# Patient Record
Sex: Male | Born: 1935
Health system: Southern US, Community
[De-identification: ages and names within clinical notes are randomized; demographics above are authoritative.]

## PROBLEM LIST (undated history)

## (undated) DIAGNOSIS — N400 Enlarged prostate without lower urinary tract symptoms: Secondary | ICD-10-CM

## (undated) DIAGNOSIS — E785 Hyperlipidemia, unspecified: Secondary | ICD-10-CM

## (undated) DIAGNOSIS — C801 Malignant (primary) neoplasm, unspecified: Secondary | ICD-10-CM

## (undated) DIAGNOSIS — J189 Pneumonia, unspecified organism: Secondary | ICD-10-CM

## (undated) DIAGNOSIS — T7840XA Allergy, unspecified, initial encounter: Secondary | ICD-10-CM

## (undated) DIAGNOSIS — I10 Essential (primary) hypertension: Secondary | ICD-10-CM

## (undated) DIAGNOSIS — M1990 Unspecified osteoarthritis, unspecified site: Secondary | ICD-10-CM

## (undated) HISTORY — DX: Benign prostatic hyperplasia without lower urinary tract symptoms: N40.0

## (undated) HISTORY — DX: Hyperlipidemia, unspecified: E78.5

## (undated) HISTORY — DX: Allergy, unspecified, initial encounter: T78.40XA

## (undated) HISTORY — PX: TONSILLECTOMY AND ADENOIDECTOMY: SUR1326

## (undated) HISTORY — PX: VENTRAL HERNIA REPAIR: SHX424

## (undated) HISTORY — DX: Essential (primary) hypertension: I10

## (undated) HISTORY — PX: PROSTATE BIOPSY: SHX241

## (undated) HISTORY — PX: COLONOSCOPY: SHX174

## (undated) HISTORY — PX: CATARACT EXTRACTION W/ INTRAOCULAR LENS IMPLANT: SHX1309

---

## 1996-11-14 ENCOUNTER — Encounter (INDEPENDENT_AMBULATORY_CARE_PROVIDER_SITE_OTHER): Payer: Self-pay | Admitting: Internal Medicine

## 1996-11-14 LAB — CONVERTED CEMR LAB: PSA: 0.5 ng/mL

## 1998-10-15 ENCOUNTER — Encounter (INDEPENDENT_AMBULATORY_CARE_PROVIDER_SITE_OTHER): Payer: Self-pay | Admitting: Internal Medicine

## 2000-07-16 ENCOUNTER — Encounter (INDEPENDENT_AMBULATORY_CARE_PROVIDER_SITE_OTHER): Payer: Self-pay | Admitting: Internal Medicine

## 2000-07-16 LAB — CONVERTED CEMR LAB: Hgb A1c MFr Bld: 8 %

## 2001-04-16 ENCOUNTER — Encounter (INDEPENDENT_AMBULATORY_CARE_PROVIDER_SITE_OTHER): Payer: Self-pay | Admitting: Internal Medicine

## 2001-04-16 LAB — CONVERTED CEMR LAB: Hgb A1c MFr Bld: 6.5 %

## 2002-02-13 ENCOUNTER — Encounter (INDEPENDENT_AMBULATORY_CARE_PROVIDER_SITE_OTHER): Payer: Self-pay | Admitting: Internal Medicine

## 2002-02-13 LAB — CONVERTED CEMR LAB: Hgb A1c MFr Bld: 6.3 %

## 2002-06-16 ENCOUNTER — Encounter (INDEPENDENT_AMBULATORY_CARE_PROVIDER_SITE_OTHER): Payer: Self-pay | Admitting: Internal Medicine

## 2002-06-16 LAB — CONVERTED CEMR LAB: Hgb A1c MFr Bld: 6.3 %

## 2002-12-15 ENCOUNTER — Encounter (INDEPENDENT_AMBULATORY_CARE_PROVIDER_SITE_OTHER): Payer: Self-pay | Admitting: Internal Medicine

## 2003-05-17 ENCOUNTER — Encounter (INDEPENDENT_AMBULATORY_CARE_PROVIDER_SITE_OTHER): Payer: Self-pay | Admitting: Internal Medicine

## 2003-08-17 ENCOUNTER — Encounter (INDEPENDENT_AMBULATORY_CARE_PROVIDER_SITE_OTHER): Payer: Self-pay | Admitting: Internal Medicine

## 2003-09-17 ENCOUNTER — Encounter (INDEPENDENT_AMBULATORY_CARE_PROVIDER_SITE_OTHER): Payer: Self-pay | Admitting: Internal Medicine

## 2003-09-17 LAB — CONVERTED CEMR LAB: Microalbumin U total vol: 2.1 mg/L

## 2003-12-15 ENCOUNTER — Encounter (INDEPENDENT_AMBULATORY_CARE_PROVIDER_SITE_OTHER): Payer: Self-pay | Admitting: Internal Medicine

## 2004-06-16 ENCOUNTER — Encounter (INDEPENDENT_AMBULATORY_CARE_PROVIDER_SITE_OTHER): Payer: Self-pay | Admitting: Internal Medicine

## 2004-06-16 LAB — CONVERTED CEMR LAB: Hgb A1c MFr Bld: 6.5 %

## 2004-06-19 ENCOUNTER — Ambulatory Visit: Payer: Self-pay | Admitting: Internal Medicine

## 2004-07-27 ENCOUNTER — Ambulatory Visit: Payer: Self-pay | Admitting: Family Medicine

## 2004-10-14 ENCOUNTER — Encounter (INDEPENDENT_AMBULATORY_CARE_PROVIDER_SITE_OTHER): Payer: Self-pay | Admitting: Internal Medicine

## 2004-10-27 ENCOUNTER — Ambulatory Visit: Payer: Self-pay | Admitting: Family Medicine

## 2004-12-02 ENCOUNTER — Ambulatory Visit: Payer: Self-pay | Admitting: Family Medicine

## 2005-03-03 ENCOUNTER — Ambulatory Visit: Payer: Self-pay | Admitting: Family Medicine

## 2005-04-05 ENCOUNTER — Ambulatory Visit: Payer: Self-pay | Admitting: Family Medicine

## 2005-05-25 ENCOUNTER — Ambulatory Visit: Payer: Self-pay | Admitting: Family Medicine

## 2005-06-17 ENCOUNTER — Ambulatory Visit: Payer: Self-pay

## 2005-07-19 ENCOUNTER — Ambulatory Visit: Payer: Self-pay | Admitting: Family Medicine

## 2005-09-16 ENCOUNTER — Encounter (INDEPENDENT_AMBULATORY_CARE_PROVIDER_SITE_OTHER): Payer: Self-pay | Admitting: Internal Medicine

## 2005-09-16 LAB — CONVERTED CEMR LAB: PSA: 0.42 ng/mL

## 2005-09-21 ENCOUNTER — Ambulatory Visit: Payer: Self-pay | Admitting: Family Medicine

## 2005-10-14 ENCOUNTER — Encounter (INDEPENDENT_AMBULATORY_CARE_PROVIDER_SITE_OTHER): Payer: Self-pay | Admitting: Internal Medicine

## 2005-10-14 LAB — CONVERTED CEMR LAB: Hgb A1c MFr Bld: 6.5 %

## 2005-10-21 ENCOUNTER — Ambulatory Visit: Payer: Self-pay | Admitting: Family Medicine

## 2006-01-17 ENCOUNTER — Ambulatory Visit: Payer: Self-pay | Admitting: Family Medicine

## 2006-02-23 ENCOUNTER — Ambulatory Visit: Payer: Self-pay | Admitting: Family Medicine

## 2006-06-16 ENCOUNTER — Encounter (INDEPENDENT_AMBULATORY_CARE_PROVIDER_SITE_OTHER): Payer: Self-pay | Admitting: Internal Medicine

## 2006-06-16 LAB — CONVERTED CEMR LAB: Microalbumin U total vol: 11.3 mg/L

## 2006-07-19 ENCOUNTER — Ambulatory Visit: Payer: Self-pay | Admitting: Internal Medicine

## 2006-10-20 ENCOUNTER — Encounter (INDEPENDENT_AMBULATORY_CARE_PROVIDER_SITE_OTHER): Payer: Self-pay | Admitting: Internal Medicine

## 2006-10-20 DIAGNOSIS — E1149 Type 2 diabetes mellitus with other diabetic neurological complication: Secondary | ICD-10-CM | POA: Insufficient documentation

## 2006-10-20 DIAGNOSIS — E1169 Type 2 diabetes mellitus with other specified complication: Secondary | ICD-10-CM | POA: Insufficient documentation

## 2006-10-20 DIAGNOSIS — I10 Essential (primary) hypertension: Secondary | ICD-10-CM | POA: Insufficient documentation

## 2006-10-20 DIAGNOSIS — E785 Hyperlipidemia, unspecified: Secondary | ICD-10-CM

## 2006-11-17 ENCOUNTER — Ambulatory Visit: Payer: Self-pay | Admitting: Family Medicine

## 2007-01-19 ENCOUNTER — Ambulatory Visit: Payer: Self-pay | Admitting: Family Medicine

## 2007-01-20 LAB — CONVERTED CEMR LAB
ALT: 18 units/L (ref 0–40)
AST: 29 units/L (ref 0–37)
BUN: 19 mg/dL (ref 6–23)
CO2: 26 meq/L (ref 19–32)
Calcium: 9.3 mg/dL (ref 8.4–10.5)
Chloride: 105 meq/L (ref 96–112)
Creatinine, Ser: 1.3 mg/dL (ref 0.4–1.5)
Hgb A1c MFr Bld: 7 % — ABNORMAL HIGH (ref 4.6–6.0)
PSA: 0.36 ng/mL (ref 0.10–4.00)
Potassium: 3.9 meq/L (ref 3.5–5.1)
TSH: 0.51 microintl units/mL (ref 0.35–5.50)
Triglycerides: 71 mg/dL (ref 0–149)
VLDL: 14 mg/dL (ref 0–40)

## 2007-07-26 ENCOUNTER — Ambulatory Visit: Payer: Self-pay | Admitting: Family Medicine

## 2007-07-26 DIAGNOSIS — K5900 Constipation, unspecified: Secondary | ICD-10-CM | POA: Insufficient documentation

## 2007-07-26 DIAGNOSIS — K5909 Other constipation: Secondary | ICD-10-CM | POA: Insufficient documentation

## 2007-07-27 LAB — CONVERTED CEMR LAB
ALT: 20 units/L (ref 0–53)
AST: 24 units/L (ref 0–37)
BUN: 16 mg/dL (ref 6–23)
CO2: 28 meq/L (ref 19–32)
Calcium: 9.5 mg/dL (ref 8.4–10.5)
Chloride: 105 meq/L (ref 96–112)
Cholesterol: 160 mg/dL (ref 0–200)
Creatinine, Ser: 1.3 mg/dL (ref 0.4–1.5)
GFR calc Af Amer: 70 mL/min
Glucose, Bld: 148 mg/dL — ABNORMAL HIGH (ref 70–99)

## 2007-08-08 ENCOUNTER — Ambulatory Visit: Payer: Self-pay | Admitting: Family Medicine

## 2007-08-08 DIAGNOSIS — H6120 Impacted cerumen, unspecified ear: Secondary | ICD-10-CM | POA: Insufficient documentation

## 2007-08-21 ENCOUNTER — Ambulatory Visit: Payer: Self-pay | Admitting: Gastroenterology

## 2007-08-31 ENCOUNTER — Encounter (INDEPENDENT_AMBULATORY_CARE_PROVIDER_SITE_OTHER): Payer: Self-pay | Admitting: Internal Medicine

## 2007-08-31 ENCOUNTER — Encounter: Payer: Self-pay | Admitting: Gastroenterology

## 2007-08-31 ENCOUNTER — Ambulatory Visit: Payer: Self-pay | Admitting: Gastroenterology

## 2007-09-04 DIAGNOSIS — D126 Benign neoplasm of colon, unspecified: Secondary | ICD-10-CM | POA: Insufficient documentation

## 2007-10-31 ENCOUNTER — Ambulatory Visit: Payer: Self-pay | Admitting: Family Medicine

## 2007-11-02 LAB — CONVERTED CEMR LAB
Creatinine,U: 104 mg/dL
Hgb A1c MFr Bld: 6.6 % — ABNORMAL HIGH (ref 4.6–6.0)
Microalb Creat Ratio: 24 mg/g (ref 0.0–30.0)

## 2007-11-10 ENCOUNTER — Ambulatory Visit: Payer: Self-pay | Admitting: Family Medicine

## 2008-04-30 ENCOUNTER — Ambulatory Visit: Payer: Self-pay | Admitting: Family Medicine

## 2008-04-30 DIAGNOSIS — J309 Allergic rhinitis, unspecified: Secondary | ICD-10-CM | POA: Insufficient documentation

## 2008-05-01 LAB — CONVERTED CEMR LAB
ALT: 23 units/L (ref 0–53)
AST: 23 units/L (ref 0–37)
CO2: 29 meq/L (ref 19–32)
Calcium: 9.4 mg/dL (ref 8.4–10.5)
GFR calc Af Amer: 85 mL/min
Glucose, Bld: 138 mg/dL — ABNORMAL HIGH (ref 70–99)
HDL: 47.9 mg/dL (ref >39.0)
Hgb A1c MFr Bld: 6.9 % — ABNORMAL HIGH (ref 4.6–6.0)
Potassium: 4.2 meq/L (ref 3.5–5.1)
Sodium: 139 meq/L (ref 135–145)
Total CHOL/HDL Ratio: 2.5

## 2008-06-13 ENCOUNTER — Ambulatory Visit: Payer: Self-pay | Admitting: Family Medicine

## 2008-10-30 ENCOUNTER — Ambulatory Visit: Payer: Self-pay | Admitting: Family Medicine

## 2008-11-01 ENCOUNTER — Encounter (INDEPENDENT_AMBULATORY_CARE_PROVIDER_SITE_OTHER): Payer: Self-pay | Admitting: Internal Medicine

## 2008-11-01 ENCOUNTER — Telehealth (INDEPENDENT_AMBULATORY_CARE_PROVIDER_SITE_OTHER): Payer: Self-pay | Admitting: Internal Medicine

## 2008-11-05 ENCOUNTER — Ambulatory Visit: Payer: Self-pay | Admitting: Family Medicine

## 2008-11-06 LAB — CONVERTED CEMR LAB
AST: 23 units/L (ref 0–37)
LDL Cholesterol: 85 mg/dL (ref 0–99)
Potassium: 4.4 meq/L (ref 3.5–5.1)
Sodium: 139 meq/L (ref 135–145)
Total CHOL/HDL Ratio: 3

## 2008-11-28 ENCOUNTER — Telehealth (INDEPENDENT_AMBULATORY_CARE_PROVIDER_SITE_OTHER): Payer: Self-pay | Admitting: Internal Medicine

## 2009-03-03 ENCOUNTER — Ambulatory Visit: Payer: Self-pay | Admitting: Family Medicine

## 2009-03-07 ENCOUNTER — Ambulatory Visit: Payer: Self-pay | Admitting: Family Medicine

## 2009-07-02 ENCOUNTER — Telehealth (INDEPENDENT_AMBULATORY_CARE_PROVIDER_SITE_OTHER): Payer: Self-pay | Admitting: Internal Medicine

## 2009-07-09 ENCOUNTER — Encounter (INDEPENDENT_AMBULATORY_CARE_PROVIDER_SITE_OTHER): Payer: Self-pay | Admitting: *Deleted

## 2009-07-22 ENCOUNTER — Ambulatory Visit: Payer: Self-pay | Admitting: Family Medicine

## 2009-07-23 LAB — CONVERTED CEMR LAB
ALT: 22 units/L (ref 0–53)
HDL: 58.4 mg/dL (ref >39.00)
LDL Cholesterol: 80 mg/dL (ref 0–99)
Total CHOL/HDL Ratio: 3
VLDL: 15.8 mg/dL (ref 0.0–40.0)

## 2009-07-25 ENCOUNTER — Ambulatory Visit: Payer: Self-pay | Admitting: Family Medicine

## 2009-07-25 ENCOUNTER — Telehealth: Payer: Self-pay | Admitting: Internal Medicine

## 2010-01-22 ENCOUNTER — Ambulatory Visit: Payer: Self-pay | Admitting: Internal Medicine

## 2010-01-23 LAB — CONVERTED CEMR LAB
AST: 4 units/L (ref 0–37)
BUN: 16 mg/dL (ref 6–23)
Bilirubin Urine: NEGATIVE
Creatinine, Ser: 1.1 mg/dL (ref 0.4–1.5)
GFR calc non Af Amer: 70.2 mL/min (ref >60)
Glucose, Bld: 128 mg/dL — ABNORMAL HIGH (ref 70–99)
Hemoglobin, Urine: NEGATIVE
LDL Cholesterol: 92 mg/dL (ref 0–99)
Microalb, Ur: 1.4 mg/dL (ref 0.0–1.9)
PSA: 48.54 ng/mL — ABNORMAL HIGH (ref 0.10–4.00)
Total CHOL/HDL Ratio: 3
Total Protein, Urine: NEGATIVE mg/dL
Urine Glucose: NEGATIVE mg/dL
pH: 5 (ref 5.0–8.0)

## 2010-01-30 ENCOUNTER — Ambulatory Visit: Payer: Self-pay | Admitting: Internal Medicine

## 2010-01-30 DIAGNOSIS — N400 Enlarged prostate without lower urinary tract symptoms: Secondary | ICD-10-CM | POA: Insufficient documentation

## 2010-02-17 ENCOUNTER — Encounter: Payer: Self-pay | Admitting: Internal Medicine

## 2010-03-12 ENCOUNTER — Ambulatory Visit: Payer: Self-pay | Admitting: Internal Medicine

## 2010-03-16 LAB — CONVERTED CEMR LAB
ALT: 18 units/L (ref 0–53)
Albumin: 4.2 g/dL (ref 3.5–5.2)
Bilirubin, Direct: 0.1 mg/dL (ref 0.0–0.3)
Cholesterol: 178 mg/dL (ref 0–200)
HDL: 54.9 mg/dL (ref >39.00)
LDL Cholesterol: 94 mg/dL (ref 0–99)
Total Protein: 6.5 g/dL (ref 6.0–8.3)
VLDL: 28.8 mg/dL (ref 0.0–40.0)

## 2010-04-13 ENCOUNTER — Encounter: Payer: Self-pay | Admitting: Internal Medicine

## 2010-07-21 ENCOUNTER — Encounter: Payer: Self-pay | Admitting: Internal Medicine

## 2010-07-24 ENCOUNTER — Ambulatory Visit: Payer: Self-pay | Admitting: Internal Medicine

## 2010-07-24 LAB — HM DIABETES FOOT EXAM

## 2010-07-25 LAB — CONVERTED CEMR LAB
Albumin: 4.2 g/dL (ref 3.5–5.2)
Basophils Relative: 0.6 % (ref 0.0–3.0)
Chloride: 101 meq/L (ref 96–112)
Eosinophils Relative: 1.8 % (ref 0.0–5.0)
GFR calc non Af Amer: 70.11 mL/min (ref >60.00)
Hemoglobin: 14.1 g/dL (ref 13.0–17.0)
Lymphocytes Relative: 41.5 % (ref 12.0–46.0)
Monocytes Relative: 10.1 % (ref 3.0–12.0)
Neutro Abs: 2.8 10*3/uL (ref 1.4–7.7)
Phosphorus: 2.8 mg/dL (ref 2.3–4.6)
Potassium: 4.1 meq/L (ref 3.5–5.1)
RBC: 4.31 M/uL (ref 4.22–5.81)
WBC: 6 10*3/uL (ref 4.5–10.5)

## 2010-07-29 ENCOUNTER — Encounter: Payer: Self-pay | Admitting: Internal Medicine

## 2010-07-29 LAB — HM DIABETES EYE EXAM

## 2010-08-11 ENCOUNTER — Ambulatory Visit
Admission: RE | Admit: 2010-08-11 | Discharge: 2010-08-11 | Payer: Self-pay | Source: Home / Self Care | Attending: Internal Medicine | Admitting: Internal Medicine

## 2010-09-16 NOTE — Assessment & Plan Note (Signed)
Summary: 6 MONTH FOLLOW UP/[facility]   Vital Signs:  Patient profile:   77 year old Sanford Weight:      185 pounds BMI:     28.65 Temp:     97.9 degrees F oral Pulse rate:   76 / minute Pulse rhythm:   regular BP sitting:   150 / 70  (left arm) Cuff size:   large  Vitals Entered By: Mervin Hack CMA Duncan Dull) (July 24, 2010 11:42 AM) CC: 6 month follow-up   History of Present Illness: Had prostate biopsy which was negative PSA now down will see him again in 6 months  Recently in [place] with son and his family forgot last visit after that  DOesn't check his sugars No apparent hypoglycemic reactions No apparent hypoglycemic reactions  No chest pain  No SOB No edema  still on singulair this time of year had balance problems without it had vertigo  Allergies: No Known Drug Allergies  Past History:  Past medical, surgical, family and social histories (including risk factors) reviewed for relevance to current acute and chronic problems.  Past Medical History: Reviewed history from 01/30/2010 and no changes required. Diabetes mellitus, type II: 07/2000 Hyperlipidemia Hypertension:  02/1991 Benign prostatic hypertrophy Allergic rhinitis  Past Surgical History: ventral hernia, periumbilical Tonsillectomy:  1950's adnoidectomy: 1950's colonoscopy--1/09--polyp 8/11 Prostate biopsy negative-- Dr Alexia Freestone  Family History: Reviewed history from 01/30/2010 and no changes required. Father died with       MI, DM  Mother died of   MI Siblings: all brothers  had MIs               1 brother  with DM  Social History: Reviewed history from 01/30/2010 and no changes required. 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 No health care POA---son or daughter would accept resuscitation--but no prolonged artificial ventilation would not  want feeding tube if cognitively unaware  Review of Systems       weight is up 8#---was trying to beef up in case he had cancer No nocturia Stays active--yard,etc  Physical Exam  General:  alert and normal appearance.   Neck:  supple, no masses, no thyromegaly, and no cervical lymphadenopathy.   Lungs:  normal respiratory effort, no intercostal retractions, no accessory muscle use, and normal breath sounds.   Heart:  normal rate, regular rhythm, no murmur, and no gallop.   Pulses:  1+ in feet Extremities:  no edema Psych:  normally interactive, good eye contact, not anxious appearing, and not depressed appearing.    Diabetes Management Exam:    Foot Exam (with socks and/or shoes not present):       Sensory-Pinprick/Light touch:          Left medial foot (L-4): normal          Left dorsal foot (L-5): normal          Left lateral foot (S-1): normal          Right medial foot (L-4): normal          Right dorsal foot (L-5): normal          Right lateral foot (S-1): normal       Inspection:          Left foot: normal          Right foot: normal       Nails:  Left foot: normal          Right foot: normal   Impression & Recommendations:  Problem # 1:  DIABETES MELLITUS, TYPE II (ICD-250.00) Assessment Unchanged  seems to be doing fine will recheck levels  His updated medication list for this problem includes:    Lotrel 10-20 Mg Caps (Amlodipine besy-benazepril hcl) .Marland Kitchen... Take 1 capsule by mouth once a day    Metformin Hcl 500 Mg Tabs (Metformin hcl) .Marland Kitchen... 1 at breakfast  by mouth for diabetes    Aspirin 81 Mg Tbec (Aspirin) .Marland Kitchen... 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 Take 1 capsule by mouth once a day  BP today: 150/70 Prior BP: 138/60 (01/30/2010)  Labs Reviewed: K+: 4.6 (01/22/2010) Creat: : 1.1 (01/22/2010)   Chol: 178 (03/12/2010)   HDL: 54.90 (03/12/2010)   LDL: 94 (03/12/2010)   TG: 144.0 (03/12/2010)  Orders: TLB-Renal Function Panel (80069-RENAL) TLB-CBC Platelet - w/Differential (85025-CBCD) Venipuncture (40981)  Problem # 3:  HYPERLIPIDEMIA (ICD-272.4) Assessment: Unchanged doing fine on pravastatin  His updated medication list for this problem includes:    Pravastatin Sodium John Mg Tabs (Pravastatin sodium) .Marland Kitchen... 1 tab daily for high cholesterol    Slo-niacin 500 Mg Cr-tabs (Niacin) .Marland Kitchen... 1 daily by mouth  Labs Reviewed: SGOT: 20 (03/12/2010)   SGPT: 18 (03/12/2010)   HDL:54.90 (03/12/2010), 62.10 (01/22/2010)  LDL:94 (03/12/2010), 92 (01/22/2010)  Chol:178 (03/12/2010), 165 (01/22/2010)  Trig:144.0 (03/12/2010), 55.0 (01/22/2010)  Problem # 4:  BENIGN PROSTATIC HYPERTROPHY (ICD-600.00) Assessment: Improved biopsy negative voiding fine  Complete Medication List: 1)  Pravastatin Sodium John Mg Tabs (Pravastatin sodium) .Marland Kitchen.. 1 tab daily for high cholesterol 2)  Lotrel 10-20 Mg Caps (Amlodipine besy-benazepril hcl) .... Take 1 capsule by mouth once a day 3)  Metformin Hcl 500 Mg Tabs (Metformin hcl) .Marland Kitchen.. 1 at breakfast  by mouth for diabetes 4)  Singulair 10 Mg Tabs (Montelukast sodium) .Marland Kitchen.. 1 once daily for congestion by mouth 5)  Senna S 8.6-50 Mg Tabs (Sennosides-docusate sodium) .... As needed 6)  Slo-niacin 500 Mg Cr-tabs (Niacin) .Marland Kitchen.. 1 daily by mouth 7)  Aspirin 81 Mg Tbec (Aspirin) .... Take 1 tablet by mouth once a day by mouth  Other Orders: Flu Vaccine 51yrs + MEDICARE PATIENTS (X9147) Administration Flu vaccine - MCR (W2956) Pneumococcal Vaccine  (21308) Admin 1st Vaccine (65784)  Patient Instructions: 1)  Please schedule a follow-up appointment in 6 months for Medicare Wellness visit   Orders Added: 1)  Est. Patient Level IV [69629] 2)  TLB-Renal Function Panel [80069-RENAL] 3)  TLB-CBC Platelet - w/Differential [85025-CBCD] 4)  Venipuncture [36415] 5)  TLB-A1C / Hgb A1C (Glycohemoglobin) [83036-A1C] 6)  Flu Vaccine 54yrs + MEDICARE PATIENTS [Q2039] 7)  Administration Flu vaccine - MCR [G0008] 8)  Pneumococcal Vaccine [90732] 9)  Admin 1st Vaccine [52841]   Immunizations Administered:  Pneumonia Vaccine:    Vaccine Type: Pneumovax (Medicare)    Site: right deltoid    Mfr: Merck    Dose: 0.5 ml    Route: IM    Given by: Mervin Hack CMA (AAMA)  Exp. Date: 12/11/2011    Lot #: [id number]    VIS given: 07/21/09 version given July 24, 2010.   Immunizations Administered:  Pneumonia Vaccine:    Vaccine Type: Pneumovax (Medicare)    Site: right deltoid    Mfr: Merck    Dose: 0.5 ml    Route: IM    Given by: Mervin Hack CMA (AAMA)    Exp. Date: 12/11/2011    Lot #: [id number]    VIS given: 07/21/09 version given July 24, 2010.  Current Allergies (reviewed today): No known allergies Flu Vaccine Consent Questions     Do you have a history of severe allergic reactions to this vaccine? no    Any prior history of allergic reactions to egg and/or gelatin? no    Do you have a sensitivity to the preservative Thimersol? no    Do you have a past history of Guillan-Barre Syndrome? no    Do you currently have an acute febrile illness? no    Have you ever had a severe reaction to latex? no    Vaccine information given and explained to patient? yes    Are you currently pregnant? no    Lot Number:[id number]   Exp Date:02/13/2011   Site Given  Left Deltoid IM      .[email or web address]

## 2010-09-16 NOTE — Consult Note (Signed)
Summary: [facility]  [facility]   Imported By: Lanelle Bal 02/25/2010 12:45:35  _____________________________________________________________________  External Attachment:    Type:   Image     Comment:   External Document  Appended Document: [facility] planning prostate biopsy

## 2010-09-16 NOTE — Assessment & Plan Note (Signed)
Summary: F/U AFTER LABS / LFW   Vital Signs:  Patient profile:   77 year old male Weight:      177 pounds Temp:     98.5 degrees F oral Pulse rate:   76 / minute Pulse rhythm:   regular BP sitting:   138 / 60  (left arm) Cuff size:   large  Vitals Entered By: Mervin Hack CMA Duncan Dull) (January 30, 2010 8:55 AM) CC: 6 month follow-up   History of Present Illness: Feels okay in general  Had referred for urology eval due to rapid rise in PSA does have some urinary symptoms---has increased freq if he is standing (better if sitting) Only nocturia x 1 no dysuria  doesn't check sugars no hypoglycemic spells on metformin for  ~6 months Plans to go for eye exam  No chest paiin no SOB no edema No change in exercise tolerance----active with tillers, cuts wood  Does get cramps but no prolonged myalgias   Preventive Screening-Counseling & Management  Alcohol-Tobacco     Smoking Status: quit  Allergies: No Known Drug Allergies  Past History:  Past medical, surgical, family and social histories (including risk factors) reviewed for relevance to current acute and chronic problems.  Past Medical History: Diabetes mellitus, type II: 07/2000 Hyperlipidemia Hypertension:  02/1991 Benign prostatic hypertrophy Allergic rhinitis  Past Surgical History: Reviewed history from 09/08/2007 and no changes required. 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 No health care POA---son or daughter would accept resuscitation--but no prolonged artificial ventilation would not want feeding  tube if cognitively unaware  Review of Systems       Eats voraciously weight down 3-4# sleeps well  Physical Exam  General:  alert and normal appearance.   Neck:  supple, no masses, no thyromegaly, no carotid bruits, and no cervical lymphadenopathy.   Lungs:  normal respiratory effort and normal breath sounds.   Heart:  normal rate, regular rhythm, no murmur, and no gallop.   Pulses:  1+ in feet Extremities:  no edema Skin:  no suspicious lesions and no ulcerations.   Psych:  normally interactive, good eye contact, not anxious appearing, and not depressed appearing.    Diabetes Management Exam:    Foot Exam (with socks and/or shoes not present):       Sensory-Pinprick/Light touch:          Left medial foot (L-4): normal          Left dorsal foot (L-5): normal          Left lateral foot (S-1): normal          Right medial foot (L-4): normal          Right dorsal foot (L-5): normal          Right lateral foot (S-1): normal       Inspection:          Left foot: abnormal             Comments: early bunion slight callous  Right foot: abnormal             Comments: early  bunion healing blister betweein 1st 2 toes       Nails:          Left foot: thickened          Right foot: thickened   Impression & Recommendations:  Problem # 1:  DIABETES MELLITUS, TYPE II (ICD-250.00) Assessment Unchanged excellant control no changes needed  His updated medication list for this problem includes:    Lotrel 10-20 Mg Caps (Amlodipine besy-benazepril hcl) .Marland Kitchen... Take 1 capsule by mouth once a day    Metformin Hcl 500 Mg Tabs (Metformin hcl) .Marland Kitchen... 1 at breakfast  by mouth for diabetes    Aspirin 81 Mg Tbec (Aspirin) .Marland Kitchen... Take 1 tablet by mouth once a day by mouth  Labs Reviewed: Creat: 1.1 (01/22/2010)   Microalbumin: 11.3 (06/16/2006) Reviewed HgBA1c results: 6.7 (01/22/2010)  6.9 (07/22/2009)  Problem # 2:  HYPERTENSION (ICD-401.9) Assessment: Unchanged good control no  changes needed  His updated medication list for this problem includes:    Lotrel 10-20 Mg Caps (Amlodipine besy-benazepril hcl) .Marland Kitchen... Take 1 capsule by mouth once a day  BP today: 138/60 Prior BP: 130/68 (07/25/2009)  Labs Reviewed: K+: 4.6 (01/22/2010) Creat: : 1.1 (01/22/2010)   Chol: 165 (01/22/2010)   HDL: 62.10 (01/22/2010)   LDL: 92 (01/22/2010)   TG: 55.0 (01/22/2010)  Problem # 3:  PSA, INCREASED (ICD-790.93) Assessment: Comment Only has urology appt 7/5 discussed that watchful waiting may be appropriate at his age  Problem # 4:  HYPERLIPIDEMIA (ICD-272.4) Assessment: Unchanged no myalgia but on amlodipine will change to pravastatin  The following medications were removed from the medication list:    Simvastatin 80 Mg Tabs (Simvastatin) .Marland Kitchen... Take 1 tablet by mouth at bedtime His updated medication list for this problem includes:    Slo-niacin 500 Mg Cr-tabs (Niacin) .Marland Kitchen... 1 daily by mouth    Pravastatin Sodium 80 Mg Tabs (Pravastatin sodium) .Marland Kitchen... 1 tab daily for high cholesterol  Labs Reviewed: SGOT: 4 (01/22/2010)   SGPT: 4 (01/22/2010)   HDL:62.10 (01/22/2010), 58.40 (07/22/2009)  LDL:92 (01/22/2010), 80 (07/22/2009)  Chol:165 (01/22/2010), 154 (07/22/2009)  Trig:55.0 (01/22/2010), 79.0 (07/22/2009)  Complete Medication List: 1)  Lotrel 10-20 Mg Caps (Amlodipine besy-benazepril hcl) .... Take 1 capsule by mouth once a day 2)  Metformin Hcl 500 Mg Tabs (Metformin hcl) .Marland Kitchen.. 1 at breakfast  by mouth for diabetes 3)  Senna S 8.6-50 Mg Tabs (Sennosides-docusate sodium) .... As needed 4)  Slo-niacin 500 Mg Cr-tabs (Niacin) .Marland Kitchen.. 1 daily by mouth 5)  Aspirin 81 Mg Tbec (Aspirin) .... Take 1 tablet by mouth once a day by mouth 6)  Singulair 10 Mg Tabs (Montelukast sodium) .Marland Kitchen.. 1 once daily for congestion by mouth 7)  Pravastatin Sodium 80 Mg Tabs (Pravastatin sodium) .Marland Kitchen.. 1 tab daily for high cholesterol  Patient Instructions: 1)  Please finish the simvastatin and stop.  Then start the pravastatin daily 2)  Return in 1 month for repeat blood work (lipid, hepatic-272.4) 3)  Please schedule a follow-up appointment in 6 months .  Prescriptions: METFORMIN HCL 500 MG TABS (METFORMIN HCL) 1 at breakfast  by mouth for diabetes  #90 x 3   Entered and Authorized by:   Cindee Salt MD   Signed by:   Cindee Salt MD on 01/30/2010   Method used:   Electronically to        [facility]* (retail)  [address]       [place], [place]  [address]       Ph: [phone]       Fax: [phone]   RxID:   [id number] PRAVASTATIN SODIUM 80 MG TABS (PRAVASTATIN SODIUM) 1 tab daily for high cholesterol  #90 x 3   Entered and Authorized by:   Cindee Salt MD   Signed by:   Cindee Salt MD on 01/30/2010   Method used:   Electronically to        [facility]* (retail)       [address]       [place], [place]  [address]       Ph: [phone]       Fax: [phone]   RxID:   [id number]   Current Allergies (reviewed today): No known allergies

## 2010-09-16 NOTE — Letter (Signed)
Summary: [facility]  [facility]   Imported By: Lanelle Bal 04/22/2010 14:20:00  _____________________________________________________________________  External Attachment:    Type:   Image     Comment:   External Document  Appended Document: [facility] BIopsy fortunately shows no cancer May consider antibiotics for possible infection

## 2010-09-17 NOTE — Letter (Signed)
Summary: [facility]  [facility]   Imported By: Lanelle Bal 07/30/2010 12:54:01  _____________________________________________________________________  External Attachment:    Type:   Image     Comment:   External Document  Appended Document: [facility] PSA has remained down checking again but no further eval planned

## 2010-09-17 NOTE — Assessment & Plan Note (Signed)
Summary: CUT ON HAND- WALK IN / lb   Vital Signs:  Patient profile:   77 year old male Height:      67.5 inches Weight:      186 pounds BMI:     28.81 Temp:     98.4 degrees F oral Pulse rate:   88 / minute Pulse rhythm:   regular BP sitting:   120 / 60  (left arm) Cuff size:   large  Vitals Entered By: Delilah Shan CMA Duncan Dull) (August 11, 2010 1:13 PM) CC: Cut on hand.  dT booster 02/13/2006   History of Present Illness: Cut left hand yesterday trying to open plastic part of Cool Whip container tried some bandaids but came apart Occurred 5PM yesterday No pain  No sig bleeding--no problems with hemostasis  Allergies: No Known Drug Allergies  Past History:  Past medical, surgical, family and social histories (including risk factors) reviewed for relevance to current acute and chronic problems.  Past Medical History: Reviewed history from 01/30/2010 and no changes required. Diabetes mellitus, type II: 07/2000 Hyperlipidemia Hypertension:  02/1991 Benign prostatic hypertrophy Allergic rhinitis  Past Surgical History: Reviewed history from 07/24/2010 and no changes required. ventral hernia, periumbilical Tonsillectomy:  1950's adnoidectomy: 1950's colonoscopy--1/09--polyp 8/11 Prostate biopsy negative-- Dr Alexia Freestone  Family History: Reviewed history from 01/30/2010 and no changes required. Father died with       MI, DM  Mother died of   MI Siblings: all brothers  had MIs               1 brother  with DM  Social History: Reviewed history from 01/30/2010 and no changes required. 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 No health care POA---son or daughter would accept resuscitation--but no prolonged artificial ventilation would not want feeding tube if cognitively unaware  Review of Systems       No trouble with ROM  or use of left hand  Physical Exam  General:  alert.  NAD Skin:  transverse laceration across left palm from thumb to mid palm Angled but not through all of dermis No redness or warmth   Impression & Recommendations:  Problem # 1:  OTH&UNS SUP INJURY HND NO FINGR ALONE W/O INF (ICD-914.8) Assessment New non infected almost 18 hours old so too late for primary repair  wound cleaned then dressed with steristrips (after benzoin) good wound apposition with some openings left discussed home care  Complete Medication List: 1)  Pravastatin Sodium 80 Mg Tabs (Pravastatin sodium) .Marland Kitchen.. 1 tab daily for high cholesterol 2)  Lotrel 10-20 Mg Caps (Amlodipine besy-benazepril hcl) .... Take 1 capsule by mouth once a day 3)  Metformin Hcl 500 Mg Tabs (Metformin hcl) .Marland Kitchen.. 1 at breakfast  by mouth for diabetes 4)  Singulair 10 Mg Tabs (Montelukast sodium) .Marland Kitchen.. 1 once daily for congestion by mouth 5)  Senna S 8.6-50 Mg Tabs (Sennosides-docusate sodium) .... As needed 6)  Slo-niacin 500 Mg Cr-tabs (Niacin) .Marland Kitchen.. 1 daily by mouth 7)  Aspirin 81 Mg Tbec (Aspirin) .... Take 1 tablet by mouth once a day by mouth  Patient Instructions: 1)  Please schedule a follow-up appointment as needed or for next regular appt   Orders Added: 1)  Est. Patient Level III [[address]]    Current Allergies (reviewed today): No known allergies

## 2010-09-17 NOTE — Letter (Signed)
Summary: [facility]  [facility]   Imported By: Lanelle Bal 08/07/2010 11:55:50  _____________________________________________________________________  External Attachment:    Type:   Image     Comment:   External Document  Appended Document: [facility]     Clinical Lists Changes  Observations: Added new observation of DIAB EYE EX: No diabetic retinopathy.    (07/29/2010 13:44)       Diabetic Eye Exam  Procedure date:  07/29/2010  Findings:      No diabetic retinopathy.

## 2011-01-23 ENCOUNTER — Encounter: Payer: Self-pay | Admitting: Internal Medicine

## 2011-01-25 ENCOUNTER — Encounter: Payer: Self-pay | Admitting: Internal Medicine

## 2011-01-25 ENCOUNTER — Other Ambulatory Visit: Payer: Self-pay | Admitting: *Deleted

## 2011-01-25 ENCOUNTER — Ambulatory Visit (INDEPENDENT_AMBULATORY_CARE_PROVIDER_SITE_OTHER): Payer: Medicare Other | Admitting: Internal Medicine

## 2011-01-25 DIAGNOSIS — I10 Essential (primary) hypertension: Secondary | ICD-10-CM

## 2011-01-25 DIAGNOSIS — E785 Hyperlipidemia, unspecified: Secondary | ICD-10-CM

## 2011-01-25 DIAGNOSIS — J309 Allergic rhinitis, unspecified: Secondary | ICD-10-CM

## 2011-01-25 DIAGNOSIS — E119 Type 2 diabetes mellitus without complications: Secondary | ICD-10-CM

## 2011-01-25 DIAGNOSIS — N400 Enlarged prostate without lower urinary tract symptoms: Secondary | ICD-10-CM

## 2011-01-25 LAB — BASIC METABOLIC PANEL
BUN: 19 mg/dL (ref 6–23)
CO2: 27 mEq/L (ref 19–32)
Chloride: 105 mEq/L (ref 96–112)
Creatinine, Ser: 1.3 mg/dL (ref 0.4–1.5)
Glucose, Bld: 140 mg/dL — ABNORMAL HIGH (ref 70–99)
Potassium: 4.5 mEq/L (ref 3.5–5.1)

## 2011-01-25 LAB — CBC WITH DIFFERENTIAL/PLATELET
Basophils Relative: 0.4 % (ref 0.0–3.0)
Eosinophils Relative: 1.2 % (ref 0.0–5.0)
Lymphocytes Relative: 26.7 % (ref 12.0–46.0)
Monocytes Absolute: 0.6 10*3/uL (ref 0.1–1.0)
Neutrophils Relative %: 63 % (ref 43.0–77.0)
Platelets: 223 10*3/uL (ref 150.0–400.0)
RBC: 3.89 Mil/uL — ABNORMAL LOW (ref 4.22–5.81)
WBC: 6.5 10*3/uL (ref 4.5–10.5)

## 2011-01-25 LAB — HEPATIC FUNCTION PANEL
ALT: 22 U/L (ref 0–53)
AST: 25 U/L (ref 0–37)
Albumin: 4.5 g/dL (ref 3.5–5.2)
Total Protein: 7.2 g/dL (ref 6.0–8.3)

## 2011-01-25 LAB — LIPID PANEL
Cholesterol: 190 mg/dL (ref 0–200)
LDL Cholesterol: 113 mg/dL — ABNORMAL HIGH (ref 0–99)
Triglycerides: 74 mg/dL (ref 0.0–149.0)

## 2011-01-25 LAB — HEMOGLOBIN A1C: Hgb A1c MFr Bld: 6.6 % — ABNORMAL HIGH (ref 4.6–6.5)

## 2011-01-25 MED ORDER — METFORMIN HCL 500 MG PO TABS: 500.0000 mg | ORAL_TABLET | Freq: Every day | ORAL | Status: DC

## 2011-01-25 NOTE — Assessment & Plan Note (Signed)
No problems with the med Lab Results  Component Value Date   LDLCALC 94 03/12/2010   Will recheck  LFTs again esp since taking some terbenafine

## 2011-01-25 NOTE — Assessment & Plan Note (Signed)
Voiding well No changes needed

## 2011-01-25 NOTE — Assessment & Plan Note (Signed)
Has done very well on the singulair Seems to prevent respiratory problems and cough

## 2011-01-25 NOTE — Assessment & Plan Note (Signed)
Seems to be doing well Doesn't check himself Lab Results  Component Value Date   HGBA1C 6.7* 07/24/2010   Will recheck

## 2011-01-25 NOTE — Progress Notes (Signed)
Subjective:    Patient ID: John Sanford, male    DOB: 06/25/1933, 77 y.o.   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 amLODipine-benazepril (LOTREL) 10-20 MG per capsule Take 1 capsule by mouth daily.        Marland Kitchen aspirin 81 MG tablet Take 81 mg by mouth daily.        . metFORMIN (GLUCOPHAGE) 500 MG tablet Take 500 mg by mouth daily with breakfast.        . montelukast (SINGULAIR) 10 MG tablet Take 10 mg by mouth at bedtime.        . pravastatin (PRAVACHOL) 80 MG tablet Take 80 mg by mouth daily.        Marland Kitchen DISCONTD: niacin (SLO-NIACIN) 500 MG tablet Take 500 mg by mouth at bedtime.        Marland Kitchen DISCONTD: sennosides-docusate sodium (SENOKOT-S) 8.6-50 MG tablet Take 1 tablet by mouth daily.         Past Medical History  Diagnosis Date  . Diabetes mellitus   . Hyperlipidemia   . Hypertension   . Allergy   . BPH (benign prostatic hypertrophy)     Past Surgical History  Procedure Date  . Ventral hernia repair     periumbilical  . Tonsillectomy and adenoidectomy   . Prostate biopsy     negative    Family History  Problem Relation Age of Onset  . Heart disease Mother   . Diabetes Father   . Heart disease Father   . Diabetes Brother   . Heart disease Brother     History   Social History  . Marital Status: Widowed    Spouse Name: N/A    Number of Children: 3  . Years of Education: N/A   Occupational History  . retired Naval architect    Social History Main Topics  . Smoking status: Former Smoker    Quit date: 08/16/1978  . Smokeless tobacco: Not on file  . Alcohol Use: Yes     rare  . Drug Use: Not on file  . Sexually  Active: Not on file   Other Topics Concern  . Not on file   Social History Narrative   Has living will. No health care POA---son or daughterwould accept resuscitation--but no prolonged artificial ventilationwould not want feeding tube if cognitively unaware   Review of Systems recnet biopsy on face by derm---precancerous. 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 No distress.  Neck: Normal range of motion. Neck supple. No thyromegaly present.  Cardiovascular: Normal rate, regular rhythm, normal heart sounds and intact distal pulses.  Exam reveals no gallop.   No murmur heard. Pulmonary/Chest: Effort normal and breath sounds normal. No respiratory distress. He has no wheezes. He has no rales.  Musculoskeletal: Normal range of motion. He exhibits no edema  and no tenderness.  Lymphadenopathy:    He has no cervical adenopathy.  Skin:       Mild fungal toenail left great toe Mild inflammation under staples in right knee--no discharge or tenderness  Psychiatric: He has a normal mood and affect. His behavior is normal. Judgment and thought content normal.          Assessment & Plan:

## 2011-01-25 NOTE — Assessment & Plan Note (Signed)
BP Readings from Last 3 Encounters:  01/25/11 150/70  08/11/10 120/60  07/24/10 150/70   Reasonable control No changes

## 2011-03-11 ENCOUNTER — Other Ambulatory Visit: Payer: Self-pay | Admitting: *Deleted

## 2011-03-11 MED ORDER — MONTELUKAST SODIUM 10 MG PO TABS: 10.0000 mg | ORAL_TABLET | Freq: Every day | ORAL | Status: DC

## 2011-03-11 NOTE — Telephone Encounter (Signed)
rx sent to pharmacy by e-script  

## 2011-05-03 ENCOUNTER — Other Ambulatory Visit: Payer: Self-pay | Admitting: *Deleted

## 2011-05-03 MED ORDER — AMLODIPINE BESY-BENAZEPRIL HCL 10-20 MG PO CAPS: 1.0000 | ORAL_CAPSULE | Freq: Every day | ORAL | Status: DC

## 2011-05-03 NOTE — Telephone Encounter (Signed)
rx sent to pharmacy by e-script  

## 2011-07-27 ENCOUNTER — Encounter: Payer: Self-pay | Admitting: *Deleted

## 2011-07-27 ENCOUNTER — Ambulatory Visit (INDEPENDENT_AMBULATORY_CARE_PROVIDER_SITE_OTHER): Payer: Medicare Other | Admitting: Internal Medicine

## 2011-07-27 VITALS — BP 139/64 | HR 72 | Temp 98.4°F | Ht 67.0 in | Wt 185.0 lb

## 2011-07-27 DIAGNOSIS — I10 Essential (primary) hypertension: Secondary | ICD-10-CM

## 2011-07-27 DIAGNOSIS — E119 Type 2 diabetes mellitus without complications: Secondary | ICD-10-CM

## 2011-07-27 DIAGNOSIS — Z23 Encounter for immunization: Secondary | ICD-10-CM

## 2011-07-27 DIAGNOSIS — N400 Enlarged prostate without lower urinary tract symptoms: Secondary | ICD-10-CM

## 2011-07-27 DIAGNOSIS — E785 Hyperlipidemia, unspecified: Secondary | ICD-10-CM

## 2011-07-27 MED ORDER — PRAVASTATIN SODIUM 80 MG PO TABS: 80.0000 mg | ORAL_TABLET | Freq: Every day | ORAL | Status: DC

## 2011-07-27 NOTE — Assessment & Plan Note (Signed)
Lab Results  Component Value Date   LDLCALC 113* 01/25/2011   Was under LDL last time Will recheck at next visit

## 2011-07-27 NOTE — Assessment & Plan Note (Signed)
BP Readings from Last 3 Encounters:  07/27/11 139/64  01/25/11 150/70  08/11/10 120/60   Good control Discussed trying to change ACEI due to throat tickle He wants to continue current Rx Lab Results  Component Value Date   CREATININE 1.3 01/25/2011

## 2011-07-27 NOTE — Assessment & Plan Note (Signed)
Lab Results  Component Value Date   HGBA1C 6.6* 01/25/2011   hoepfully still good control Will recheck

## 2011-07-27 NOTE — Assessment & Plan Note (Signed)
No problem lately

## 2011-07-27 NOTE — Progress Notes (Signed)
Subjective:    Patient ID: John Sanford, male    DOB: 09/05/1932, 77 y.o.   MRN: [id number]  HPI Doing well No new concerns Still on pravastatin No muscle aches or stomach trouble Discussed that LDL was over 100---under the time before  No set exercise Does keep up wood stove--lots of work  No chest pain No SOB No change in work tolerance Still works driving a Radiation protection practitioner sugars Tries to watch eating---no sugar and watches carbohydrates No hypoglycemic reactions Due for eye exam this month  Voids okay occ nocturia Done with urologist  Allergies and cough are controlled with the singulair Relates the cough to tickle in throat from BP med  No falls  Current Outpatient Prescriptions on File Prior to Visit  Medication Sig Dispense Refill  . amLODipine-benazepril (LOTREL) 10-20 MG per capsule Take 1 capsule by mouth daily.  30 capsule  11  . aspirin 81 MG tablet Take 81 mg by mouth daily.        . metFORMIN (GLUCOPHAGE) 500 MG tablet Take 1 tablet (500 mg total) by mouth daily with breakfast.  90 tablet  3  . montelukast (SINGULAIR) 10 MG tablet Take 1 tablet (10 mg total) by mouth at bedtime.  30 tablet  11  . pravastatin (PRAVACHOL) 80 MG tablet Take 80 mg by mouth daily.          No Known Allergies  Past Medical History  Diagnosis Date  . Diabetes mellitus   . Hyperlipidemia   . Hypertension   . Allergy   . BPH (benign prostatic hypertrophy)     Past Surgical History  Procedure Date  . Ventral hernia repair     periumbilical  . Tonsillectomy and adenoidectomy   . Prostate biopsy     negative    Family History  Problem Relation Age of Onset  . Heart disease Mother   . Diabetes Father   . Heart disease Father   . Diabetes Brother   . Heart disease Brother     History   Social History  . Marital Status: Widowed    Spouse Name: N/A    Number of Children: 3  . Years of Education: N/A   Occupational History  . retired Ecologist    Social History Main Topics  . Smoking status: Former Smoker    Quit date: 08/16/1978  . Smokeless tobacco: Never Used  . Alcohol Use: Yes     rare  . Drug Use: Not on file  . Sexually Active: Not on file   Other Topics Concern  . Not on file   Social History Narrative   Has living will. No health care POA---son or daughterwould accept resuscitation--but no prolonged artificial ventilationwould not want feeding tube if cognitively unaware   Review of Systems Sleeping better now Appetite up--weight is up some. He will be more careful     Objective:   Physical Exam  Constitutional: He appears well-developed and well-nourished. No distress.  Neck: Normal range of motion. Neck supple. No thyromegaly present.       no carotid bruits  Cardiovascular: Normal rate, regular rhythm and normal heart sounds.  Exam reveals no gallop.   No murmur heard.      Normal pulse on right Faint on left foot  Pulmonary/Chest: Effort normal and breath sounds normal. No respiratory distress. He has no wheezes. He has no rales.  Musculoskeletal: Normal range of motion. He exhibits no edema and no tenderness.  Lymphadenopathy:    He has no cervical adenopathy.  Psychiatric: He has a normal mood and affect. His behavior is normal. Judgment and thought content normal.          Assessment & Plan:

## 2011-11-17 ENCOUNTER — Other Ambulatory Visit: Payer: Self-pay | Admitting: *Deleted

## 2011-11-17 MED ORDER — METFORMIN HCL 500 MG PO TABS: 500.0000 mg | ORAL_TABLET | Freq: Every day | ORAL | Status: DC

## 2011-12-01 ENCOUNTER — Encounter: Payer: Self-pay | Admitting: Internal Medicine

## 2011-12-01 ENCOUNTER — Ambulatory Visit (INDEPENDENT_AMBULATORY_CARE_PROVIDER_SITE_OTHER): Payer: Medicare Other | Admitting: Internal Medicine

## 2011-12-01 VITALS — BP 128/60 | HR 78 | Temp 97.7°F | Wt 188.0 lb

## 2011-12-01 DIAGNOSIS — J209 Acute bronchitis, unspecified: Secondary | ICD-10-CM

## 2011-12-01 MED ORDER — AZITHROMYCIN 250 MG PO TABS: ORAL_TABLET | ORAL | Status: AC

## 2011-12-01 MED ORDER — HYDROCODONE-HOMATROPINE 5-1.5 MG/5ML PO SYRP: 5.0000 mL | ORAL_SOLUTION | Freq: Every evening | ORAL | Status: AC | PRN

## 2011-12-01 NOTE — Progress Notes (Signed)
  Subjective:    Patient ID: KINCAID TIGER, male    DOB: 1933/01/09, 78 y.o.   MRN: [id number]  HPI Micah Flesher to [place] recently---was cold. 3 weeks ago Started about 3 weeks ago Has had terrible cough in chest  Lots of phlegm --mostly swallows OTC meds not helping so he is not sleeping  Head is clear Does have some "normal" drainage No sore throat other than from cough No ear pain  No fever Feels weak and tired No SOB  Has taken some cold meds--no help  Current Outpatient Prescriptions on File Prior to Visit  Medication Sig Dispense Refill  . amLODipine-benazepril (LOTREL) 10-20 MG per capsule Take 1 capsule by mouth daily.  30 capsule  11  . aspirin 81 MG tablet Take 81 mg by mouth daily.        Marland Kitchen docusate sodium (COLACE) 100 MG capsule Take 100 mg by mouth 2 (two) times daily.        . metFORMIN (GLUCOPHAGE) 500 MG tablet Take 1 tablet (500 mg total) by mouth daily with breakfast.  90 tablet  3  . montelukast (SINGULAIR) 10 MG tablet Take 1 tablet (10 mg total) by mouth at bedtime.  30 tablet  11  . pravastatin (PRAVACHOL) 80 MG tablet Take 1 tablet (80 mg total) by mouth daily.  90 tablet  3    No Known Allergies  Past Medical History  Diagnosis Date  . Diabetes mellitus   . Hyperlipidemia   . Hypertension   . Allergy   . BPH (benign prostatic hypertrophy)     Past Surgical History  Procedure Date  . Ventral hernia repair     periumbilical  . Tonsillectomy and adenoidectomy   . Prostate biopsy     negative    Family History  Problem Relation Age of Onset  . Heart disease Mother   . Diabetes Father   . Heart disease Father   . Diabetes Brother   . Heart disease Brother     History   Social History  . Marital Status: Widowed    Spouse Name: N/A    Number of Children: 3  . Years of Education: N/A   Occupational History  . retired Naval architect    Social History Main Topics  . Smoking status: Former Smoker    Quit date: 08/16/1978  . Smokeless  tobacco: Never Used  . Alcohol Use: Yes     rare  . Drug Use: Not on file  . Sexually Active: Not on file   Other Topics Concern  . Not on file   Social History Narrative   Has living will. No health care POA---son or daughterwould accept resuscitation--but no prolonged artificial ventilationwould not want feeding tube if cognitively unaware   Review of Systems No rash No vomiting or diarrhea     Objective:   Physical Exam  Constitutional: He appears well-developed and well-nourished. No distress.  HENT:  Mouth/Throat: Oropharynx is clear and moist. No oropharyngeal exudate.       No sinus tenderness Mild nasal swelling TMs normal  Neck: Normal range of motion. Neck supple.  Pulmonary/Chest: Effort normal and breath sounds normal. No respiratory distress. He has no wheezes. He has no rales.       No dullness  Lymphadenopathy:    He has no cervical adenopathy.          Assessment & Plan:

## 2011-12-01 NOTE — Assessment & Plan Note (Signed)
Seems to have secondary bacterial infection at this point Will give z-pak----has done well with this in past Will change next week if not better Hydrocodone cough syrup

## 2011-12-15 ENCOUNTER — Other Ambulatory Visit: Payer: Self-pay | Admitting: Internal Medicine

## 2012-01-25 ENCOUNTER — Ambulatory Visit: Payer: Medicare Other | Admitting: Internal Medicine

## 2012-02-01 ENCOUNTER — Ambulatory Visit (INDEPENDENT_AMBULATORY_CARE_PROVIDER_SITE_OTHER): Payer: Medicare Other | Admitting: Internal Medicine

## 2012-02-01 ENCOUNTER — Encounter: Payer: Self-pay | Admitting: Internal Medicine

## 2012-02-01 VITALS — BP 138/70 | HR 71 | Temp 98.0°F | Ht 67.0 in | Wt 181.0 lb

## 2012-02-01 DIAGNOSIS — E785 Hyperlipidemia, unspecified: Secondary | ICD-10-CM

## 2012-02-01 DIAGNOSIS — I10 Essential (primary) hypertension: Secondary | ICD-10-CM

## 2012-02-01 DIAGNOSIS — N400 Enlarged prostate without lower urinary tract symptoms: Secondary | ICD-10-CM

## 2012-02-01 DIAGNOSIS — E119 Type 2 diabetes mellitus without complications: Secondary | ICD-10-CM

## 2012-02-01 LAB — CBC WITH DIFFERENTIAL/PLATELET
Basophils Absolute: 0 10*3/uL (ref 0.0–0.1)
HCT: 38.7 % — ABNORMAL LOW (ref 39.0–52.0)
Lymphocytes Relative: 34.9 % (ref 12.0–46.0)
Lymphs Abs: 2.4 10*3/uL (ref 0.7–4.0)
Monocytes Relative: 10 % (ref 3.0–12.0)
Neutrophils Relative %: 53.1 % (ref 43.0–77.0)
Platelets: 179 10*3/uL (ref 150.0–400.0)
RDW: 13.7 % (ref 11.5–14.6)
WBC: 6.8 10*3/uL (ref 4.5–10.5)

## 2012-02-01 LAB — MICROALBUMIN / CREATININE URINE RATIO
Creatinine,U: 106 mg/dL
Microalb, Ur: 1.2 mg/dL (ref 0.0–1.9)

## 2012-02-01 LAB — LIPID PANEL
HDL: 58 mg/dL (ref >39.00)
LDL Cholesterol: 83 mg/dL (ref 0–99)
Total CHOL/HDL Ratio: 3
VLDL: 18 mg/dL (ref 0.0–40.0)

## 2012-02-01 LAB — BASIC METABOLIC PANEL
BUN: 16 mg/dL (ref 6–23)
Calcium: 8.9 mg/dL (ref 8.4–10.5)
Creatinine, Ser: 1.2 mg/dL (ref 0.4–1.5)
GFR: 64.98 mL/min (ref >60.00)
Glucose, Bld: 131 mg/dL — ABNORMAL HIGH (ref 70–99)

## 2012-02-01 LAB — HEPATIC FUNCTION PANEL
Albumin: 4 g/dL (ref 3.5–5.2)
Alkaline Phosphatase: 51 U/L (ref 39–117)
Total Bilirubin: 0.6 mg/dL (ref 0.3–1.2)

## 2012-02-01 LAB — TSH: TSH: 0.49 u[IU]/mL (ref 0.35–5.50)

## 2012-02-01 NOTE — Progress Notes (Signed)
Subjective:    Patient ID: John Sanford, male    DOB: [birth year], 78 y.o.   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 No other myalgias  Has noticed this in past 2-3 years  Doesn't check sugars Eats fairly healthy with occ splurge (occ cookie) No Rx still  Very slight edema No headaches  Current Outpatient Prescriptions on File Prior to Visit  Medication Sig Dispense Refill  . amLODipine-benazepril (LOTREL) 10-20 MG per capsule Take 1 capsule by mouth daily.  30 capsule  11  . aspirin 81 MG tablet Take 81 mg by mouth daily.        . metFORMIN (GLUCOPHAGE) 500 MG tablet TAKE 1 TABLET (500 MG TOTAL) BY MOUTH DAILY WITH BREAKFAST.  30 tablet  2  . montelukast (SINGULAIR) 10 MG tablet Take 1 tablet (10 mg total) by mouth at bedtime.  30 tablet  11  . pravastatin (PRAVACHOL) 80 MG tablet Take 1 tablet (80 mg total) by mouth daily.  90 tablet  3  . DISCONTD: metFORMIN (GLUCOPHAGE) 500 MG tablet Take 1 tablet (500 mg total) by mouth daily with breakfast.  90 tablet  3    No Known Allergies  Past Medical History  Diagnosis Date  . Diabetes mellitus   . Hyperlipidemia   . Hypertension   . Allergy   . BPH (benign prostatic hypertrophy)     Past Surgical History  Procedure Date  . Ventral hernia repair     periumbilical  . Tonsillectomy and adenoidectomy   . Prostate biopsy     negative    Family History  Problem Relation Age of Onset  . Heart disease Mother   . Diabetes Father   . Heart disease Father   . Diabetes Brother   . Heart disease Brother     History   Social History  . Marital Status: Widowed    Spouse Name: N/A    Number of Children: 3  . Years  of Education: N/A   Occupational History  . retired Naval architect    Social History Main Topics  . Smoking status: Former Smoker    Quit date: 08/16/1978  . Smokeless tobacco: Never Used  . Alcohol Use: Yes     rare  . Drug Use: Not on file  . Sexually Active: Not on file   Other Topics Concern  . Not on file   Social History Narrative   Has living will. No health care POA---son or daughterwould accept resuscitation--but no prolonged artificial ventilationwould not want feeding tube if cognitively unaware   Review of Systems Sleeping better now that he is working outside more Weight is down 7# since last time Voids okay. Nocturia is not nightly     Objective:   Physical Exam  Constitutional: He appears well-developed and well-nourished. No distress.  Neck: Normal range of motion. Neck supple. No thyromegaly present.  Cardiovascular: Normal rate, regular rhythm, normal heart sounds and intact distal pulses.  Exam reveals no gallop.   No murmur heard. Pulmonary/Chest: Effort normal and breath sounds normal. No respiratory distress. He has no wheezes. He has no rales.  Abdominal: Soft. There  is no tenderness.  Musculoskeletal: He exhibits no edema and no tenderness.  Lymphadenopathy:    He has no cervical adenopathy.  Skin:       No foot lesions  Psychiatric: He has a normal mood and affect. His behavior is normal.          Assessment & Plan:

## 2012-02-01 NOTE — Assessment & Plan Note (Signed)
Voids okay without meds 

## 2012-02-01 NOTE — Assessment & Plan Note (Signed)
Fairly good Goal is less than 100 but not sure changing is a good idea anyway Will recheck today

## 2012-02-01 NOTE — Assessment & Plan Note (Signed)
BP Readings from Last 3 Encounters:  02/01/12 138/70  12/01/11 128/60  07/27/11 139/64   Good control No changes needed

## 2012-02-01 NOTE — Assessment & Plan Note (Signed)
Lab Results  Component Value Date   HGBA1C 7.0* 07/27/2011   Good control without meds Will check again

## 2012-02-02 ENCOUNTER — Encounter: Payer: Self-pay | Admitting: *Deleted

## 2012-02-21 ENCOUNTER — Other Ambulatory Visit: Payer: Self-pay | Admitting: Internal Medicine

## 2012-03-02 ENCOUNTER — Other Ambulatory Visit: Payer: Self-pay | Admitting: *Deleted

## 2012-03-02 MED ORDER — MONTELUKAST SODIUM 10 MG PO TABS: 10.0000 mg | ORAL_TABLET | Freq: Every day | ORAL | Status: DC

## 2012-04-28 ENCOUNTER — Other Ambulatory Visit: Payer: Self-pay | Admitting: *Deleted

## 2012-04-28 MED ORDER — AMLODIPINE BESY-BENAZEPRIL HCL 10-20 MG PO CAPS: 1.0000 | ORAL_CAPSULE | Freq: Every day | ORAL | Status: DC

## 2012-07-12 ENCOUNTER — Other Ambulatory Visit: Payer: Self-pay | Admitting: *Deleted

## 2012-07-12 MED ORDER — PRAVASTATIN SODIUM 80 MG PO TABS: 80.0000 mg | ORAL_TABLET | Freq: Every day | ORAL | Status: DC

## 2012-07-18 ENCOUNTER — Encounter: Payer: Self-pay | Admitting: Gastroenterology

## 2012-07-25 LAB — HM DIABETES EYE EXAM

## 2012-08-30 ENCOUNTER — Encounter: Payer: Self-pay | Admitting: Internal Medicine

## 2012-08-30 ENCOUNTER — Ambulatory Visit (INDEPENDENT_AMBULATORY_CARE_PROVIDER_SITE_OTHER): Payer: Medicare Other | Admitting: Internal Medicine

## 2012-08-30 VITALS — BP 150/70 | HR 78 | Temp 98.3°F | Ht 68.5 in | Wt 180.0 lb

## 2012-08-30 DIAGNOSIS — E119 Type 2 diabetes mellitus without complications: Secondary | ICD-10-CM

## 2012-08-30 DIAGNOSIS — I10 Essential (primary) hypertension: Secondary | ICD-10-CM

## 2012-08-30 DIAGNOSIS — E785 Hyperlipidemia, unspecified: Secondary | ICD-10-CM

## 2012-08-30 DIAGNOSIS — Z0000 Encounter for general adult medical examination without abnormal findings: Secondary | ICD-10-CM

## 2012-08-30 DIAGNOSIS — Z0001 Encounter for general adult medical examination with abnormal findings: Secondary | ICD-10-CM | POA: Insufficient documentation

## 2012-08-30 DIAGNOSIS — N400 Enlarged prostate without lower urinary tract symptoms: Secondary | ICD-10-CM

## 2012-08-30 DIAGNOSIS — Z23 Encounter for immunization: Secondary | ICD-10-CM

## 2012-08-30 LAB — HEMOGLOBIN A1C: Hgb A1c MFr Bld: 7.5 % — ABNORMAL HIGH (ref 4.6–6.5)

## 2012-08-30 LAB — HM DIABETES FOOT EXAM

## 2012-08-30 MED ORDER — HYDROCODONE-HOMATROPINE 5-1.5 MG/5ML PO SYRP: 5.0000 mL | ORAL_SOLUTION | Freq: Every evening | ORAL | Status: DC | PRN

## 2012-08-30 NOTE — Assessment & Plan Note (Signed)
I have personally reviewed the Medicare Annual Wellness questionnaire and have noted 1. The patient's medical and social history 2. Their use of alcohol, tobacco or illicit drugs 3. Their current medications and supplements 4. The patient's functional ability including ADL's, fall risks, home safety risks and hearing or visual             impairment. 5. Diet and physical activities 6. Evidence for depression or mood disorders  The patients weight, height, BMI and visual acuity have been recorded in the chart I have made referrals, counseling and provided education to the patient based review of the above and I have provided the pt with a written personalized care plan for preventive services.  I have provided you with a copy of your personalized plan for preventive services. Please take the time to review along with your updated medication list.  Doing well Declines the zostavax Flu vaccine today No PSA due to age and past negative biopsy after elevated PSA

## 2012-08-30 NOTE — Assessment & Plan Note (Signed)
LDL 83 No changes needed

## 2012-08-30 NOTE — Addendum Note (Signed)
Addended by: Sueanne Margarita on: 08/30/2012 10:36 AM   Modules accepted: Orders

## 2012-08-30 NOTE — Assessment & Plan Note (Signed)
BP Readings from Last 3 Encounters:  08/30/12 150/70  02/01/12 138/70  12/01/11 128/60   Up some today with the cold No changes

## 2012-08-30 NOTE — Assessment & Plan Note (Signed)
Probably good control still Will check labs

## 2012-08-30 NOTE — Progress Notes (Signed)
Subjective:    Patient ID: John Sanford, male    DOB: 14-Sep-1932, 79 y.o.   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 Will get flu shot today  Has had cold for about 2 weeks Improving now Using some OTC meds  Doesn't check sugars No apparent low sugars No neuropathy symptoms Urine microal negative last visit  No chest pain No SOB Has had cough when supine with the cold---improving now  Voids okay Done with urologist Nocturia x 1 which is stable No daytime urgency  No myalgia or GI problems on his statin  Current Outpatient Prescriptions on File Prior to Visit  Medication Sig Dispense Refill  . amLODipine-benazepril (LOTREL) 10-20 MG per capsule Take 1 capsule by mouth daily.  30 capsule  11  . aspirin 81 MG tablet Take 81 mg by mouth daily.        . metFORMIN (GLUCOPHAGE) 500 MG tablet TAKE 1 TABLET (500 MG TOTAL) BY MOUTH DAILY WITH BREAKFAST.  30 tablet  11  . montelukast (SINGULAIR) 10 MG tablet Take 1 tablet (10 mg total) by mouth at bedtime.  30 tablet  11  . pravastatin (PRAVACHOL) 80 MG tablet Take 1 tablet (80 mg total) by mouth daily.  90 tablet  1    No Known Allergies  Past Medical History  Diagnosis Date  . Diabetes mellitus   . Hyperlipidemia   . Hypertension   . Allergy   . BPH (benign prostatic hypertrophy)     Past Surgical History  Procedure Date  . Ventral hernia repair     periumbilical  . Tonsillectomy and adenoidectomy   . Prostate biopsy     negative    Family History  Problem Relation Age of Onset  . Heart disease Mother   . Diabetes Father   . Heart disease Father   . Diabetes Brother   . Heart disease Brother      History   Social History  . Marital Status: Widowed    Spouse Name: N/A    Number of Children: 3  . Years of Education: N/A   Occupational History  . retired Naval architect    Social History Main Topics  . Smoking status: Former Smoker    Quit date: 08/16/1978  . Smokeless tobacco: Never Used  . Alcohol Use: No     Comment: rare  . Drug Use: Not on file  . Sexually Active: Not on file   Other Topics Concern  . Not on file   Social History Narrative   Has living will. No health care POA---son or daughterWould accept resuscitation--but no prolonged artificial ventilationWould not want feeding tube if cognitively unaware   Review of Systems Sleeps fairly well--only occ bad nights Weight is stable Bowels have been okay      Objective:   Physical Exam  Constitutional: He is oriented to person, place, and time. He appears well-developed and well-nourished. No distress.  Neck: Normal range of motion. Neck supple. No thyromegaly present.  Cardiovascular: Normal rate, regular rhythm, normal heart sounds and intact distal pulses.  Exam reveals no gallop.   No murmur heard. Pulmonary/Chest: Effort normal and breath sounds normal. No respiratory distress. He has no wheezes.  He has no rales.  Abdominal: Soft. There is no tenderness.  Musculoskeletal: He exhibits no edema and no tenderness.  Lymphadenopathy:    He has no cervical adenopathy.  Neurological: He is alert and oriented to person, place, and time.       President -- "Obama, Bush, ?" [phone] (some difficulty) [id number] Recall 2/3  Skin: No rash noted. No erythema.       Slight callous on plantar great toes but no ulcers  Psychiatric: He has a normal mood and affect. His behavior is normal.          Assessment & Plan:

## 2012-08-30 NOTE — Assessment & Plan Note (Signed)
Voiding okay 

## 2012-08-31 ENCOUNTER — Encounter: Payer: Self-pay | Admitting: *Deleted

## 2013-01-16 ENCOUNTER — Other Ambulatory Visit: Payer: Self-pay | Admitting: *Deleted

## 2013-01-16 MED ORDER — PRAVASTATIN SODIUM 80 MG PO TABS: 80.0000 mg | ORAL_TABLET | Freq: Every day | ORAL | Status: DC

## 2013-02-22 ENCOUNTER — Encounter: Payer: Self-pay | Admitting: Internal Medicine

## 2013-02-22 ENCOUNTER — Ambulatory Visit (INDEPENDENT_AMBULATORY_CARE_PROVIDER_SITE_OTHER): Payer: Medicare Other | Admitting: Internal Medicine

## 2013-02-22 ENCOUNTER — Ambulatory Visit (INDEPENDENT_AMBULATORY_CARE_PROVIDER_SITE_OTHER)
Admission: RE | Admit: 2013-02-22 | Discharge: 2013-02-22 | Disposition: A | Discharge status: Home / Self Care | Payer: Medicare Other | Source: Ambulatory Visit | Attending: Internal Medicine | Admitting: Internal Medicine

## 2013-02-22 VITALS — BP 150/70 | HR 100 | Temp 101.4°F | Resp 30 | Wt 181.0 lb

## 2013-02-22 DIAGNOSIS — R509 Fever, unspecified: Secondary | ICD-10-CM

## 2013-02-22 DIAGNOSIS — J189 Pneumonia, unspecified organism: Secondary | ICD-10-CM | POA: Insufficient documentation

## 2013-02-22 MED ORDER — LEVOFLOXACIN 750 MG PO TABS: 750.0000 mg | ORAL_TABLET | Freq: Every day | ORAL | Status: DC

## 2013-02-22 MED ORDER — CEFTRIAXONE SODIUM 1 G IJ SOLR
1.0000 g | Freq: Once | INTRAMUSCULAR | Status: AC
  Administered 2013-02-22: 1 g via INTRAMUSCULAR

## 2013-02-22 NOTE — Patient Instructions (Signed)
Please call 911 for transfer to the emergency room if you have worsening shortness of breath.

## 2013-02-22 NOTE — Progress Notes (Signed)
Subjective:    Patient ID: John Sanford, male    DOB: Nov 04, 1932, 79 y.o.   MRN: [id number]  HPI Went to nephew's funeral 3 days ago Started feeling bad but still able to go Some nausea but no vertigo Fever through this time--- up as high as 103 Some sweats after tylenol--no chills or shakes  Pleuritic right chest pain---with cough or deep breath Used left over narcotic cough syrup (allowed him to sleep) Has to prop up or lots clear drainage Not congested in head No sore throat No ear pain  Some SOB---has to take very shallow breaths Impaired activity now Has used some advil also--this also helped him get rest  Current Outpatient Prescriptions on File Prior to Visit  Medication Sig Dispense Refill  . amLODipine-benazepril (LOTREL) 10-20 MG per capsule Take 1 capsule by mouth daily.  30 capsule  11  . aspirin 81 MG tablet Take 81 mg by mouth daily.        Marland Kitchen docusate sodium (COLACE) 50 MG capsule Take by mouth 2 (two) times daily.      . metFORMIN (GLUCOPHAGE) 500 MG tablet TAKE 1 TABLET (500 MG TOTAL) BY MOUTH DAILY WITH BREAKFAST.  30 tablet  11  . montelukast (SINGULAIR) 10 MG tablet Take 1 tablet (10 mg total) by mouth at bedtime.  30 tablet  11  . niacin 500 MG tablet Take 500 mg by mouth daily with breakfast.      . pravastatin (PRAVACHOL) 80 MG tablet Take 1 tablet (80 mg total) by mouth daily.  90 tablet  1   No current facility-administered medications on file prior to visit.    No Known Allergies  Past Medical History  Diagnosis Date  . Diabetes mellitus   . Hyperlipidemia   . Hypertension   . Allergy   . BPH (benign prostatic hypertrophy)     Past Surgical History  Procedure Laterality Date  . Ventral hernia repair      periumbilical  . Tonsillectomy and adenoidectomy    . Prostate biopsy      negative    Family History  Problem Relation Age of Onset  . Heart disease Mother   . Diabetes Father   . Heart disease Father   . Diabetes Brother    . Heart disease Brother     History   Social History  . Marital Status: Widowed    Spouse Name: N/A    Number of Children: 3  . Years of Education: N/A   Occupational History  . retired Naval architect    Social History Main Topics  . Smoking status: Former Smoker    Quit date: 08/16/1978  . Smokeless tobacco: Never Used  . Alcohol Use: No     Comment: rare  . Drug Use: Not on file  . Sexually Active: Not on file   Other Topics Concern  . Not on file   Social History Narrative   Has living will. No health care POA---son or daughter   Would accept resuscitation--but no prolonged artificial ventilation   Would not want feeding tube if cognitively unaware   Review of Systems No vomiting or diarrhea Appetite is off but able to eat No rash Increased urination over the past 3 weeks---- at night    Objective:   Physical Exam  Constitutional: He appears well-developed and well-nourished.  tachypneic  HENT:  Mouth/Throat: Oropharynx is clear and moist. No oropharyngeal exudate.  No sinus tenderness  Neck: Normal range of motion. Neck  supple.  Cardiovascular: Normal rate and regular rhythm.  Exam reveals no gallop.   Murmur heard. Coarse systolic murmur  Pulmonary/Chest: He is in respiratory distress. He has no wheezes. He has rales.  tachypneic and splints with deep breath (on right side) ?dullness at right base Decreased breath sounds and crackles at right base  Musculoskeletal: He exhibits no edema.  Lymphadenopathy:    He has no cervical adenopathy.  Skin: No rash noted.  No signs of infection  Psychiatric: He has a normal mood and affect. His behavior is normal.          Assessment & Plan:

## 2013-02-22 NOTE — Addendum Note (Signed)
Addended by: Sueanne Margarita on: 02/22/2013 02:46 PM   Modules accepted: Orders

## 2013-02-22 NOTE — Assessment & Plan Note (Signed)
Classic presentation and exam CXR confirms tachypneic and febrile but no really in distress Oximetry is acceptable Will give rocephin Start high dose levaquin See back tomorrow

## 2013-02-23 ENCOUNTER — Encounter: Payer: Self-pay | Admitting: Internal Medicine

## 2013-02-23 ENCOUNTER — Ambulatory Visit (INDEPENDENT_AMBULATORY_CARE_PROVIDER_SITE_OTHER): Payer: Medicare Other | Admitting: Internal Medicine

## 2013-02-23 VITALS — BP 140/50 | HR 102 | Temp 98.2°F | Resp 24 | Wt 180.0 lb

## 2013-02-23 DIAGNOSIS — J189 Pneumonia, unspecified organism: Secondary | ICD-10-CM

## 2013-02-23 MED ORDER — CEFTRIAXONE SODIUM 1 G IJ SOLR
1.0000 g | Freq: Once | INTRAMUSCULAR | Status: AC
  Administered 2013-02-23: 1 g via INTRAMUSCULAR

## 2013-02-23 NOTE — Progress Notes (Signed)
  Subjective:    Patient ID: John Sanford, male    DOB: [birth year], 79 y.o.   MRN: [id number]  HPI Feels better Did take the levofloxacin when he went home--- no problems  Took advil after visit Then started rattling in chest---called  friend RN  Told him to take tylenol and sleep in recliner--still didn't sleep that well  No fever today  Cough is mostly gone Still hacks up some sputum---but much less Breathing is still heavy Pleuritic pain is better  Current Outpatient Prescriptions on File Prior to Visit  Medication Sig Dispense Refill  . amLODipine-benazepril (LOTREL) 10-20 MG per capsule Take 1 capsule by mouth daily.  30 capsule  11  . aspirin 81 MG tablet Take 81 mg by mouth daily.        Marland Kitchen docusate sodium (COLACE) 50 MG capsule Take by mouth 2 (two) times daily.      Marland Kitchen levofloxacin (LEVAQUIN) 750 MG tablet Take 1 tablet (750 mg total) by mouth daily.  7 tablet  1  . metFORMIN (GLUCOPHAGE) 500 MG tablet TAKE 1 TABLET (500 MG TOTAL) BY MOUTH DAILY WITH BREAKFAST.  30 tablet  11  . montelukast (SINGULAIR) 10 MG tablet Take 1 tablet (10 mg total) by mouth at bedtime.  30 tablet  11  . niacin 500 MG tablet Take 500 mg by mouth daily with breakfast.      . pravastatin (PRAVACHOL) 80 MG tablet Take 1 tablet (80 mg total) by mouth daily.  90 tablet  1   No current facility-administered medications on file prior to visit.    No Known Allergies  Past Medical History  Diagnosis Date  . Diabetes mellitus   . Hyperlipidemia   . Hypertension   . Allergy   . BPH (benign prostatic hypertrophy)     Past Surgical History  Procedure Laterality Date  . Ventral hernia repair      periumbilical  . Tonsillectomy and adenoidectomy    . Prostate biopsy      negative    Family History  Problem Relation Age of Onset  . Heart disease Mother   . Diabetes Father   . Heart disease Father   . Diabetes Brother   . Heart disease Brother     History   Social History  .  Marital Status: Widowed    Spouse Name: N/A    Number of Children: 3  . Years of Education: N/A   Occupational History  . retired Naval architect    Social History Main Topics  . Smoking status: Former Smoker    Quit date: 08/16/1978  . Smokeless tobacco: Never Used  . Alcohol Use: No     Comment: rare  . Drug Use: Not on file  . Sexually Active: Not on file   Other Topics Concern  . Not on file   Social History Narrative   Has living will. No health care POA---son or daughter   Would accept resuscitation--but no prolonged artificial ventilation   Would not want feeding tube if cognitively unaware   Review of Systems Appetite is still off No vomiting     Objective:   Physical Exam  Constitutional: He appears well-developed and well-nourished. No distress.  Pulmonary/Chest: No respiratory distress. He has no wheezes. He has rales.  Still with slight decreased breath sounds and fine crackles along right base          Assessment & Plan:

## 2013-02-23 NOTE — Assessment & Plan Note (Signed)
Clinically better RR is lower No fever Looks much better  Will give rocephin again Continue high dose levofloxacin Has follow up next week

## 2013-02-23 NOTE — Addendum Note (Signed)
Addended by: Eliezer Bottom on: 02/23/2013 12:43 PM   Modules accepted: Orders

## 2013-02-27 ENCOUNTER — Other Ambulatory Visit: Payer: Self-pay | Admitting: Internal Medicine

## 2013-02-27 ENCOUNTER — Encounter: Payer: Self-pay | Admitting: Internal Medicine

## 2013-02-27 ENCOUNTER — Ambulatory Visit (INDEPENDENT_AMBULATORY_CARE_PROVIDER_SITE_OTHER): Payer: Medicare Other | Admitting: Internal Medicine

## 2013-02-27 VITALS — BP 130/60 | HR 86 | Temp 97.7°F | Wt 176.0 lb

## 2013-02-27 DIAGNOSIS — J189 Pneumonia, unspecified organism: Secondary | ICD-10-CM

## 2013-02-27 DIAGNOSIS — E785 Hyperlipidemia, unspecified: Secondary | ICD-10-CM

## 2013-02-27 DIAGNOSIS — N400 Enlarged prostate without lower urinary tract symptoms: Secondary | ICD-10-CM

## 2013-02-27 DIAGNOSIS — I10 Essential (primary) hypertension: Secondary | ICD-10-CM

## 2013-02-27 DIAGNOSIS — E1149 Type 2 diabetes mellitus with other diabetic neurological complication: Secondary | ICD-10-CM

## 2013-02-27 LAB — CBC WITH DIFFERENTIAL/PLATELET
Basophils Absolute: 0 10*3/uL (ref 0.0–0.1)
Eosinophils Absolute: 0.3 10*3/uL (ref 0.0–0.7)
Hemoglobin: 11.8 g/dL — ABNORMAL LOW (ref 13.0–17.0)
Lymphocytes Relative: 20.2 % (ref 12.0–46.0)
MCHC: 33.8 g/dL (ref 30.0–36.0)
Monocytes Relative: 12 % (ref 3.0–12.0)
Neutro Abs: 5.8 10*3/uL (ref 1.4–7.7)
Neutrophils Relative %: 63.7 % (ref 43.0–77.0)
RBC: 3.67 Mil/uL — ABNORMAL LOW (ref 4.22–5.81)
RDW: 14.5 % (ref 11.5–14.6)

## 2013-02-27 LAB — HEPATIC FUNCTION PANEL
Alkaline Phosphatase: 111 U/L (ref 39–117)
Bilirubin, Direct: 0 mg/dL (ref 0.0–0.3)
Total Bilirubin: 0.6 mg/dL (ref 0.3–1.2)
Total Protein: 7.2 g/dL (ref 6.0–8.3)

## 2013-02-27 LAB — LIPID PANEL
HDL: 12 mg/dL — ABNORMAL LOW (ref >39.00)
Total CHOL/HDL Ratio: 10
Triglycerides: 136 mg/dL (ref 0.0–149.0)
VLDL: 27.2 mg/dL (ref 0.0–40.0)

## 2013-02-27 LAB — BASIC METABOLIC PANEL
CO2: 26 mEq/L (ref 19–32)
Calcium: 9.3 mg/dL (ref 8.4–10.5)
Creatinine, Ser: 1.2 mg/dL (ref 0.4–1.5)
GFR: 60.56 mL/min (ref >60.00)
Glucose, Bld: 156 mg/dL — ABNORMAL HIGH (ref 70–99)
Sodium: 135 mEq/L (ref 135–145)

## 2013-02-27 NOTE — Assessment & Plan Note (Signed)
No problems on statin Stop the niacin Due for labs

## 2013-02-27 NOTE — Patient Instructions (Addendum)
If your allergies are quiet, you can hold the montelukast when it is not allergy season (like in winter and summer).  Please set up a CXR in about 1 month---to follow up on the pneumonia

## 2013-02-27 NOTE — Assessment & Plan Note (Signed)
Mild symptoms only No meds

## 2013-02-27 NOTE — Assessment & Plan Note (Signed)
Just about resolved clinically Will finish out the levaquin CXR in 1 month

## 2013-02-27 NOTE — Progress Notes (Signed)
Subjective:    Patient ID: John Sanford, male    DOB: Dec 25, 1932, 79 y.o.   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 Notes at night---just uses extra cover  Voids okay Increased frequency with illness Nocturia only x 1  Current Outpatient Prescriptions on File Prior to Visit  Medication Sig Dispense Refill  . amLODipine-benazepril (LOTREL) 10-20 MG per capsule Take 1 capsule by mouth daily.  30 capsule  11  . aspirin 81 MG tablet Take 81 mg by mouth daily.        Marland Kitchen docusate sodium (COLACE) 50 MG capsule Take by mouth 2 (two) times daily.      Marland Kitchen levofloxacin (LEVAQUIN) 750 MG tablet Take 1 tablet (750 mg total) by mouth daily.  7 tablet  1  . metFORMIN (GLUCOPHAGE) 500 MG tablet TAKE 1 TABLET (500 MG TOTAL) BY MOUTH DAILY WITH BREAKFAST.  30 tablet  11  . montelukast (SINGULAIR) 10 MG tablet Take 1 tablet (10 mg total) by mouth at bedtime.  30 tablet  11  . pravastatin (PRAVACHOL) 80 MG tablet Take 1 tablet (80 mg total) by mouth daily.  90 tablet  1   No current facility-administered medications on file prior to visit.    No Known Allergies  Past Medical History  Diagnosis Date  . Diabetes mellitus   . Hyperlipidemia   . Hypertension   . Allergy   . BPH (benign prostatic hypertrophy)     Past Surgical History  Procedure Laterality Date  . Ventral hernia repair      periumbilical  . Tonsillectomy and adenoidectomy    . Prostate biopsy      negative    Family History  Problem Relation Age of  Onset  . Heart disease Mother   . Diabetes Father   . Heart disease Father   . Diabetes Brother   . Heart disease Brother     History   Social History  . Marital Status: Widowed    Spouse Name: N/A    Number of Children: 3  . Years of Education: N/A   Occupational History  . retired Naval architect    Social History Main Topics  . Smoking status: Former Smoker    Quit date: 08/16/1978  . Smokeless tobacco: Never Used  . Alcohol Use: No     Comment: rare  . Drug Use: Not on file  . Sexually Active: Not on file   Other Topics Concern  . Not on file   Social History Narrative   Has living will. No health care POA---son or daughter   Would accept resuscitation--but no prolonged artificial ventilation   Would not want feeding tube if cognitively unaware   Review of Systems Still on montelukast-- wonders about holding it in off season Sleeps okay in general Appetite not the same but weight okay    Objective:   Physical Exam  Constitutional: He appears well-developed and well-nourished. No distress.  Neck: Normal range of motion. Neck supple. No thyromegaly present.  Cardiovascular: Normal rate, regular  rhythm, normal heart sounds and intact distal pulses.  Exam reveals no gallop.   No murmur heard. Pulmonary/Chest: Effort normal. No respiratory distress. He has no wheezes. He has no rales. He exhibits no tenderness.  Air movement normal again on the right. Very slight rhonchi there only  Musculoskeletal: He exhibits no edema and no tenderness.  Lymphadenopathy:    He has no cervical adenopathy.  Skin: Skin is warm. No rash noted.  Psychiatric: He has a normal mood and affect. His behavior is normal.          Assessment & Plan:

## 2013-02-27 NOTE — Assessment & Plan Note (Signed)
Very early neuropathy Will check labs again On ACEI and statin

## 2013-02-27 NOTE — Assessment & Plan Note (Signed)
BP Readings from Last 3 Encounters:  02/27/13 130/60  02/23/13 140/50  02/22/13 150/70   Good control now Due for labs

## 2013-02-28 ENCOUNTER — Encounter: Payer: Self-pay | Admitting: *Deleted

## 2013-03-23 ENCOUNTER — Other Ambulatory Visit: Payer: Self-pay | Admitting: Internal Medicine

## 2013-03-26 ENCOUNTER — Ambulatory Visit (INDEPENDENT_AMBULATORY_CARE_PROVIDER_SITE_OTHER)
Admission: RE | Admit: 2013-03-26 | Discharge: 2013-03-26 | Disposition: A | Discharge status: Home / Self Care | Payer: Medicare Other | Source: Ambulatory Visit | Attending: Internal Medicine | Admitting: Internal Medicine

## 2013-03-26 ENCOUNTER — Telehealth: Payer: Self-pay | Admitting: Radiology

## 2013-03-26 DIAGNOSIS — J189 Pneumonia, unspecified organism: Secondary | ICD-10-CM

## 2013-03-26 MED ORDER — LEVOFLOXACIN 500 MG PO TABS: 500.0000 mg | ORAL_TABLET | Freq: Every day | ORAL | Status: DC

## 2013-03-26 NOTE — Telephone Encounter (Signed)
Patient came in today for repeat chest xray, He said, still feeling weak, sob, low grade fevers. Please call and advise after reading his xray report.

## 2013-03-26 NOTE — Telephone Encounter (Signed)
He is feeling weak and has lost appetite Pleuritic pain and dyspnea are not apparent like before but he doesn't feel well Discussed the CXR report---- RML looks better but new effusion or RLL consolidation Will restart the antiboitics Pulmonary evaluation

## 2013-03-30 ENCOUNTER — Ambulatory Visit (INDEPENDENT_AMBULATORY_CARE_PROVIDER_SITE_OTHER): Payer: Medicare Other | Admitting: Internal Medicine

## 2013-03-30 ENCOUNTER — Encounter: Payer: Self-pay | Admitting: Internal Medicine

## 2013-03-30 VITALS — BP 142/60 | HR 103 | Temp 97.5°F | Ht 68.0 in | Wt 175.8 lb

## 2013-03-30 DIAGNOSIS — I10 Essential (primary) hypertension: Secondary | ICD-10-CM

## 2013-03-30 DIAGNOSIS — J189 Pneumonia, unspecified organism: Secondary | ICD-10-CM

## 2013-03-30 MED ORDER — AMLODIPINE-OLMESARTAN 5-20 MG PO TABS: 1.0000 | ORAL_TABLET | Freq: Every day | ORAL | Status: DC

## 2013-03-30 NOTE — Progress Notes (Signed)
  Subjective:    Patient ID: John Sanford, male    DOB: 02-Jan-1933   MRN: [id number]  HPI  79 yowm quit smoking age 79 referred 03/30/13 to pulmonary clinic by Dr Alphonsus Sias for eval of ? pna   03/30/2013 1st pulmonary eval/ Wert on ACEi  cc abuptly ill July 6th with cough R lower cp white mucus, fever, weak and sob and eval July 10th  rx  Better transiently then worse weak, sob , no high fever and no pain  p rx with levaquin  Main c/o low strength, freq throat clearing with sense of continuous sinus drainage,  No obvious daytime variabilty or assoc excess or purulent sputum or sob    or cp or chest tightness, subjective wheeze overt sinus or hb symptoms. No unusual exp hx or h/o childhood pna/ asthma or knowledge of premature birth.   Sleeping ok without nocturnal  or early am exacerbation  of respiratory  c/o's or need for noct saba. Also denies any obvious fluctuation of symptoms with weather or environmental changes or other aggravating or alleviating factors except as outlined above    Review of Systems  Constitutional: Positive for appetite change. Negative for fever, chills, activity change and unexpected weight change.  HENT: Negative for congestion, sore throat, rhinorrhea, sneezing, trouble swallowing, dental problem, voice change and postnasal drip.   Eyes: Negative for visual disturbance.  Respiratory: Positive for cough and shortness of breath. Negative for choking.   Cardiovascular: Negative for chest pain and leg swelling.  Gastrointestinal: Negative for nausea, vomiting and abdominal pain.  Genitourinary: Negative for difficulty urinating.  Musculoskeletal: Negative for arthralgias.  Skin: Negative for rash.  Psychiatric/Behavioral: Negative for behavioral problems and confusion.       Objective:   Physical Exam  Edentulous wm very hoarse, throat clearing freq during interview though wife notices more than pt   . Wt Readings from Last 3 Encounters:  03/30/13 175  lb 12.8 oz (79.742 kg)  02/27/13 176 lb (79.833 kg)  02/23/13 180 lb (81.647 kg)   HEENT mild turbinate edema.  Oropharynx no thrush or excess pnd or cobblestoning.  No JVD or cervical adenopathy. Mild accessory muscle hypertrophy. Trachea midline, nl thryroid. Chest was hyperinflated by percussion with diminished breath sounds and moderate increased exp time without wheeze. Hoover sign positive at mid inspiration. Regular rate and rhythm without murmur gallop or rub or increase P2 or edema.  Abd: no hsm, nl excursion. Ext warm without cyanosis or clubbing.      cxr  8/11/4  Right middle lobe consolidation has improved now with  linear/discoid type opacity, most likely atelectasis, persisting  along the oblique fissure in the right middle lobe.  New right posterior lower hemithorax opacity consistent with  consolidation of a portion of the right lower lobe or possibly a  loculated right pleural effusion.       Assessment & Plan:

## 2013-03-30 NOTE — Patient Instructions (Addendum)
Stop lotrel(benazapril) Start azor 5/20 one daily  Finish all antibiotics  Please schedule a follow up office visit in 2  weeks, sooner if needed with cxr on return.

## 2013-03-31 NOTE — Assessment & Plan Note (Signed)
ACE inhibitors are problematic in  pts with airway complaints because  even experienced pulmonologists can't always distinguish ace effects from copd/asthma.  By themselves they don't actually cause a problem, much like oxygen can't by itself start a fire, but they certainly serve as a powerful catalyst or enhancer for any "fire"  or inflammatory process in the upper airway, be it caused by an ET  tube or more commonly reflux (especially in the obese or pts with known GERD or who are on biphoshonates).    In the era of ARB near equivalency, at least in short term,  until we have a better handle on the reversibility of the airway problem, it just makes sense to avoid ACEI  entirely in the short run and then decide later, having established a level of airway control using a reasonable limited regimen, whether to add back ace but even then being very careful to observe the pt for worsening airway control and number of meds used/ needed to control symptoms.    For now rx with benicar samples then regroup in 2 weeks to reevaluate excess throat clearing and "sinus drainage"

## 2013-03-31 NOTE — Assessment & Plan Note (Signed)
Agree with rx for CAP - the problem is the persistent throat clearing and "sinus drainage" actually preceded the cap for months if not years suggesting underlying sinus dz or (more likely based on day > noct symptoms) this is just another ace case (see hbp)  Would like to see him back in 2 weeks for f/u cxr but should finish abx and no further rx for now

## 2013-04-09 ENCOUNTER — Encounter: Payer: Self-pay | Admitting: Gastroenterology

## 2013-04-13 ENCOUNTER — Ambulatory Visit (INDEPENDENT_AMBULATORY_CARE_PROVIDER_SITE_OTHER): Payer: Medicare Other | Admitting: Internal Medicine

## 2013-04-13 ENCOUNTER — Ambulatory Visit (INDEPENDENT_AMBULATORY_CARE_PROVIDER_SITE_OTHER)
Admission: RE | Admit: 2013-04-13 | Discharge: 2013-04-13 | Disposition: A | Discharge status: Home / Self Care | Payer: Medicare Other | Source: Ambulatory Visit | Attending: Internal Medicine | Admitting: Internal Medicine

## 2013-04-13 ENCOUNTER — Encounter: Payer: Self-pay | Admitting: Internal Medicine

## 2013-04-13 VITALS — BP 140/70 | HR 80 | Temp 98.1°F | Ht 68.0 in | Wt 168.0 lb

## 2013-04-13 DIAGNOSIS — I10 Essential (primary) hypertension: Secondary | ICD-10-CM

## 2013-04-13 DIAGNOSIS — J189 Pneumonia, unspecified organism: Secondary | ICD-10-CM

## 2013-04-13 MED ORDER — AMLODIPINE-OLMESARTAN 5-40 MG PO TABS: 1.0000 | ORAL_TABLET | Freq: Every day | ORAL | Status: DC

## 2013-04-13 NOTE — Assessment & Plan Note (Addendum)
Almost completely resolved - unfortnately he is a remote smoker and his cxr is not yet "nl"  -  the rml anatomy results typically in a very long cxr lag p pt has improved > final f/u cxr rec at 4 weeks

## 2013-04-13 NOTE — Patient Instructions (Addendum)
Azor 5/40 one daily ok to cut in half if lightheaded standing  Please schedule a follow up office visit in 6 weeks, call sooner if needed

## 2013-04-13 NOTE — Progress Notes (Signed)
  Subjective:    Patient ID: John Sanford, male    DOB: [birth year]   MRN: [id number]  HPI  79 yowm quit smoking age 79 referred 77/15/14 to pulmonary clinic by Dr Alphonsus Sias for eval of ? pna   03/30/2013 1st pulmonary eval/ John Sanford on ACEi  cc abuptly ill July 6th with cough R lower cp white mucus, fever, weak and sob and eval July 10th  rx  Better transiently then worse weak, sob , no high fever and no pain  p rx with levaquin Main c/o low strength, freq throat clearing with sense of continuous sinus drainage. rec Stop lotrel(benazapril) Start azor 5/20 one daily  Finish all antibiotics   04/13/2013 f/u ov/John Sanford re rml pna/ persistent cough  Chief Complaint  Patient presents with  . Follow-up    Cough much improved and his breathing is much better. No new co's today.    no limiting sob, no pleuritic cp, discolored sputum. Not needing any cough suppression  No obvious daytime variabilty or assoc chest tightness, subjective wheeze overt sinus or hb symptoms. No unusual exp hx or h/o childhood pna/ asthma or knowledge of premature birth.   Sleeping ok without nocturnal  or early am exacerbation  of respiratory  c/o's or need for noct saba. Also denies any obvious fluctuation of symptoms with weather or environmental changes or other aggravating or alleviating factors except as outlined above   Current Medications, Allergies, Past Medical History, Past Surgical History, Family History, and Social History were reviewed in [employer] record.  ROS  The following are not active complaints unless bolded sore throat, dysphagia, dental problems, itching, sneezing,  nasal congestion or excess/ purulent secretions, ear ache,   fever, chills, sweats, unintended wt loss, pleuritic or exertional cp, hemoptysis,  orthopnea pnd or leg swelling, presyncope, palpitations, heartburn, abdominal pain, anorexia, nausea, vomiting, diarrhea  or change in bowel or urinary habits, change in  stools or urine, dysuria,hematuria,  rash, arthralgias, visual complaints, headache, numbness weakness or ataxia or problems with walking or coordination,  change in mood/affect or memory.               Objective:   Physical Exam  Edentulous wm min hoarse now, no pseudowheeze   04/13/2013      168  Wt Readings from Last 3 Encounters:  03/30/13 175 lb 12.8 oz (79.742 kg)  02/27/13 176 lb (79.833 kg)  02/23/13 180 lb (81.647 kg)   HEENT mild turbinate edema.  Oropharynx no thrush or excess pnd or cobblestoning.  No JVD or cervical adenopathy. Mild accessory muscle hypertrophy. Trachea midline, nl thryroid. Chest was hyperinflated by percussion with diminished breath sounds and moderate increased exp time without wheeze. Hoover sign positive at mid inspiration. Regular rate and rhythm without murmur gallop or rub or increase P2 or edema.  Abd: no hsm, nl excursion. Ext warm without cyanosis or clubbing.      CXR  04/13/2013 :   Linear scarring/atelectasis in the right middle lobe, improved.       Assessment & Plan:

## 2013-04-13 NOTE — Assessment & Plan Note (Signed)
Start arb in place of ace 03/31/2013 due to excess throat clearing > resolved  This is the type of pt when not having resp symptoms who could be rechallenged with acei if benefits > risk but clearly for now he has improved off them and bp at goal > no change in rx

## 2013-05-28 ENCOUNTER — Encounter: Payer: Self-pay | Admitting: Internal Medicine

## 2013-05-28 ENCOUNTER — Ambulatory Visit (INDEPENDENT_AMBULATORY_CARE_PROVIDER_SITE_OTHER): Payer: Medicare Other | Admitting: Internal Medicine

## 2013-05-28 ENCOUNTER — Ambulatory Visit (INDEPENDENT_AMBULATORY_CARE_PROVIDER_SITE_OTHER)
Admission: RE | Admit: 2013-05-28 | Discharge: 2013-05-28 | Disposition: A | Discharge status: Home / Self Care | Payer: Medicare Other | Source: Ambulatory Visit | Attending: Internal Medicine | Admitting: Internal Medicine

## 2013-05-28 VITALS — BP 140/62 | HR 82 | Temp 97.8°F | Ht 68.0 in | Wt 178.0 lb

## 2013-05-28 DIAGNOSIS — Z23 Encounter for immunization: Secondary | ICD-10-CM

## 2013-05-28 DIAGNOSIS — I10 Essential (primary) hypertension: Secondary | ICD-10-CM

## 2013-05-28 DIAGNOSIS — J189 Pneumonia, unspecified organism: Secondary | ICD-10-CM

## 2013-05-28 NOTE — Patient Instructions (Addendum)
Please remember to go to the x-ray department downstairs for your tests - we will call you with the results when they are available.  See Dr Alphonsus Sias before your samples run out so he can take back over on the treatment of your blood pressure     If you are satisfied with your treatment plan let your doctor know and he/she can either refill your medications or you can return here when your prescription runs out.     If in any way you are not 100% satisfied,  please tell us.  If 100% better, tell your friends!

## 2013-05-28 NOTE — Assessment & Plan Note (Signed)
Adequate control on present rx, reviewed > no change in rx needed  > would avoid acei given his tendency to throat clearing  See instructions for specific recommendations which were reviewed directly with the patient who was given a copy with highlighter outlining the key components.

## 2013-05-28 NOTE — Progress Notes (Signed)
  Subjective:    Patient ID: John Sanford, male    DOB: [birth year]   MRN: [id number]  HPI  79 yowm quit smoking age 79 referred 77/15/14 to pulmonary clinic by Dr Alphonsus Sias for eval of ? pna   03/30/76 1st pulmonary eval/ John Sanford on ACEi  cc abuptly ill July 6th with cough R lower cp white mucus, fever, weak and sob and eval July 10th  rx  Better transiently then worse weak, sob , no high fever and no pain  p rx with levaquin Main c/o low strength, freq throat clearing with sense of continuous sinus drainage. rec Stop lotrel(benazapril) Start azor 5/20 one daily  Finish all antibiotics   04/13/2013 f/u ov/John Sanford re rml pna/ persistent cough  Chief Complaint  Patient presents with  . Follow-up    Cough much improved and his breathing is much better. No new co's today.    no limiting sob, no pleuritic cp, discolored sputum. Not needing any cough suppression rec Continue off acei, final f/u cxr in 6 weeks   05/28/2013 f/u ov/John Sanford re: f/u rml pna, cough resolved Chief Complaint  Patient presents with  . Follow-up    Pt states that he is doing well, no cough or dyspnea. No new co's today.       No obvious daytime variabilty or assoc chest tightness, subjective wheeze overt sinus or hb symptoms. No unusual exp hx or h/o childhood pna/ asthma or knowledge of premature birth.   Sleeping ok without nocturnal  or early am exacerbation  of respiratory  c/o's or need for noct saba. Also denies any obvious fluctuation of symptoms with weather or environmental changes or other aggravating or alleviating factors except as outlined above   Current Medications, Allergies, Past Medical History, Past Surgical History, Family History, and Social History were reviewed in [employer] record.  ROS  The following are not active complaints unless bolded sore throat, dysphagia, dental problems, itching, sneezing,  nasal congestion or excess/ purulent secretions, ear ache,   fever,  chills, sweats, unintended wt loss, pleuritic or exertional cp, hemoptysis,  orthopnea pnd or leg swelling, presyncope, palpitations, heartburn, abdominal pain, anorexia, nausea, vomiting, diarrhea  or change in bowel or urinary habits, change in stools or urine, dysuria,hematuria,  rash, arthralgias, visual complaints, headache, numbness weakness or ataxia or problems with walking or coordination,  change in mood/affect or memory.               Objective:   Physical Exam  Edentulous wm min hoarse now, no pseudowheeze   04/13/2013      79  > 05/28/2013 79  Wt Readings from Last 3 Encounters:  03/30/13 175 lb 12.8 oz (79.742 kg)  02/27/13 176 lb (79.833 kg)  02/23/13 180 lb (81.647 kg)   HEENT mild turbinate edema.  Oropharynx no thrush or excess pnd or cobblestoning.  No JVD or cervical adenopathy. Mild accessory muscle hypertrophy. Trachea midline, nl thryroid. Chest was hyperinflated by percussion with diminished breath sounds and moderate increased exp time without wheeze. Hoover sign positive at mid inspiration. Regular rate and rhythm without murmur gallop or rub or increase P2 or edema.  Abd: no hsm, nl excursion. Ext warm without cyanosis or clubbing.       CXR  05/28/2013 :   Further improvement in right middle lobe density with mild residual linear atelectasis or scarring. No evidence of pneumonia.       Assessment & Plan:

## 2013-05-28 NOTE — Assessment & Plan Note (Signed)
cxr 04/13/2013 almost completely resolved - 05/28/2013 min residual streaky changes > no further xrays rec at this point

## 2013-05-28 NOTE — Progress Notes (Signed)
Quick Note:  Spoke with pt and notified of results per Dr. Wert. Pt verbalized understanding and denied any questions.  ______ 

## 2013-06-28 ENCOUNTER — Other Ambulatory Visit: Payer: Self-pay | Admitting: *Deleted

## 2013-06-28 MED ORDER — AMLODIPINE-OLMESARTAN 5-40 MG PO TABS: 0.5000 | ORAL_TABLET | Freq: Every day | ORAL | Status: DC

## 2013-06-28 NOTE — Telephone Encounter (Signed)
Should be for one half daily but only give one month then needs to see primary or Tammy NP for f/u and longterm rx

## 2013-06-28 NOTE — Telephone Encounter (Signed)
rx was started by Dr. Sherene Sires on 03/2013 was told to follow-up in 6 weeks, which he didn't,  the faxed request has AZOR 10-40 no instructions and what's in his chart is 5-40 1/2 tab daily, please advise

## 2013-07-03 ENCOUNTER — Other Ambulatory Visit: Payer: Self-pay | Admitting: Internal Medicine

## 2013-09-10 ENCOUNTER — Ambulatory Visit (INDEPENDENT_AMBULATORY_CARE_PROVIDER_SITE_OTHER): Payer: Medicare HMO | Admitting: Internal Medicine

## 2013-09-10 ENCOUNTER — Encounter: Payer: Self-pay | Admitting: Internal Medicine

## 2013-09-10 VITALS — BP 122/68 | HR 86 | Temp 97.7°F | Ht 68.0 in | Wt 181.0 lb

## 2013-09-10 DIAGNOSIS — E1149 Type 2 diabetes mellitus with other diabetic neurological complication: Secondary | ICD-10-CM

## 2013-09-10 DIAGNOSIS — Z0000 Encounter for general adult medical examination without abnormal findings: Secondary | ICD-10-CM

## 2013-09-10 DIAGNOSIS — I10 Essential (primary) hypertension: Secondary | ICD-10-CM

## 2013-09-10 DIAGNOSIS — E785 Hyperlipidemia, unspecified: Secondary | ICD-10-CM

## 2013-09-10 LAB — COMPREHENSIVE METABOLIC PANEL
ALBUMIN: 3.9 g/dL (ref 3.5–5.2)
ALK PHOS: 49 U/L (ref 39–117)
ALT: 15 U/L (ref 0–53)
AST: 16 U/L (ref 0–37)
BILIRUBIN TOTAL: 0.8 mg/dL (ref 0.3–1.2)
BUN: 19 mg/dL (ref 6–23)
CO2: 24 mEq/L (ref 19–32)
Calcium: 9 mg/dL (ref 8.4–10.5)
Chloride: 107 mEq/L (ref 96–112)
Creatinine, Ser: 1.2 mg/dL (ref 0.4–1.5)
GFR: 61.63 mL/min (ref >60.00)
GLUCOSE: 125 mg/dL — AB (ref 70–99)
POTASSIUM: 4.2 meq/L (ref 3.5–5.1)
SODIUM: 138 meq/L (ref 135–145)
TOTAL PROTEIN: 6.9 g/dL (ref 6.0–8.3)

## 2013-09-10 LAB — LIPID PANEL
CHOLESTEROL: 184 mg/dL (ref 0–200)
HDL: 48.3 mg/dL (ref >39.00)
LDL Cholesterol: 108 mg/dL — ABNORMAL HIGH (ref 0–99)
Total CHOL/HDL Ratio: 4
Triglycerides: 139 mg/dL (ref 0.0–149.0)
VLDL: 27.8 mg/dL (ref 0.0–40.0)

## 2013-09-10 LAB — CBC WITH DIFFERENTIAL/PLATELET
Basophils Absolute: 0 10*3/uL (ref 0.0–0.1)
Basophils Relative: 0.5 % (ref 0.0–3.0)
EOS ABS: 0.4 10*3/uL (ref 0.0–0.7)
Eosinophils Relative: 4.9 % (ref 0.0–5.0)
HEMATOCRIT: 40 % (ref 39.0–52.0)
Hemoglobin: 13.5 g/dL (ref 13.0–17.0)
LYMPHS ABS: 2.7 10*3/uL (ref 0.7–4.0)
Lymphocytes Relative: 37.3 % (ref 12.0–46.0)
MCHC: 33.7 g/dL (ref 30.0–36.0)
MCV: 91.3 fl (ref 78.0–100.0)
MONO ABS: 0.6 10*3/uL (ref 0.1–1.0)
Monocytes Relative: 7.5 % (ref 3.0–12.0)
NEUTROS PCT: 49.8 % (ref 43.0–77.0)
Neutro Abs: 3.7 10*3/uL (ref 1.4–7.7)
PLATELETS: 161 10*3/uL (ref 150.0–400.0)
RBC: 4.38 Mil/uL (ref 4.22–5.81)
RDW: 14.7 % — AB (ref 11.5–14.6)
WBC: 7.3 10*3/uL (ref 4.5–10.5)

## 2013-09-10 LAB — HEMOGLOBIN A1C: HEMOGLOBIN A1C: 7.5 % — AB (ref 4.6–6.5)

## 2013-09-10 LAB — HM DIABETES FOOT EXAM

## 2013-09-10 MED ORDER — LOSARTAN POTASSIUM 50 MG PO TABS: 50.0000 mg | ORAL_TABLET | Freq: Every day | ORAL | Status: DC

## 2013-09-10 MED ORDER — AMLODIPINE BESYLATE 5 MG PO TABS: 5.0000 mg | ORAL_TABLET | Freq: Every day | ORAL | Status: DC

## 2013-09-10 MED ORDER — ZOSTER VACCINE LIVE 19400 UNT/0.65ML ~~LOC~~ SOLR: 0.6500 mL | Freq: Once | SUBCUTANEOUS | Status: DC

## 2013-09-10 NOTE — Assessment & Plan Note (Signed)
Healthy Rx for zostavax UTD otherwise

## 2013-09-10 NOTE — Assessment & Plan Note (Signed)
Throat issues and cough gone on ARB Will change to losartan due the the cost

## 2013-09-10 NOTE — Assessment & Plan Note (Signed)
No problems on the statin 

## 2013-09-10 NOTE — Patient Instructions (Addendum)
Please set up your annual eye exam (this is important with your diabetes). Please stop the azor--- and take the 2 meds, amlodipine and losartan.  Diabetes Meal Planning Guide The diabetes meal planning guide is a tool to help you plan your meals and snacks. It is important for people with diabetes to manage their blood glucose (sugar) levels. Choosing the right foods and the right amounts throughout your day will help control your blood glucose. Eating right can even help you improve your blood pressure and reach or maintain a healthy weight. CARBOHYDRATE COUNTING MADE EASY When you eat carbohydrates, they turn to sugar. This raises your blood glucose level. Counting carbohydrates can help you control this level so you feel better. When you plan your meals by counting carbohydrates, you can have more flexibility in what you eat and balance your medicine with your food intake. Carbohydrate counting simply means adding up the total amount of carbohydrate grams in your meals and snacks. Try to eat about the same amount at each meal. Foods with carbohydrates are listed below. Each portion below is 1 carbohydrate serving or 15 grams of carbohydrates. Ask your dietician how many grams of carbohydrates you should eat at each meal or snack. Grains and Starches  1 slice bread.   English muffin or hotdog/hamburger bun.   cup cold cereal (unsweetened).   cup cooked pasta or rice.   cup starchy vegetables (corn, potatoes, peas, beans, winter squash).  1 tortilla (6 inches).   bagel.  1 waffle or pancake (size of a CD).   cup cooked cereal.  4 to 6 small crackers. *Whole grain is recommended. Fruit  1 cup fresh unsweetened berries, melon, papaya, pineapple.  1 small fresh fruit.   banana or mango.   cup fruit juice (4 oz unsweetened).   cup canned fruit in natural juice or water.  2 tbs dried fruit.  12 to 15 grapes or cherries. Milk and Yogurt  1 cup fat-free or 1%  milk.  1 cup soy milk.  6 oz light yogurt with sugar-free sweetener.  6 oz low-fat soy yogurt.  6 oz plain yogurt. Vegetables  1 cup raw or  cup cooked is counted as 0 carbohydrates or a "free" food.  If you eat 3 or more servings at 1 meal, count them as 1 carbohydrate serving. Other Carbohydrates   oz chips or pretzels.   cup ice cream or frozen yogurt.   cup sherbet or sorbet.  2 inch square cake, no frosting.  1 tbs honey, sugar, jam, jelly, or syrup.  2 small cookies.  3 squares of graham crackers.  3 cups popcorn.  6 crackers.  1 cup broth-based soup.  Count 1 cup casserole or other mixed foods as 2 carbohydrate servings.  Foods with less than 20 calories in a serving may be counted as 0 carbohydrates or a "free" food. You may want to purchase a book or computer software that lists the carbohydrate gram counts of different foods. In addition, the nutrition facts panel on the labels of the foods you eat are a good source of this information. The label will tell you how big the serving size is and the total number of carbohydrate grams you will be eating per serving. Divide this number by 15 to obtain the number of carbohydrate servings in a portion. Remember, 1 carbohydrate serving equals 15 grams of carbohydrate. SERVING SIZES Measuring foods and serving sizes helps you make sure you are getting the right amount of food.  The list below tells how big or small some common serving sizes are.  1 oz.........4 stacked dice.  3 oz........Marland KitchenDeck of cards.  1 tsp.......Marland KitchenTip of little finger.  1 tbs......Marland KitchenMarland KitchenThumb.  2 tbs.......Marland KitchenGolf ball.   cup......Marland KitchenHalf of a fist.  1 cup.......Marland KitchenA fist. SAMPLE DIABETES MEAL PLAN Below is a sample meal plan that includes foods from the grain and starches, dairy, vegetable, fruit, and meat groups. A dietician can individualize a meal plan to fit your calorie needs and tell you the number of servings needed from each food group.  However, controlling the total amount of carbohydrates in your meal or snack is more important than making sure you include all of the food groups at every meal. You may interchange carbohydrate containing foods (dairy, starches, and fruits). The meal plan below is an example of a 2000 calorie diet using carbohydrate counting. This meal plan has 17 carbohydrate servings. Breakfast  1 cup oatmeal (2 carb servings).   cup light yogurt (1 carb serving).  1 cup blueberries (1 carb serving).   cup almonds. Snack  1 large apple (2 carb servings).  1 low-fat string cheese stick. Lunch  Chicken breast salad.  1 cup spinach.   cup chopped tomatoes.  2 oz chicken breast, sliced.  2 tbs low-fat [place] dressing.  12 whole-wheat crackers (2 carb servings).  12 to 15 grapes (1 carb serving).  1 cup low-fat milk (1 carb serving). Snack  1 cup carrots.   cup hummus (1 carb serving). Dinner  3 oz broiled salmon.  1 cup brown rice (3 carb servings). Snack  1  cups steamed broccoli (1 carb serving) drizzled with 1 tsp olive oil and lemon juice.  1 cup light pudding (2 carb servings). DIABETES MEAL PLANNING WORKSHEET Your dietician can use this worksheet to help you decide how many servings of foods and what types of foods are right for you.  BREAKFAST Food Group and Servings / Carb Servings Grain/Starches __________________________________ Dairy __________________________________________ Vegetable ______________________________________ Fruit ___________________________________________ Meat __________________________________________ Fat ____________________________________________ LUNCH Food Group and Servings / Carb Servings Grain/Starches ___________________________________ Dairy ___________________________________________ Fruit ____________________________________________ Meat ___________________________________________ Fat  _____________________________________________ Wonda Cheng Food Group and Servings / Carb Servings Grain/Starches ___________________________________ Dairy ___________________________________________ Fruit ____________________________________________ Meat ___________________________________________ Fat _____________________________________________ SNACKS Food Group and Servings / Carb Servings Grain/Starches ___________________________________ Dairy ___________________________________________ Vegetable _______________________________________ Fruit ____________________________________________ Meat ___________________________________________ Fat _____________________________________________ DAILY TOTALS Starches _________________________ Vegetable ________________________ Fruit ____________________________ Dairy ____________________________ Meat ____________________________ Fat ______________________________ Document Released: 04/29/2005 Document Revised: 10/25/2011 Document Reviewed: 03/10/2009 [employer] Patient Information 2014 [employer]

## 2013-09-10 NOTE — Assessment & Plan Note (Signed)
Hopefully still good control Minor neuro symptoms

## 2013-09-10 NOTE — Progress Notes (Signed)
Subjective:    Patient ID: John Sanford, male    DOB: 1933/05/25, 80 y.o.   MRN: [id number]  HPI Here for physical Reviewed advanced directives Enjoys life---just went to [place] with son and had a great time  Feels over the pneumonia No longer coughing-- the olmesartan has been good for this He doesn't like the expense--will change him to generic losartan  Doesn't check sugars No low sugar reactions No sores or pain in feet Overdue for eye exam  Current Outpatient Prescriptions on File Prior to Visit  Medication Sig Dispense Refill  . aspirin 81 MG tablet Take 81 mg by mouth daily.        . metFORMIN (GLUCOPHAGE) 500 MG tablet TAKE 1 TABLET (500 MG TOTAL) BY MOUTH DAILY WITH BREAKFAST.  30 tablet  11  . pravastatin (PRAVACHOL) 80 MG tablet TAKE 1 TABLET BY MOUTH DAILY  90 tablet  3   No current facility-administered medications on file prior to visit.    No Known Allergies  Past Medical History  Diagnosis Date  . Diabetes mellitus   . Hyperlipidemia   . Hypertension   . Allergy   . BPH (benign prostatic hypertrophy)     Past Surgical History  Procedure Laterality Date  . Ventral hernia repair      periumbilical  . Tonsillectomy and adenoidectomy    . Prostate biopsy      negative    Family History  Problem Relation Age of Onset  . Heart disease Mother   . Diabetes Father   . Heart disease Father   . Diabetes Brother   . Heart disease Brother     History   Social History  . Marital Status: Widowed    Spouse Name: N/A    Number of Children: 3  . Years of Education: N/A   Occupational History  . retired Administrator    Social History Main Topics  . Smoking status: Former Smoker -- 2.00 packs/day for 20 years    Types: Cigarettes    Quit date: 08/16/1972  . Smokeless tobacco: Never Used  . Alcohol Use: No     Comment: rare  . Drug Use: No  . Sexual Activity: Not on file   Other Topics Concern  . Not on file   Social History  Narrative   Has living will.    No health care POA---son or daughter   Would accept resuscitation--but no prolonged artificial ventilation   Would not want feeding tube if cognitively unaware   Review of Systems  Constitutional: Negative for fatigue and unexpected weight change.       Wears seat belt  HENT: Positive for hearing loss. Negative for dental problem and tinnitus.        Full dentures  Eyes: Negative for visual disturbance.       No diplopia or unilateral vision loss Cataracts are worsening  Respiratory: Negative for cough, chest tightness and shortness of breath.   Cardiovascular: Positive for leg swelling. Negative for chest pain and palpitations.       Slight edema some evenings  Gastrointestinal: Negative for nausea, vomiting, abdominal pain, constipation and blood in stool.       Stools loose since on the azor No heartburn  Endocrine: Positive for cold intolerance. Negative for heat intolerance.  Genitourinary: Positive for difficulty urinating.       Some dribbling No nocturia lately  Musculoskeletal: Positive for arthralgias. Negative for back pain and myalgias.  Mild right hand and more shoulder pain  Skin: Negative for rash.       Sees dermatologist yearly---gets actinics sprayed  Allergic/Immunologic: Negative for environmental allergies and immunocompromised state.  Neurological: Negative for dizziness, syncope, weakness, light-headedness, numbness and headaches.  Hematological: Negative for adenopathy. Does not bruise/bleed easily.  Psychiatric/Behavioral: Negative for sleep disturbance and dysphoric mood. The patient is not nervous/anxious.        Uses a PM to sleep--no AM grogginess       Objective:   Physical Exam  Constitutional: He is oriented to person, place, and time. He appears well-developed and well-nourished. No distress.  HENT:  Head: Normocephalic and atraumatic.  Right Ear: External ear normal.  Left Ear: External ear normal.    Mouth/Throat: Oropharynx is clear and moist. No oropharyngeal exudate.  Eyes: Conjunctivae and EOM are normal. Pupils are equal, round, and reactive to light.  Neck: Normal range of motion. Neck supple. No thyromegaly present.  Cardiovascular: Normal rate, regular rhythm, normal heart sounds and intact distal pulses.  Exam reveals no gallop.   No murmur heard. Pulmonary/Chest: Effort normal and breath sounds normal. No respiratory distress. He has no wheezes. He has no rales.  Abdominal: Soft. There is no tenderness.  Musculoskeletal: He exhibits no edema and no tenderness.  Lymphadenopathy:    He has no cervical adenopathy.  Neurological: He is alert and oriented to person, place, and time.  Normal fine touch sensation on plantar feet  Skin: No rash noted.  No foot lesions  Psychiatric: He has a normal mood and affect. His behavior is normal.          Assessment & Plan:

## 2013-09-10 NOTE — Progress Notes (Signed)
Pre-visit discussion using our clinic review tool. No additional management support is needed unless otherwise documented below in the visit note.  

## 2013-09-11 ENCOUNTER — Encounter: Payer: Self-pay | Admitting: *Deleted

## 2013-09-12 ENCOUNTER — Telehealth: Payer: Self-pay

## 2013-09-12 NOTE — Telephone Encounter (Signed)
Relevant patient education mailed to patient.  

## 2013-09-19 ENCOUNTER — Telehealth: Payer: Self-pay | Admitting: Internal Medicine

## 2013-09-19 NOTE — Telephone Encounter (Signed)
Relevant patient education mailed to patient.  

## 2013-09-26 IMAGING — CR DG CHEST 2V
2 series · 2 of 2 positions shown · non-contrast
Comparison: 03/26/2013

CLINICAL DATA: Follow up pneumonia

CHEST - 2 VIEW

[view not recorded (1 of 2)]
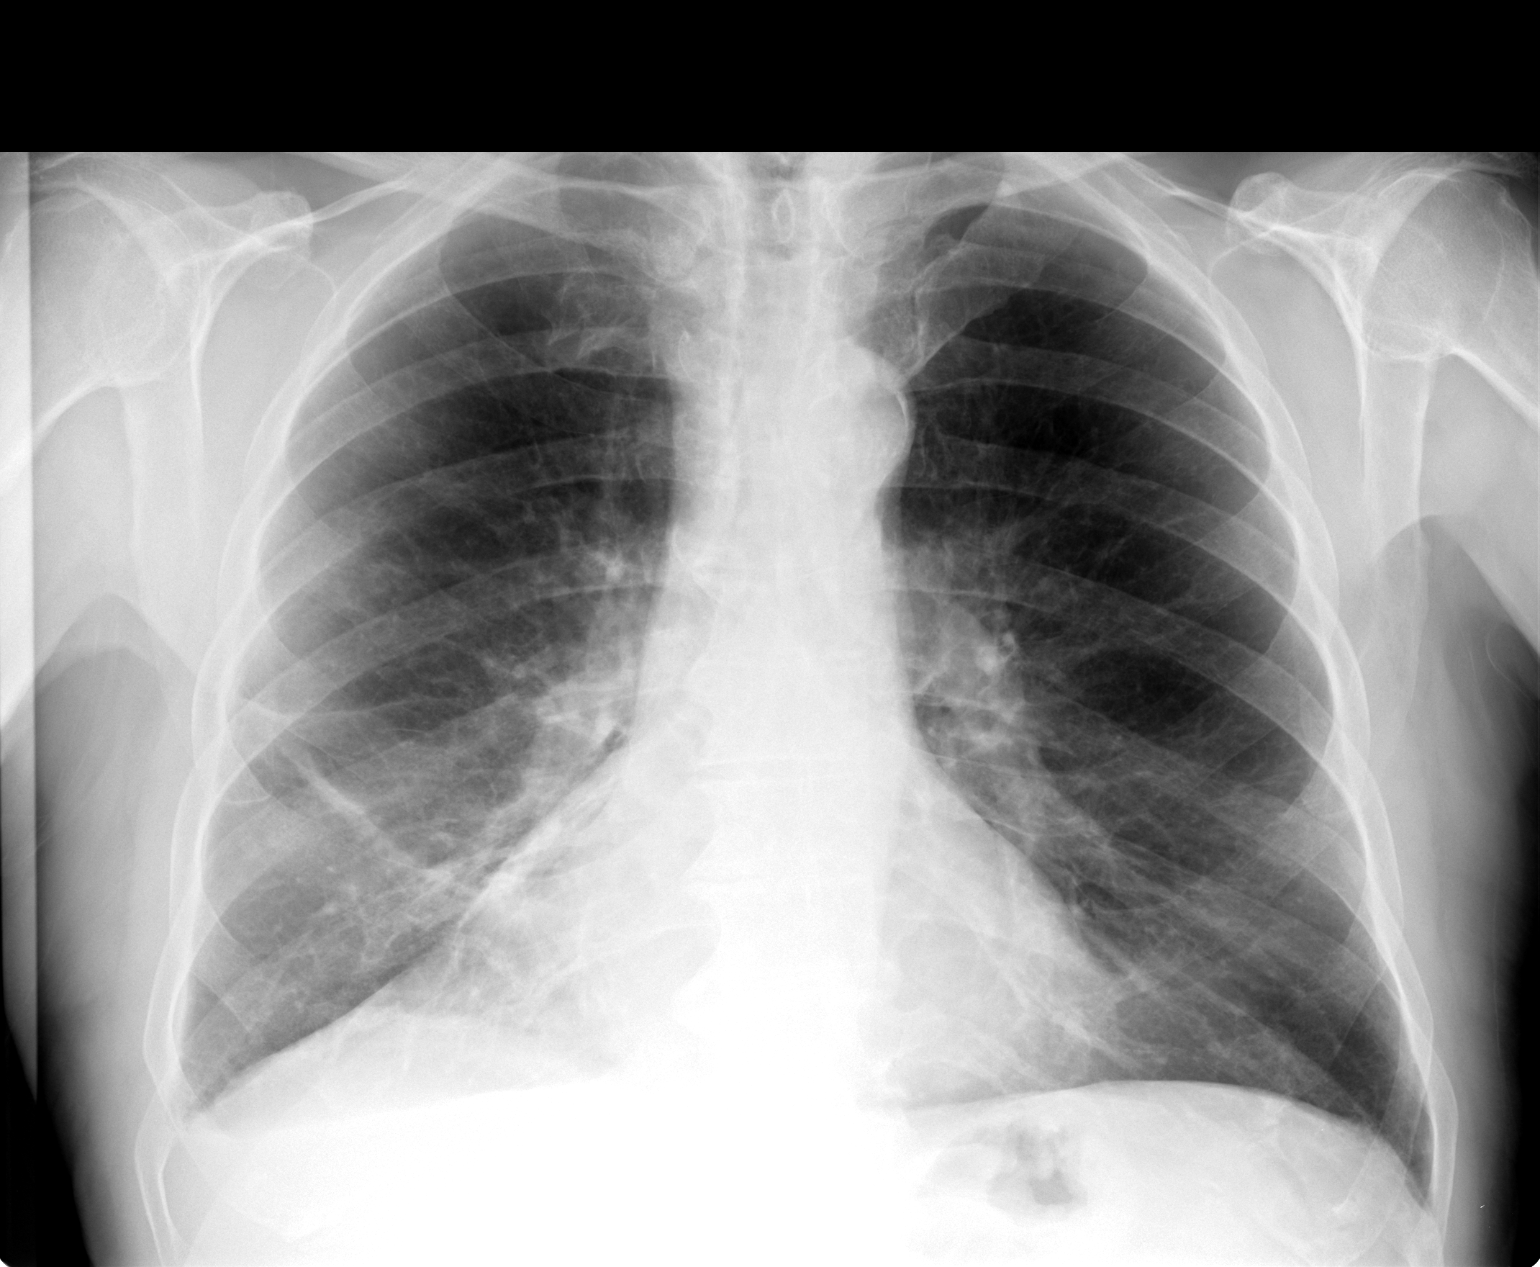

[view not recorded (2 of 2)]
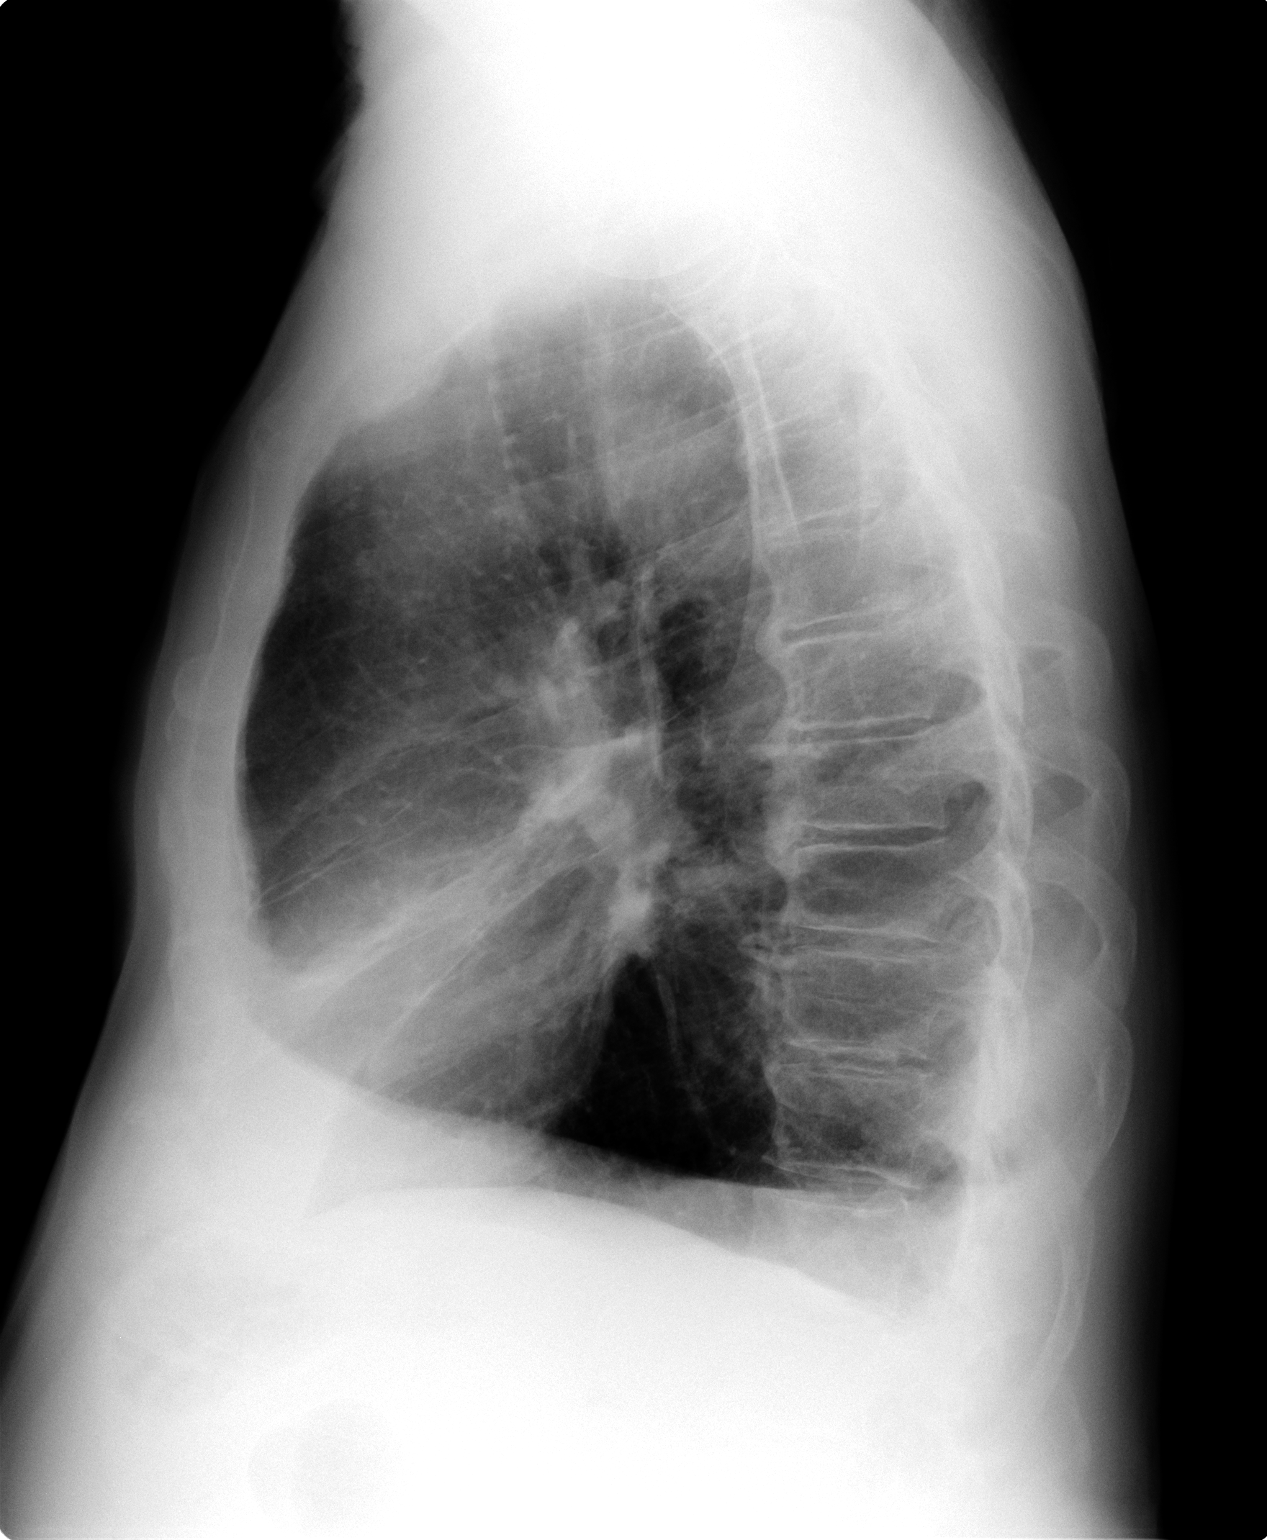

[2 of 2 positions shown; findings below may reference images not displayed]

FINDINGS: Linear scarring/atelectasis in the right middle lobe,
improved.  No focal consolidation. No pleural effusion or
pneumothorax.

The heart is normal in size.

Degenerative changes of the visualized thoracolumbar spine.
IMPRESSION: Linear scarring/atelectasis in the right middle lobe, improved.

## 2013-10-19 ENCOUNTER — Other Ambulatory Visit: Payer: Self-pay | Admitting: Internal Medicine

## 2013-11-10 IMAGING — CR DG CHEST 2V
2 series · 2 of 2 positions shown · non-contrast
Comparison: 04/13/2013 and 03/26/2013.

CLINICAL DATA: Follow up pneumonia. Ex-smoker.

EXAM:
CHEST  2 VIEW

[view not recorded (1 of 2)]
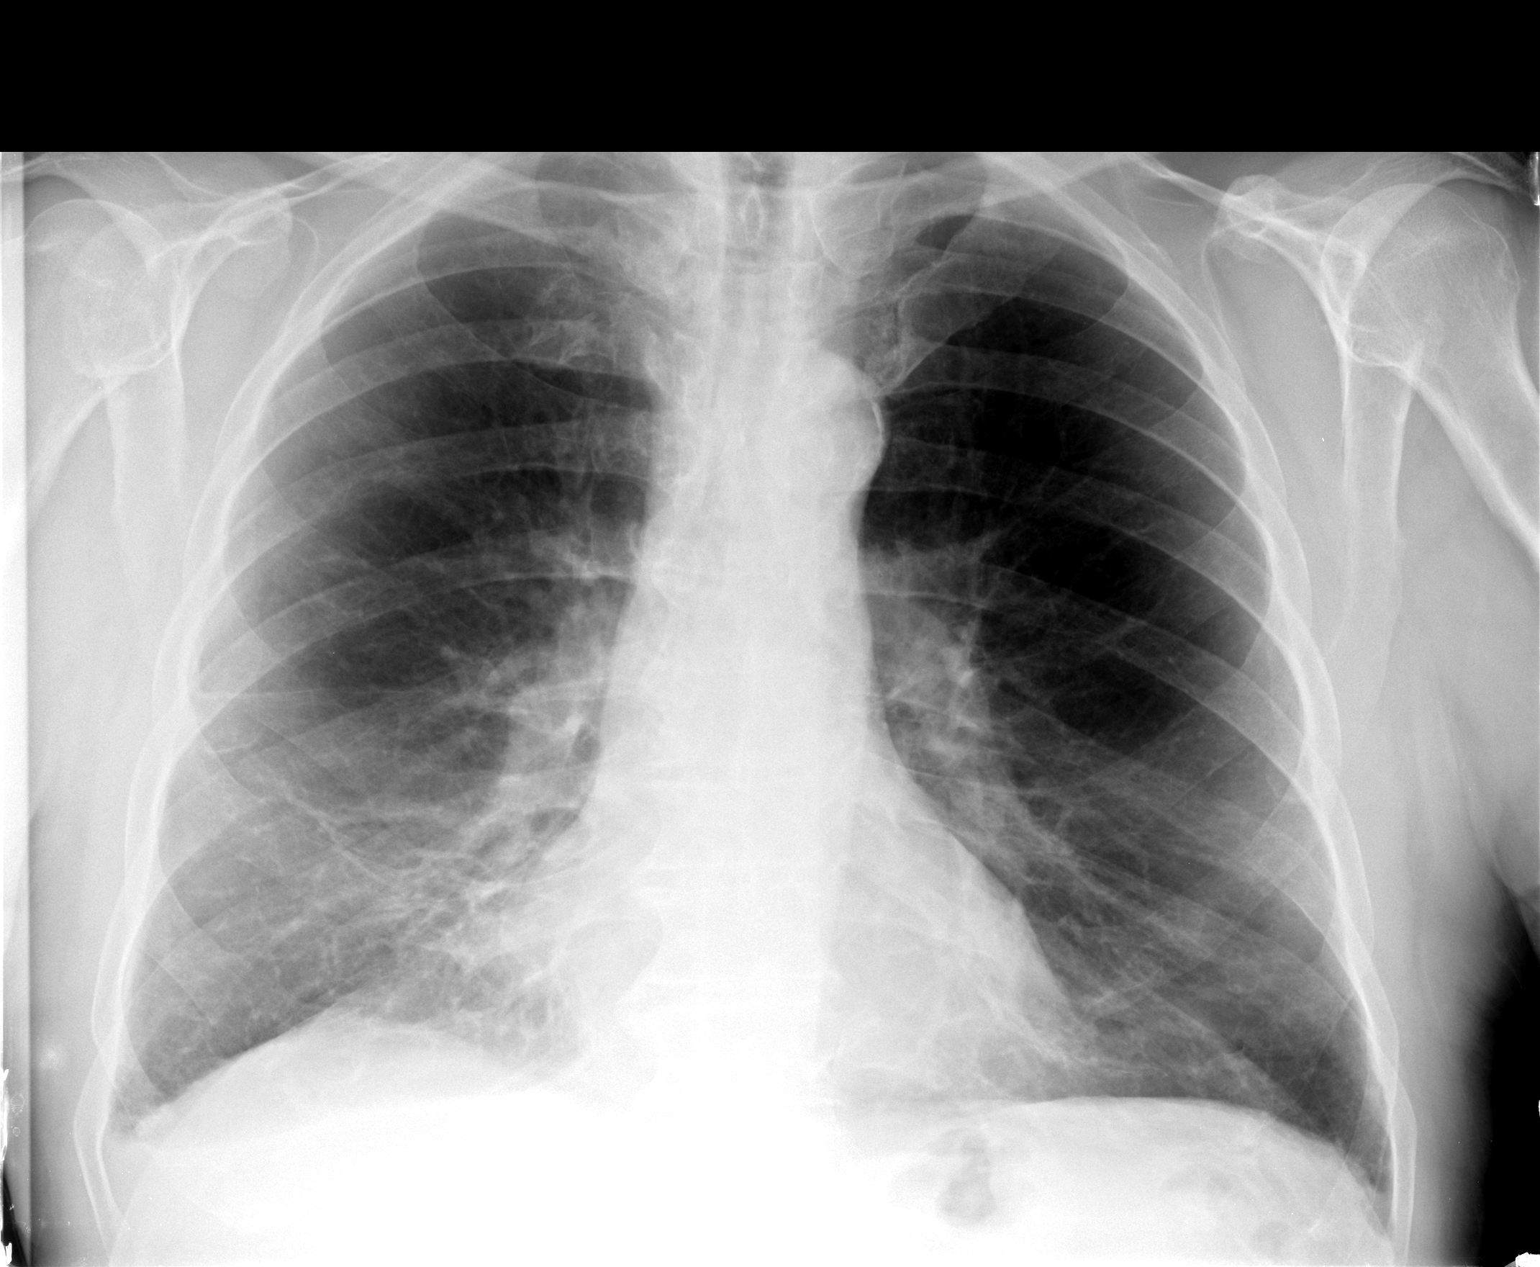

[view not recorded (2 of 2)]
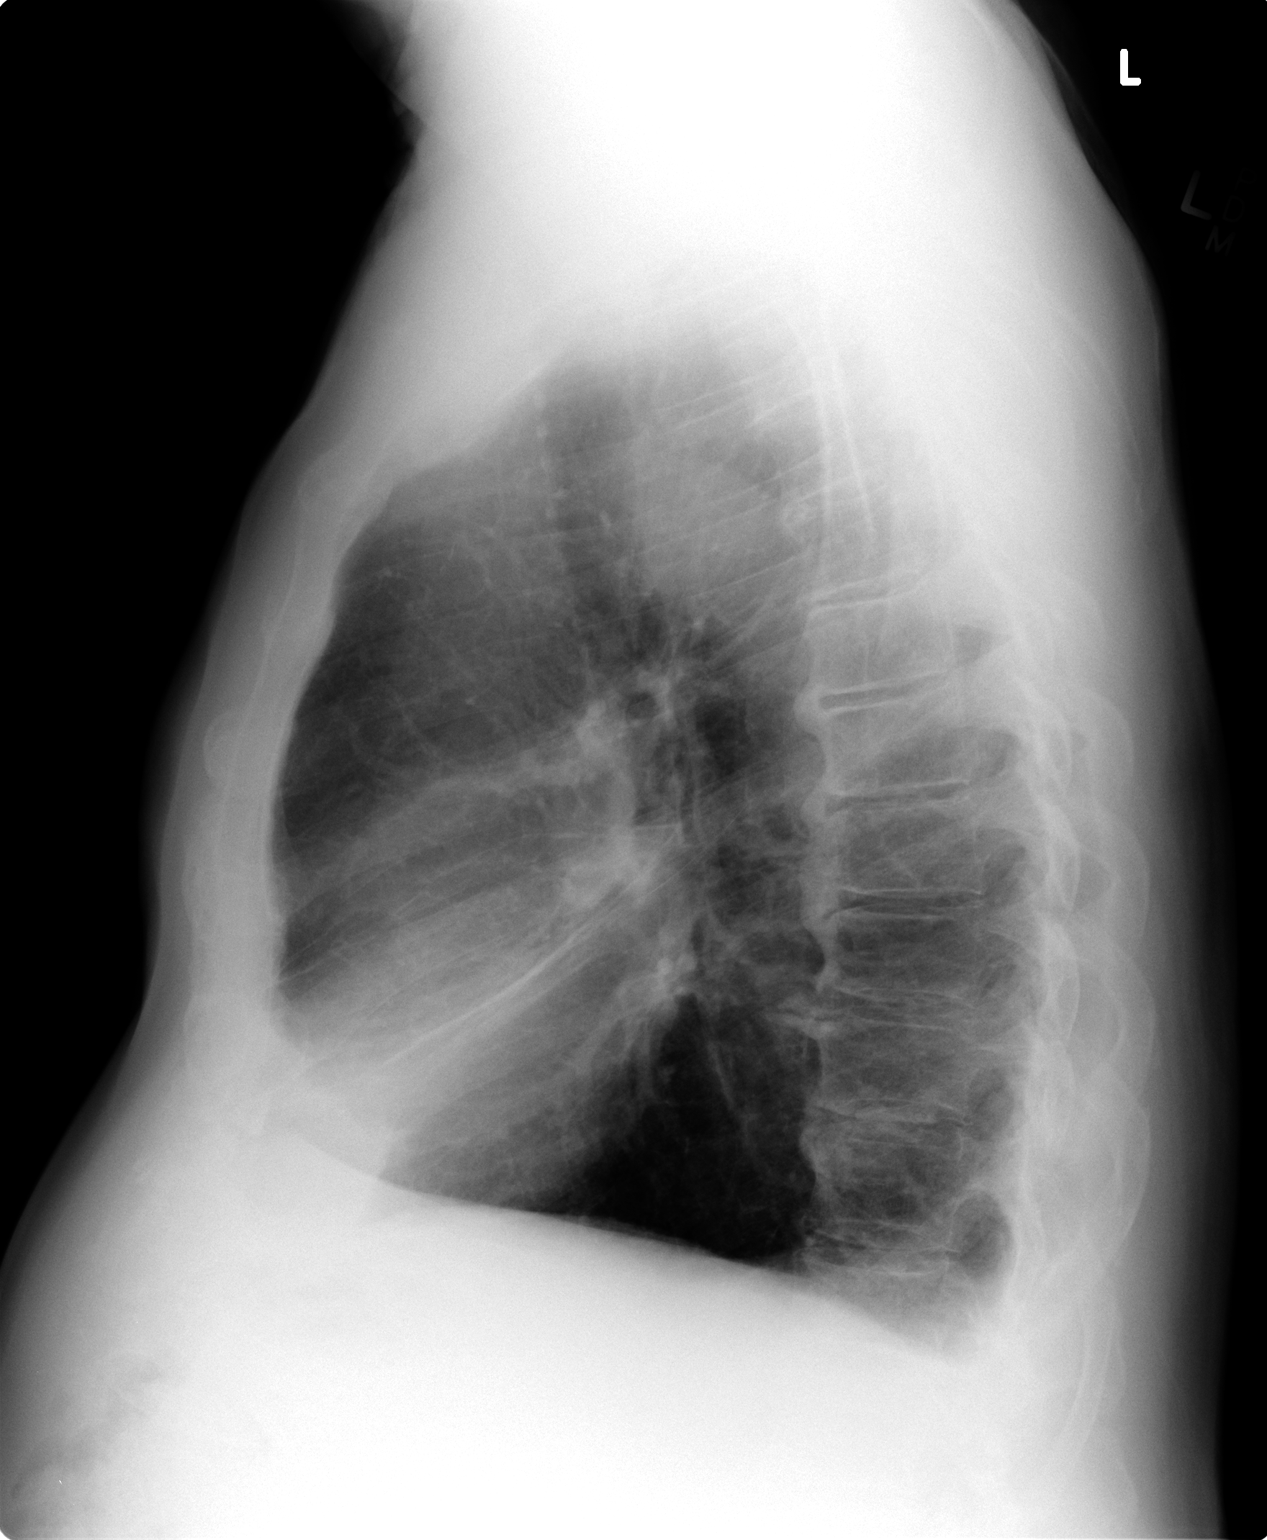

[2 of 2 positions shown; findings below may reference images not displayed]

FINDINGS: There is been further improved aeration of the right middle lobe
with mild residual linear atelectasis or scarring. There is no
airspace disease or edema. The left lung is clear. There is no
pleural effusion. The heart size and mediastinal contours are
stable.
IMPRESSION: Further improvement in right middle lobe density with mild residual
linear atelectasis or scarring. No evidence of pneumonia.

## 2014-03-12 ENCOUNTER — Ambulatory Visit (INDEPENDENT_AMBULATORY_CARE_PROVIDER_SITE_OTHER): Payer: Medicare HMO | Admitting: Internal Medicine

## 2014-03-12 ENCOUNTER — Encounter: Payer: Self-pay | Admitting: Internal Medicine

## 2014-03-12 VITALS — BP 140/68 | HR 77 | Temp 98.6°F | Wt 178.0 lb

## 2014-03-12 DIAGNOSIS — Z23 Encounter for immunization: Secondary | ICD-10-CM

## 2014-03-12 DIAGNOSIS — E1149 Type 2 diabetes mellitus with other diabetic neurological complication: Secondary | ICD-10-CM

## 2014-03-12 DIAGNOSIS — E785 Hyperlipidemia, unspecified: Secondary | ICD-10-CM

## 2014-03-12 DIAGNOSIS — E1142 Type 2 diabetes mellitus with diabetic polyneuropathy: Secondary | ICD-10-CM

## 2014-03-12 DIAGNOSIS — I10 Essential (primary) hypertension: Secondary | ICD-10-CM

## 2014-03-12 LAB — HEMOGLOBIN A1C: HEMOGLOBIN A1C: 7 % — AB (ref 4.6–6.5)

## 2014-03-12 NOTE — Assessment & Plan Note (Signed)
Hopefully still well controlled He doesn't check

## 2014-03-12 NOTE — Addendum Note (Signed)
Addended by: Despina Hidden on: 03/12/2014 11:14 AM   Modules accepted: Orders

## 2014-03-12 NOTE — Assessment & Plan Note (Signed)
Mild with symptoms only at night No meds needed

## 2014-03-12 NOTE — Assessment & Plan Note (Signed)
Lab Results  Component Value Date   LDLCALC 108* 09/10/2013   Reasonable control for age on statin. Not worth changing

## 2014-03-12 NOTE — Progress Notes (Signed)
Pre visit review using our clinic review tool, if applicable. No additional management support is needed unless otherwise documented below in the visit note. 

## 2014-03-12 NOTE — Progress Notes (Signed)
Subjective:    Patient ID: John Sanford, male    DOB: December 09, 1932, 80 y.o.   MRN: [id number]  HPI Doing well No new concerns  Has appt with eye doctor in 2 days Not checking sugars No hypoglycemic reactions Gets some leg discomfort at night--otherwise no pain or sores  No chest pain No SOB Stays active with large garden No dizziness or syncope No edema  Voids okay Nocturia is rare No daytime urgency --some increased frequency when up and about more  Current Outpatient Prescriptions on File Prior to Visit  Medication Sig Dispense Refill  . amLODipine (NORVASC) 5 MG tablet Take 1 tablet (5 mg total) by mouth daily.  90 tablet  3  . aspirin 81 MG tablet Take 81 mg by mouth daily.        Marland Kitchen losartan (COZAAR) 50 MG tablet Take 1 tablet (50 mg total) by mouth daily.  90 tablet  3  . metFORMIN (GLUCOPHAGE) 500 MG tablet TAKE 1 TABLET (500 MG TOTAL) BY MOUTH DAILY WITH BREAKFAST.  30 tablet  5  . pravastatin (PRAVACHOL) 80 MG tablet TAKE 1 TABLET BY MOUTH DAILY  90 tablet  3   No current facility-administered medications on file prior to visit.    No Known Allergies  Past Medical History  Diagnosis Date  . Diabetes mellitus   . Hyperlipidemia   . Hypertension   . Allergy   . BPH (benign prostatic hypertrophy)     Past Surgical History  Procedure Laterality Date  . Ventral hernia repair      periumbilical  . Tonsillectomy and adenoidectomy    . Prostate biopsy      negative    Family History  Problem Relation Age of Onset  . Heart disease Mother   . Diabetes Father   . Heart disease Father   . Diabetes Brother   . Heart disease Brother     History   Social History  . Marital Status: Widowed    Spouse Name: N/A    Number of Children: 3  . Years of Education: N/A   Occupational History  . retired Administrator    Social History Main Topics  . Smoking status: Former Smoker -- 2.00 packs/day for 20 years    Types: Cigarettes    Quit date:  08/16/1972  . Smokeless tobacco: Never Used  . Alcohol Use: No     Comment: rare  . Drug Use: No  . Sexual Activity: Not on file   Other Topics Concern  . Not on file   Social History Narrative   Has living will.    No health care POA---son or daughter   Would accept resuscitation--but no prolonged artificial ventilation   Would not want feeding tube if cognitively unaware   Review of Systems Appetite is great Weight is down 3# from last visit Sleeps fairly well    Objective:   Physical Exam  Constitutional: He appears well-developed and well-nourished. No distress.  Neck: Normal range of motion. Neck supple. No thyromegaly present.  Cardiovascular: Normal rate, regular rhythm, normal heart sounds and intact distal pulses.  Exam reveals no gallop.   No murmur heard. Pulmonary/Chest: Effort normal and breath sounds normal. No respiratory distress. He has no wheezes. He has no rales.  Musculoskeletal: He exhibits no edema and no tenderness.  Lymphadenopathy:    He has no cervical adenopathy.  Skin:  No foot lesions--thick skin on plantar surface with old bruising (he likes to walk  in bare feet despite my recommendations)  Psychiatric: He has a normal mood and affect. His behavior is normal.          Assessment & Plan:

## 2014-03-12 NOTE — Assessment & Plan Note (Signed)
BP Readings from Last 3 Encounters:  03/12/14 140/68  09/10/13 122/68  05/28/13 140/62   Good control still No changes needed

## 2014-03-13 ENCOUNTER — Encounter: Payer: Self-pay | Admitting: *Deleted

## 2014-03-14 LAB — HM DIABETES EYE EXAM

## 2014-03-20 ENCOUNTER — Other Ambulatory Visit: Payer: Self-pay | Admitting: *Deleted

## 2014-03-20 MED ORDER — METFORMIN HCL 500 MG PO TABS: ORAL_TABLET | ORAL | Status: DC

## 2014-04-11 ENCOUNTER — Encounter: Payer: Self-pay | Admitting: Internal Medicine

## 2014-04-19 ENCOUNTER — Other Ambulatory Visit: Payer: Self-pay | Admitting: Internal Medicine

## 2014-07-10 ENCOUNTER — Other Ambulatory Visit: Payer: Self-pay | Admitting: Internal Medicine

## 2014-09-05 ENCOUNTER — Other Ambulatory Visit: Payer: Self-pay | Admitting: Internal Medicine

## 2014-09-20 ENCOUNTER — Encounter: Payer: Self-pay | Admitting: Internal Medicine

## 2014-09-20 ENCOUNTER — Ambulatory Visit (INDEPENDENT_AMBULATORY_CARE_PROVIDER_SITE_OTHER): Payer: Medicare HMO | Admitting: Internal Medicine

## 2014-09-20 VITALS — BP 144/80 | HR 69 | Temp 97.4°F | Ht 67.5 in | Wt 182.5 lb

## 2014-09-20 DIAGNOSIS — Z23 Encounter for immunization: Secondary | ICD-10-CM

## 2014-09-20 DIAGNOSIS — L570 Actinic keratosis: Secondary | ICD-10-CM | POA: Diagnosis not present

## 2014-09-20 DIAGNOSIS — Z0000 Encounter for general adult medical examination without abnormal findings: Secondary | ICD-10-CM | POA: Diagnosis not present

## 2014-09-20 DIAGNOSIS — E785 Hyperlipidemia, unspecified: Secondary | ICD-10-CM | POA: Diagnosis not present

## 2014-09-20 DIAGNOSIS — I10 Essential (primary) hypertension: Secondary | ICD-10-CM

## 2014-09-20 DIAGNOSIS — Z7189 Other specified counseling: Secondary | ICD-10-CM | POA: Insufficient documentation

## 2014-09-20 DIAGNOSIS — N400 Enlarged prostate without lower urinary tract symptoms: Secondary | ICD-10-CM

## 2014-09-20 DIAGNOSIS — E1149 Type 2 diabetes mellitus with other diabetic neurological complication: Secondary | ICD-10-CM

## 2014-09-20 DIAGNOSIS — E1140 Type 2 diabetes mellitus with diabetic neuropathy, unspecified: Secondary | ICD-10-CM

## 2014-09-20 LAB — LIPID PANEL
CHOLESTEROL: 201 mg/dL — AB (ref 0–200)
HDL: 54.2 mg/dL (ref >39.00)
LDL CALC: 116 mg/dL — AB (ref 0–99)
NONHDL: 146.8
Total CHOL/HDL Ratio: 4
Triglycerides: 154 mg/dL — ABNORMAL HIGH (ref 0.0–149.0)
VLDL: 30.8 mg/dL (ref 0.0–40.0)

## 2014-09-20 LAB — T4, FREE: Free T4: 0.75 ng/dL (ref 0.60–1.60)

## 2014-09-20 LAB — COMPREHENSIVE METABOLIC PANEL
ALT: 16 U/L (ref 0–53)
AST: 17 U/L (ref 0–37)
Albumin: 4.2 g/dL (ref 3.5–5.2)
Alkaline Phosphatase: 56 U/L (ref 39–117)
BILIRUBIN TOTAL: 0.5 mg/dL (ref 0.2–1.2)
BUN: 21 mg/dL (ref 6–23)
CHLORIDE: 103 meq/L (ref 96–112)
CO2: 25 mEq/L (ref 19–32)
Calcium: 9.5 mg/dL (ref 8.4–10.5)
Creatinine, Ser: 1.25 mg/dL (ref 0.40–1.50)
GFR: 59.2 mL/min — AB (ref >60.00)
Glucose, Bld: 147 mg/dL — ABNORMAL HIGH (ref 70–99)
Potassium: 4.3 mEq/L (ref 3.5–5.1)
Sodium: 134 mEq/L — ABNORMAL LOW (ref 135–145)
TOTAL PROTEIN: 6.9 g/dL (ref 6.0–8.3)

## 2014-09-20 LAB — CBC WITH DIFFERENTIAL/PLATELET
BASOS PCT: 0.5 % (ref 0.0–3.0)
Basophils Absolute: 0 10*3/uL (ref 0.0–0.1)
EOS ABS: 0.2 10*3/uL (ref 0.0–0.7)
Eosinophils Relative: 2.7 % (ref 0.0–5.0)
HEMATOCRIT: 41.9 % (ref 39.0–52.0)
HEMOGLOBIN: 14.3 g/dL (ref 13.0–17.0)
Lymphocytes Relative: 34.9 % (ref 12.0–46.0)
Lymphs Abs: 2.5 10*3/uL (ref 0.7–4.0)
MCHC: 34.1 g/dL (ref 30.0–36.0)
MCV: 92.5 fl (ref 78.0–100.0)
MONO ABS: 0.6 10*3/uL (ref 0.1–1.0)
MONOS PCT: 8.6 % (ref 3.0–12.0)
NEUTROS PCT: 53.3 % (ref 43.0–77.0)
Neutro Abs: 3.8 10*3/uL (ref 1.4–7.7)
Platelets: 213 10*3/uL (ref 150.0–400.0)
RBC: 4.53 Mil/uL (ref 4.22–5.81)
RDW: 13.6 % (ref 11.5–15.5)
WBC: 7.1 10*3/uL (ref 4.0–10.5)

## 2014-09-20 LAB — HEMOGLOBIN A1C: HEMOGLOBIN A1C: 7.5 % — AB (ref 4.6–6.5)

## 2014-09-20 LAB — HM DIABETES FOOT EXAM

## 2014-09-20 NOTE — Assessment & Plan Note (Signed)
See social history Forms given 

## 2014-09-20 NOTE — Assessment & Plan Note (Signed)
Rx with liquid nitrogen 30 seconds x 2 for 6 lesions Tolerated well

## 2014-09-20 NOTE — Assessment & Plan Note (Signed)
BP Readings from Last 3 Encounters:  09/20/14 144/80  03/12/14 140/68  09/10/13 122/68   Slightly high but reasonable for age No changes

## 2014-09-20 NOTE — Assessment & Plan Note (Signed)
Hopefully still good control Due for labs 

## 2014-09-20 NOTE — Assessment & Plan Note (Signed)
Voids fair No changes needed

## 2014-09-20 NOTE — Assessment & Plan Note (Signed)
I have personally reviewed the Medicare Annual Wellness questionnaire and have noted 1. The patient's medical and social history 2. Their use of alcohol, tobacco or illicit drugs 3. Their current medications and supplements 4. The patient's functional ability including ADL's, fall risks, home safety risks and hearing or visual             impairment. 5. Diet and physical activities 6. Evidence for depression or mood disorders  The patients weight, height, BMI and visual acuity have been recorded in the chart I have made referrals, counseling and provided education to the patient based review of the above and I have provided the pt with a written personalized care plan for preventive services.  I have provided you with a copy of your personalized plan for preventive services. Please take the time to review along with your updated medication list.  Flu shot today Otherwise UTD No cancer screening due to age

## 2014-09-20 NOTE — Progress Notes (Signed)
Subjective:    Patient ID: John Sanford, male    DOB: April 20, 1933, 81 y.o.   MRN: [id number]  HPI Here for Medicare wellness and follow up of diabetes and other chronic medical conditions Reviewed form and advanced directives Reviewed other physician No falls No depression or anhedonia Stays active with large garden, wood, etc No problems with vision or hearing. Cataracts have affected his night vision. Independent with instrumental ADLs No alcohol or tobacco No cognitive problems  Hasn't been checking sugars Feet remain numb but no change. No sig pain---does soak them in warm water and that helps at times Eye exam last July No apparent hypoglycemia  No chest pain No SOB Mild edema in feet at end of the day No dizziness or syncope No headaches  Voids okay Nocturia x 1 at most No sig daytime urgency--does go frequently at times Slow stream and some dribbling  Current Outpatient Prescriptions on File Prior to Visit  Medication Sig Dispense Refill  . amLODipine (NORVASC) 5 MG tablet TAKE 1 TABLET BY MOUTH DAILY 90 tablet 0  . aspirin 81 MG tablet Take 81 mg by mouth daily.      Marland Kitchen losartan (COZAAR) 50 MG tablet TAKE 1 TABLET BY MOUTH DAILY 90 tablet 0  . metFORMIN (GLUCOPHAGE) 500 MG tablet TAKE 1 TABLET (500 MG TOTAL) BY MOUTH DAILY WITH BREAKFAST. 30 tablet 5  . pravastatin (PRAVACHOL) 80 MG tablet TAKE 1 TABLET BY MOUTH DAILY 90 tablet 3   No current facility-administered medications on file prior to visit.    No Known Allergies  Past Medical History  Diagnosis Date  . Diabetes mellitus   . Hyperlipidemia   . Hypertension   . Allergy   . BPH (benign prostatic hypertrophy)     Past Surgical History  Procedure Laterality Date  . Ventral hernia repair      periumbilical  . Tonsillectomy and adenoidectomy    . Prostate biopsy      negative    Family History  Problem Relation Age of Onset  . Heart disease Mother   . Diabetes Father   . Heart  disease Father   . Diabetes Brother   . Heart disease Brother     History   Social History  . Marital Status: Widowed    Spouse Name: N/A    Number of Children: 3  . Years of Education: N/A   Occupational History  . retired Administrator    Social History Main Topics  . Smoking status: Former Smoker -- 2.00 packs/day for 20 years    Types: Cigarettes    Quit date: 08/16/1972  . Smokeless tobacco: Never Used  . Alcohol Use: No     Comment: rare  . Drug Use: No  . Sexual Activity: Not on file   Other Topics Concern  . Not on file   Social History Narrative   Has living will.    No health care POA---son or daughter   Would accept resuscitation--but no prolonged artificial ventilation   Would not want feeding tube if cognitively unaware   Review of Systems Bowels are fine Sleep is usually okay. Occasionally awakens in middle of night-- has started the diphenyhydramine Has some recurrent scaly areas---actinics Full dentures Wears seat belt    Objective:   Physical Exam  Constitutional: He is oriented to person, place, and time. He appears well-developed and well-nourished. No distress.  HENT:  Mouth/Throat: Oropharynx is clear and moist. No oropharyngeal exudate.  Neck: Normal  range of motion. Neck supple. No thyromegaly present.  Cardiovascular: Normal rate, regular rhythm and normal heart sounds.  Exam reveals no gallop.   No murmur heard. Faint pedal pulses  Pulmonary/Chest: Effort normal and breath sounds normal. No respiratory distress. He has no wheezes. He has no rales.  Abdominal: Soft. There is no tenderness.  Musculoskeletal: He exhibits no edema or tenderness.  Lymphadenopathy:    He has no cervical adenopathy.  Neurological: He is alert and oriented to person, place, and time.  President-- "Obama, [place] Or [facility]" [phone]-...Marland KitchenMarland Kitchen. [id number] Recall 2/3  Decreased sensation in feet  Skin:  Actinic on dorsum of left hand-- 65mm 3-41mm on  left temple 2 small ones on right forearm  Psychiatric: He has a normal mood and affect. His behavior is normal.          Assessment & Plan:

## 2014-09-20 NOTE — Progress Notes (Signed)
Pre visit review using our clinic review tool, if applicable. No additional management support is needed unless otherwise documented below in the visit note. 

## 2014-09-20 NOTE — Patient Instructions (Signed)
Please try melatonin (over the counter) 3 or 6 mg instead of the diphenhydramine. This is safer. If you have to use the diphenhydramine, try to use it only occasionally.

## 2014-09-20 NOTE — Assessment & Plan Note (Signed)
No problems with statin 

## 2014-09-26 ENCOUNTER — Other Ambulatory Visit: Payer: Self-pay | Admitting: Internal Medicine

## 2014-12-04 ENCOUNTER — Other Ambulatory Visit: Payer: Self-pay | Admitting: Internal Medicine

## 2015-02-17 ENCOUNTER — Other Ambulatory Visit: Payer: Self-pay | Admitting: Internal Medicine

## 2015-03-21 ENCOUNTER — Ambulatory Visit (INDEPENDENT_AMBULATORY_CARE_PROVIDER_SITE_OTHER): Payer: Medicare HMO | Admitting: Internal Medicine

## 2015-03-21 ENCOUNTER — Encounter: Payer: Self-pay | Admitting: Internal Medicine

## 2015-03-21 VITALS — BP 148/70 | HR 75 | Temp 97.8°F | Wt 182.0 lb

## 2015-03-21 DIAGNOSIS — E1140 Type 2 diabetes mellitus with diabetic neuropathy, unspecified: Secondary | ICD-10-CM | POA: Diagnosis not present

## 2015-03-21 DIAGNOSIS — E785 Hyperlipidemia, unspecified: Secondary | ICD-10-CM

## 2015-03-21 DIAGNOSIS — G479 Sleep disorder, unspecified: Secondary | ICD-10-CM | POA: Diagnosis not present

## 2015-03-21 DIAGNOSIS — I10 Essential (primary) hypertension: Secondary | ICD-10-CM

## 2015-03-21 DIAGNOSIS — E1149 Type 2 diabetes mellitus with other diabetic neurological complication: Secondary | ICD-10-CM

## 2015-03-21 LAB — HEMOGLOBIN A1C: Hgb A1c MFr Bld: 7.7 % — ABNORMAL HIGH (ref 4.6–6.5)

## 2015-03-21 MED ORDER — LOSARTAN POTASSIUM 100 MG PO TABS: 100.0000 mg | ORAL_TABLET | Freq: Every day | ORAL | Status: DC

## 2015-03-21 NOTE — Progress Notes (Signed)
Pre visit review using our clinic review tool, if applicable. No additional management support is needed unless otherwise documented below in the visit note. 

## 2015-03-21 NOTE — Assessment & Plan Note (Signed)
BP Readings from Last 3 Encounters:  03/21/15 148/70  09/20/14 144/80  03/12/14 140/68   Will increase losartan to 100

## 2015-03-21 NOTE — Patient Instructions (Addendum)
I would not recommend more than 3mg  nightly for melatonin. Please increase the losartan to 100mg  daily---you can take 2 of the 50mg  size till they run out.

## 2015-03-21 NOTE — Progress Notes (Signed)
Subjective:    Patient ID: John Sanford, male    DOB: Aug 23, 1932, 81 y.o.   MRN: [id number]  HPI Here for follow up of diabetes and other chronic medical conditions Doing well  Still with some sleep problems Melatonin didn't work---got diarrhea Might have been a 10mg  dose though Will initiate but trouble staying asleep Lacks daytime energy  Not checking sugars No hypoglycemic reactions Feet still numb-- feel cold at night. No sig pain No foot ulcers Has appt for eye exam  No chest pain No SOB Active all day with garden  No problems with statin No clear myalgias---has some hip pain though  Current Outpatient Prescriptions on File Prior to Visit  Medication Sig Dispense Refill  . amLODipine (NORVASC) 5 MG tablet TAKE 1 TABLET BY MOUTH DAILY 90 tablet 3  . aspirin 81 MG tablet Take 81 mg by mouth daily.      Marland Kitchen losartan (COZAAR) 50 MG tablet TAKE 1 TABLET BY MOUTH DAILY 90 tablet 3  . metFORMIN (GLUCOPHAGE) 500 MG tablet TAKE 1 TABLET (500 MG TOTAL) BY MOUTH DAILY WITH BREAKFAST. 30 tablet 3  . pravastatin (PRAVACHOL) 80 MG tablet TAKE 1 TABLET BY MOUTH DAILY 90 tablet 3   No current facility-administered medications on file prior to visit.    No Known Allergies  Past Medical History  Diagnosis Date  . Diabetes mellitus   . Hyperlipidemia   . Hypertension   . Allergy   . BPH (benign prostatic hypertrophy)     Past Surgical History  Procedure Laterality Date  . Ventral hernia repair      periumbilical  . Tonsillectomy and adenoidectomy    . Prostate biopsy      negative    Family History  Problem Relation Age of Onset  . Heart disease Mother   . Diabetes Father   . Heart disease Father   . Diabetes Brother   . Heart disease Brother     History   Social History  . Marital Status: Widowed    Spouse Name: N/A  . Number of Children: 3  . Years of Education: N/A   Occupational History  . retired Administrator    Social History Main Topics    . Smoking status: Former Smoker -- 2.00 packs/day for 20 years    Types: Cigarettes    Quit date: 08/16/1972  . Smokeless tobacco: Never Used  . Alcohol Use: No     Comment: rare  . Drug Use: No  . Sexual Activity: Not on file   Other Topics Concern  . Not on file   Social History Narrative   Has living will.    No health care POA---son or daughter   Would accept resuscitation--but no prolonged artificial ventilation   Would not want feeding tube if cognitively unaware   Review of Systems  Appetite is fine Weight is stable No urinary problems-- nocturia only occasionally     Objective:   Physical Exam  Constitutional: He appears well-developed and well-nourished. No distress.  Neck: Normal range of motion. Neck supple. No thyromegaly present.  Cardiovascular: Normal rate, regular rhythm, normal heart sounds and intact distal pulses.  Exam reveals no gallop.   No murmur heard. Pulmonary/Chest: Effort normal and breath sounds normal. No respiratory distress. He has no wheezes. He has no rales.  Musculoskeletal: He exhibits no edema or tenderness.  Lymphadenopathy:    He has no cervical adenopathy.  Skin:  No foot lesions  Psychiatric: He has  a normal mood and affect. His behavior is normal.          Assessment & Plan:

## 2015-03-21 NOTE — Assessment & Plan Note (Signed)
He will try the lower dose of melatonin

## 2015-03-21 NOTE — Assessment & Plan Note (Signed)
Hopefully still good control Would increase metformin if over 8%

## 2015-03-21 NOTE — Assessment & Plan Note (Signed)
No trouble with statin

## 2015-03-24 ENCOUNTER — Encounter: Payer: Self-pay | Admitting: *Deleted

## 2015-06-17 ENCOUNTER — Other Ambulatory Visit: Payer: Self-pay | Admitting: Internal Medicine

## 2015-06-25 ENCOUNTER — Other Ambulatory Visit: Payer: Self-pay | Admitting: Internal Medicine

## 2015-07-17 DIAGNOSIS — E119 Type 2 diabetes mellitus without complications: Secondary | ICD-10-CM | POA: Diagnosis not present

## 2015-07-23 ENCOUNTER — Encounter: Payer: Self-pay | Admitting: Internal Medicine

## 2015-07-23 LAB — HM DIABETES EYE EXAM

## 2015-10-21 ENCOUNTER — Other Ambulatory Visit: Payer: Self-pay | Admitting: Internal Medicine

## 2015-10-21 ENCOUNTER — Encounter: Payer: Self-pay | Admitting: Internal Medicine

## 2015-10-21 ENCOUNTER — Ambulatory Visit (INDEPENDENT_AMBULATORY_CARE_PROVIDER_SITE_OTHER): Payer: Medicare HMO | Admitting: Internal Medicine

## 2015-10-21 VITALS — BP 132/72 | HR 86 | Temp 97.6°F | Ht 68.0 in | Wt 186.8 lb

## 2015-10-21 DIAGNOSIS — E785 Hyperlipidemia, unspecified: Secondary | ICD-10-CM | POA: Diagnosis not present

## 2015-10-21 DIAGNOSIS — I10 Essential (primary) hypertension: Secondary | ICD-10-CM | POA: Diagnosis not present

## 2015-10-21 DIAGNOSIS — N400 Enlarged prostate without lower urinary tract symptoms: Secondary | ICD-10-CM

## 2015-10-21 DIAGNOSIS — L570 Actinic keratosis: Secondary | ICD-10-CM | POA: Diagnosis not present

## 2015-10-21 DIAGNOSIS — Z0000 Encounter for general adult medical examination without abnormal findings: Secondary | ICD-10-CM | POA: Diagnosis not present

## 2015-10-21 DIAGNOSIS — E1149 Type 2 diabetes mellitus with other diabetic neurological complication: Secondary | ICD-10-CM

## 2015-10-21 DIAGNOSIS — G479 Sleep disorder, unspecified: Secondary | ICD-10-CM

## 2015-10-21 LAB — CBC WITH DIFFERENTIAL/PLATELET
BASOS PCT: 0.5 % (ref 0.0–3.0)
Basophils Absolute: 0 10*3/uL (ref 0.0–0.1)
EOS PCT: 1.5 % (ref 0.0–5.0)
Eosinophils Absolute: 0.1 10*3/uL (ref 0.0–0.7)
HCT: 40.1 % (ref 39.0–52.0)
Hemoglobin: 13.6 g/dL (ref 13.0–17.0)
LYMPHS ABS: 2.3 10*3/uL (ref 0.7–4.0)
Lymphocytes Relative: 33.4 % (ref 12.0–46.0)
MCHC: 33.9 g/dL (ref 30.0–36.0)
MCV: 93.3 fl (ref 78.0–100.0)
MONO ABS: 0.4 10*3/uL (ref 0.1–1.0)
MONOS PCT: 6.5 % (ref 3.0–12.0)
NEUTROS ABS: 4 10*3/uL (ref 1.4–7.7)
NEUTROS PCT: 58.1 % (ref 43.0–77.0)
PLATELETS: 232 10*3/uL (ref 150.0–400.0)
RBC: 4.3 Mil/uL (ref 4.22–5.81)
RDW: 14.5 % (ref 11.5–15.5)
WBC: 6.8 10*3/uL (ref 4.0–10.5)

## 2015-10-21 LAB — LIPID PANEL
CHOLESTEROL: 181 mg/dL (ref 0–200)
HDL: 50.8 mg/dL (ref >39.00)
LDL Cholesterol: 102 mg/dL — ABNORMAL HIGH (ref 0–99)
NONHDL: 130.1
Total CHOL/HDL Ratio: 4
Triglycerides: 142 mg/dL (ref 0.0–149.0)
VLDL: 28.4 mg/dL (ref 0.0–40.0)

## 2015-10-21 LAB — COMPREHENSIVE METABOLIC PANEL
ALK PHOS: 56 U/L (ref 39–117)
ALT: 15 U/L (ref 0–53)
AST: 18 U/L (ref 0–37)
Albumin: 4.3 g/dL (ref 3.5–5.2)
BUN: 17 mg/dL (ref 6–23)
CHLORIDE: 102 meq/L (ref 96–112)
CO2: 25 meq/L (ref 19–32)
Calcium: 9.8 mg/dL (ref 8.4–10.5)
Creatinine, Ser: 1.17 mg/dL (ref 0.40–1.50)
GFR: 63.72 mL/min (ref >60.00)
GLUCOSE: 185 mg/dL — AB (ref 70–99)
POTASSIUM: 4.6 meq/L (ref 3.5–5.1)
SODIUM: 137 meq/L (ref 135–145)
TOTAL PROTEIN: 7 g/dL (ref 6.0–8.3)
Total Bilirubin: 0.5 mg/dL (ref 0.2–1.2)

## 2015-10-21 LAB — HM DIABETES FOOT EXAM

## 2015-10-21 LAB — HEMOGLOBIN A1C: Hgb A1c MFr Bld: 8.1 % — ABNORMAL HIGH (ref 4.6–6.5)

## 2015-10-21 MED ORDER — GABAPENTIN 300 MG PO CAPS: 300.0000 mg | ORAL_CAPSULE | Freq: Every day | ORAL | Status: DC

## 2015-10-21 NOTE — Progress Notes (Signed)
Pre visit review using our clinic review tool, if applicable. No additional management support is needed unless otherwise documented below in the visit note. 

## 2015-10-21 NOTE — Assessment & Plan Note (Signed)
Mild symptoms--no Rx now

## 2015-10-21 NOTE — Assessment & Plan Note (Signed)
I have personally reviewed the Medicare Annual Wellness questionnaire and have noted 1. The patient's medical and social history 2. Their use of alcohol, tobacco or illicit drugs 3. Their current medications and supplements 4. The patient's functional ability including ADL's, fall risks, home safety risks and hearing or visual             impairment. 5. Diet and physical activities 6. Evidence for depression or mood disorders  The patients weight, height, BMI and visual acuity have been recorded in the chart I have made referrals, counseling and provided education to the patient based review of the above and I have provided the pt with a written personalized care plan for preventive services.  I have provided you with a copy of your personalized plan for preventive services. Please take the time to review along with your updated medication list.  No cancer screening due to age Needs flu shot in the fall Keeps fairly fit

## 2015-10-21 NOTE — Assessment & Plan Note (Addendum)
Rx with liquid nitrogen 30 seconds x 2  Tolerated well Treated benign keratosis on left temple also at his request   Treated with the liquid nitrogen No prescription given

## 2015-10-21 NOTE — Patient Instructions (Addendum)
Please get your flu shot in September or October. Try the gabapentin at bedtime for sleep and your feet----if 1 is too much (sleepy in morning), let me know. If 1 isn't helping after a few days, increase to 2 capsules.

## 2015-10-21 NOTE — Assessment & Plan Note (Signed)
No problems with statin 

## 2015-10-21 NOTE — Assessment & Plan Note (Signed)
Hopefully still good control Will start gabapentin--not much pain but may help sleep

## 2015-10-21 NOTE — Assessment & Plan Note (Signed)
Will try gabapentin If doesn't tolerate this, will consider trazodone

## 2015-10-21 NOTE — Progress Notes (Signed)
Subjective:    Patient ID: John Sanford, male    DOB: 29-Jul-1933, 82 y.o.   MRN: [id number]  HPI Here for Medicare wellness and follow up of chronic health conditions Reviewed form and advanced directives Reviewed other doctors No tobacco or alcohol Vision is fading--- recommended cataract extraction on right. He will consider this in the next few months. Hearing is mildly off--not enough for aides Physically active in yard/garden all the time No falls--is very careful due to decreased balance No depression or anhedonia Independent with instrumental ADLs No apparent cognitive problems  Still not tolerating the melatonin Causes diarrhea Will initiate fine--but then awakens after 4 hours and can't get back (just cat naps) Now may go 3-4 nights with a problem--- can be tired in the day Friend uses zolpidem---advised against this  Has several spots to be checked for treatment Scaly, come off then recur, etc No pain  Not checking sugars No hypoglycemic reactions Feet still numb---and worse. But not ready for medication  No chest pain No SOB No dizziness or syncope Tires out with his work  Voids okay Does have nocturia most nights now No sig daytime problems  Current Outpatient Prescriptions on File Prior to Visit  Medication Sig Dispense Refill  . amLODipine (NORVASC) 5 MG tablet TAKE 1 TABLET BY MOUTH DAILY 90 tablet 3  . aspirin 81 MG tablet Take 81 mg by mouth daily.      Marland Kitchen losartan (COZAAR) 100 MG tablet Take 1 tablet (100 mg total) by mouth daily. 90 tablet 3  . metFORMIN (GLUCOPHAGE) 500 MG tablet TAKE 1 TABLET (500 MG TOTAL) BY MOUTH DAILY WITH BREAKFAST. 90 tablet 3  . pravastatin (PRAVACHOL) 80 MG tablet TAKE 1 TABLET BY MOUTH DAILY 90 tablet 3   No current facility-administered medications on file prior to visit.    No Known Allergies  Past Medical History  Diagnosis Date  . Diabetes mellitus   . Hyperlipidemia   . Hypertension   . Allergy   .  BPH (benign prostatic hypertrophy)     Past Surgical History  Procedure Laterality Date  . Ventral hernia repair      periumbilical  . Tonsillectomy and adenoidectomy    . Prostate biopsy      negative    Family History  Problem Relation Age of Onset  . Heart disease Mother   . Diabetes Father   . Heart disease Father   . Diabetes Brother   . Heart disease Brother     Social History   Social History  . Marital Status: Widowed    Spouse Name: N/A  . Number of Children: 3  . Years of Education: N/A   Occupational History  . retired Administrator    Social History Main Topics  . Smoking status: Former Smoker -- 2.00 packs/day for 20 years    Types: Cigarettes    Quit date: 08/16/1972  . Smokeless tobacco: Never Used  . Alcohol Use: No     Comment: rare  . Drug Use: No  . Sexual Activity: Not on file   Other Topics Concern  . Not on file   Social History Narrative   Has living will.    No health care POA---son or daughter   Would accept resuscitation--but no prolonged artificial ventilation   Would not want feeding tube if cognitively unaware   Review of Systems Appetite is good Weight is stable Wears seat belt Full dentures--some sensitive gums Bowels are okay unless takes  melatonin. No blood in stool. No abdominal pain No rash Will get back aching after cutting wood    Objective:   Physical Exam  Constitutional: He is oriented to person, place, and time. He appears well-developed and well-nourished. No distress.  HENT:  Mouth/Throat: Oropharynx is clear and moist. No oropharyngeal exudate.  Neck: Normal range of motion. Neck supple. No thyromegaly present.  Cardiovascular: Normal rate, regular rhythm, normal heart sounds and intact distal pulses.  Exam reveals no gallop.   No murmur heard. Pulmonary/Chest: Effort normal and breath sounds normal. No respiratory distress. He has no wheezes. He has no rales.  Abdominal: Soft. There is no tenderness.    Musculoskeletal: He exhibits no edema.  Lymphadenopathy:    He has no cervical adenopathy.  Neurological: He is alert and oriented to person, place, and time.  President-- "Trump, Obama,Bush" 100-93-? Can't spell Recall 3/3  Decreased sensation in feet  Skin: No rash noted.  Small actinic right forearm and under lateral right eye No foot lesions  Psychiatric: He has a normal mood and affect. His behavior is normal.          Assessment & Plan:

## 2015-10-21 NOTE — Telephone Encounter (Signed)
Rx sent electronically.  

## 2015-10-21 NOTE — Assessment & Plan Note (Signed)
BP Readings from Last 3 Encounters:  10/21/15 132/72  03/21/15 148/70  09/20/14 144/80   Good control

## 2015-10-23 ENCOUNTER — Other Ambulatory Visit: Payer: Self-pay

## 2015-10-23 MED ORDER — METFORMIN HCL 500 MG PO TABS: 500.0000 mg | ORAL_TABLET | Freq: Two times a day (BID) | ORAL | Status: DC

## 2016-01-23 ENCOUNTER — Other Ambulatory Visit: Payer: Self-pay | Admitting: Internal Medicine

## 2016-04-21 ENCOUNTER — Ambulatory Visit (INDEPENDENT_AMBULATORY_CARE_PROVIDER_SITE_OTHER): Payer: Medicare HMO | Admitting: Internal Medicine

## 2016-04-21 ENCOUNTER — Encounter: Payer: Self-pay | Admitting: Internal Medicine

## 2016-04-21 VITALS — BP 110/70 | HR 65 | Temp 97.6°F | Wt 184.0 lb

## 2016-04-21 DIAGNOSIS — N400 Enlarged prostate without lower urinary tract symptoms: Secondary | ICD-10-CM

## 2016-04-21 DIAGNOSIS — L570 Actinic keratosis: Secondary | ICD-10-CM | POA: Diagnosis not present

## 2016-04-21 DIAGNOSIS — G479 Sleep disorder, unspecified: Secondary | ICD-10-CM

## 2016-04-21 DIAGNOSIS — I10 Essential (primary) hypertension: Secondary | ICD-10-CM

## 2016-04-21 DIAGNOSIS — Z23 Encounter for immunization: Secondary | ICD-10-CM

## 2016-04-21 DIAGNOSIS — E1149 Type 2 diabetes mellitus with other diabetic neurological complication: Secondary | ICD-10-CM | POA: Diagnosis not present

## 2016-04-21 LAB — HEMOGLOBIN A1C: Hgb A1c MFr Bld: 7.7 % — ABNORMAL HIGH (ref 4.6–6.5)

## 2016-04-21 NOTE — Progress Notes (Signed)
Subjective:    Patient ID: John Sanford, male    DOB: [birth year], 82 y.o.   MRN: [id number]  HPI Here for follow up of diabetes and other chronic health conditions  Not checking sugars No low sugar reactions Feet only mildly numb--and no sig pain  Sleeping slightly better if he goes to sleep at the right time Some groggy feeling in AM-takes 30 minutes till his mind clears some (better after coffee--and thinks it is related to age, not the med) Overall, sleep is better (but not great)  Now having nocturia x 1 Has daytime frequency--but not bad Some urgency and can dribble at times  No chest pain No SOB No dizziness or syncope--just feels "wobbly" in the morning No palpitations  Current Outpatient Prescriptions on File Prior to Visit  Medication Sig Dispense Refill  . amLODipine (NORVASC) 5 MG tablet TAKE 1 TABLET BY MOUTH DAILY 90 tablet 3  . aspirin 81 MG tablet Take 81 mg by mouth daily.      Marland Kitchen gabapentin (NEURONTIN) 300 MG capsule Take 1-2 capsules (300-600 mg total) by mouth at bedtime. 60 capsule 11  . losartan (COZAAR) 100 MG tablet Take 100 mg by mouth daily.    Marland Kitchen losartan (COZAAR) 100 MG tablet TAKE 1 TABLET BY MOUTH DAILY 90 tablet 2  . metFORMIN (GLUCOPHAGE) 500 MG tablet Take 1 tablet (500 mg total) by mouth 2 (two) times daily with a meal. 180 tablet 3  . pravastatin (PRAVACHOL) 80 MG tablet TAKE 1 TABLET BY MOUTH DAILY 90 tablet 3   No current facility-administered medications on file prior to visit.     No Known Allergies  Past Medical History:  Diagnosis Date  . Allergy   . BPH (benign prostatic hypertrophy)   . Diabetes mellitus   . Hyperlipidemia   . Hypertension     Past Surgical History:  Procedure Laterality Date  . PROSTATE BIOPSY     negative  . TONSILLECTOMY AND ADENOIDECTOMY    . VENTRAL HERNIA REPAIR     periumbilical    Family History  Problem Relation Age of Onset  . Heart disease Mother   . Diabetes Father   . Heart  disease Father   . Diabetes Brother   . Heart disease Brother     Social History   Social History  . Marital status: Widowed    Spouse name: N/A  . Number of children: 3  . Years of education: N/A   Occupational History  . retired Administrator    Social History Main Topics  . Smoking status: Former Smoker    Packs/day: 2.00    Years: 20.00    Types: Cigarettes    Quit date: 08/16/1972  . Smokeless tobacco: Never Used  . Alcohol use No     Comment: rare  . Drug use: No  . Sexual activity: Not on file   Other Topics Concern  . Not on file   Social History Narrative   Has living will.    No health care POA---son or daughter   Would accept resuscitation--but no prolonged artificial ventilation   Would not want feeding tube if cognitively unaware   Review of Systems Gets tired easy Gets edema by 4-5PM--better with elevation Appetite is okay Weight is stable    Objective:   Physical Exam  Constitutional: He appears well-developed and well-nourished. No distress.  Neck: Normal range of motion. Neck supple. No thyromegaly present.  Cardiovascular: Normal rate, regular rhythm, normal heart  sounds and intact distal pulses.  Exam reveals no gallop.   No murmur heard. Pulmonary/Chest: Effort normal and breath sounds normal. No respiratory distress. He has no wheezes. He has no rales.  Musculoskeletal: He exhibits no edema or tenderness.  Lymphadenopathy:    He has no cervical adenopathy.  Skin:  2 Actinics on right forearm Small flat papule right cheek Tiny skin tag on upper left back  No foot lesions  Psychiatric: He has a normal mood and affect. His behavior is normal.          Assessment & Plan:

## 2016-04-21 NOTE — Assessment & Plan Note (Signed)
BP Readings from Last 3 Encounters:  04/21/16 110/70  10/21/15 132/72  03/21/15 (!) 148/70   Good control

## 2016-04-21 NOTE — Addendum Note (Signed)
Addended by: Pilar Grammes on: 04/21/2016 12:25 PM   Modules accepted: Orders

## 2016-04-21 NOTE — Assessment & Plan Note (Signed)
Slightly better with the gabapentin

## 2016-04-21 NOTE — Progress Notes (Signed)
Pre visit review using our clinic review tool, if applicable. No additional management support is needed unless otherwise documented below in the visit note. 

## 2016-04-21 NOTE — Assessment & Plan Note (Signed)
2 treated on right forearm-- 30 seconds x 2 Benign lesion on right cheek also treated

## 2016-04-21 NOTE — Assessment & Plan Note (Signed)
Mild symptoms  No meds as yet

## 2016-04-21 NOTE — Assessment & Plan Note (Signed)
Hopefully still acceptable control (goal under 8%--action over 9%) Pain in feet less noticeable

## 2016-07-02 ENCOUNTER — Other Ambulatory Visit: Payer: Self-pay | Admitting: Internal Medicine

## 2016-10-15 ENCOUNTER — Other Ambulatory Visit: Payer: Self-pay | Admitting: Internal Medicine

## 2016-11-03 ENCOUNTER — Encounter: Payer: Self-pay | Admitting: Internal Medicine

## 2016-11-03 ENCOUNTER — Ambulatory Visit (INDEPENDENT_AMBULATORY_CARE_PROVIDER_SITE_OTHER): Payer: Medicare HMO | Admitting: Internal Medicine

## 2016-11-03 VITALS — BP 140/72 | HR 71 | Temp 97.6°F | Ht 67.5 in | Wt 182.0 lb

## 2016-11-03 DIAGNOSIS — Z0000 Encounter for general adult medical examination without abnormal findings: Secondary | ICD-10-CM

## 2016-11-03 DIAGNOSIS — Z7189 Other specified counseling: Secondary | ICD-10-CM

## 2016-11-03 DIAGNOSIS — E1149 Type 2 diabetes mellitus with other diabetic neurological complication: Secondary | ICD-10-CM

## 2016-11-03 DIAGNOSIS — E785 Hyperlipidemia, unspecified: Secondary | ICD-10-CM | POA: Diagnosis not present

## 2016-11-03 DIAGNOSIS — I10 Essential (primary) hypertension: Secondary | ICD-10-CM

## 2016-11-03 DIAGNOSIS — N400 Enlarged prostate without lower urinary tract symptoms: Secondary | ICD-10-CM | POA: Diagnosis not present

## 2016-11-03 LAB — CBC WITH DIFFERENTIAL/PLATELET
BASOS ABS: 0.1 10*3/uL (ref 0.0–0.1)
BASOS PCT: 0.8 % (ref 0.0–3.0)
EOS ABS: 0.5 10*3/uL (ref 0.0–0.7)
Eosinophils Relative: 6.8 % — ABNORMAL HIGH (ref 0.0–5.0)
HEMATOCRIT: 40.7 % (ref 39.0–52.0)
Hemoglobin: 13.7 g/dL (ref 13.0–17.0)
LYMPHS PCT: 33.6 % (ref 12.0–46.0)
Lymphs Abs: 2.3 10*3/uL (ref 0.7–4.0)
MCHC: 33.8 g/dL (ref 30.0–36.0)
MCV: 94.2 fl (ref 78.0–100.0)
MONO ABS: 0.6 10*3/uL (ref 0.1–1.0)
Monocytes Relative: 8.3 % (ref 3.0–12.0)
NEUTROS ABS: 3.4 10*3/uL (ref 1.4–7.7)
Neutrophils Relative %: 50.5 % (ref 43.0–77.0)
Platelets: 199 10*3/uL (ref 150.0–400.0)
RBC: 4.32 Mil/uL (ref 4.22–5.81)
RDW: 13.6 % (ref 11.5–15.5)
WBC: 6.7 10*3/uL (ref 4.0–10.5)

## 2016-11-03 LAB — HEMOGLOBIN A1C: Hgb A1c MFr Bld: 7.8 % — ABNORMAL HIGH (ref 4.6–6.5)

## 2016-11-03 LAB — COMPREHENSIVE METABOLIC PANEL
ALBUMIN: 4.3 g/dL (ref 3.5–5.2)
ALT: 12 U/L (ref 0–53)
AST: 16 U/L (ref 0–37)
Alkaline Phosphatase: 53 U/L (ref 39–117)
BUN: 20 mg/dL (ref 6–23)
CALCIUM: 9.5 mg/dL (ref 8.4–10.5)
CO2: 25 meq/L (ref 19–32)
Chloride: 103 mEq/L (ref 96–112)
Creatinine, Ser: 1.21 mg/dL (ref 0.40–1.50)
GFR: 61.14 mL/min (ref >60.00)
Glucose, Bld: 181 mg/dL — ABNORMAL HIGH (ref 70–99)
Potassium: 4.3 mEq/L (ref 3.5–5.1)
SODIUM: 134 meq/L — AB (ref 135–145)
Total Bilirubin: 0.6 mg/dL (ref 0.2–1.2)
Total Protein: 6.7 g/dL (ref 6.0–8.3)

## 2016-11-03 LAB — LIPID PANEL
Cholesterol: 167 mg/dL (ref 0–200)
HDL: 48.8 mg/dL (ref >39.00)
LDL Cholesterol: 92 mg/dL (ref 0–99)
NonHDL: 118.09
TRIGLYCERIDES: 129 mg/dL (ref 0.0–149.0)
Total CHOL/HDL Ratio: 3
VLDL: 25.8 mg/dL (ref 0.0–40.0)

## 2016-11-03 LAB — HM DIABETES FOOT EXAM

## 2016-11-03 LAB — T4, FREE: Free T4: 0.77 ng/dL (ref 0.60–1.60)

## 2016-11-03 NOTE — Assessment & Plan Note (Signed)
No problems with statin 

## 2016-11-03 NOTE — Progress Notes (Signed)
Pre visit review using our clinic review tool, if applicable. No additional management support is needed unless otherwise documented below in the visit note. 

## 2016-11-03 NOTE — Progress Notes (Signed)
Subjective:    Patient ID: John Sanford, male    DOB: 1933/01/24, 83 y.o.   MRN: [id number]  HPI Here for Medicare wellness and follow up of chronic health conditions Reviewed form and advanced directives Reviewed other doctors No tobacco or alcohol Having cataract surgery--has had dilated exam Hearing still poor --but not ready for aides No falls No depression or anhedonia Independent with instrumental ADLs Stays active Mild memory issues--- no recent decline  Stays tired Tried off the gabapentin--but had 3AM awakening so went back on Still has awakening around every other night Tries not to nap---but will occasionally Stays active-- wood in winter and garden in warm weather  Still doesn't check sugars No apparent hypoglycemic reactions Pain in feet is better. Not very numb either  Has more edema now--just indentation in socks No chest pain No SOB No dizziness or syncope No palpitations  No problems on statin No myalgias or GI problems  Noticing some left hip pain--down thigh Mostly if he tries to climb on bicycle (or throw leg up) No meds for this No problems with most activity  Voids okay Nocturia x 1 usually---down since on the gabapentin No daytime frequency--mild urgency though  Current Outpatient Prescriptions on File Prior to Visit  Medication Sig Dispense Refill  . amLODipine (NORVASC) 5 MG tablet TAKE 1 TABLET BY MOUTH DAILY 90 tablet 3  . aspirin 81 MG tablet Take 81 mg by mouth daily.      Marland Kitchen gabapentin (NEURONTIN) 300 MG capsule Take 1-2 capsules (300-600 mg total) by mouth at bedtime. 60 capsule 11  . losartan (COZAAR) 100 MG tablet TAKE 1 TABLET BY MOUTH DAILY 90 tablet 2  . metFORMIN (GLUCOPHAGE) 500 MG tablet TAKE 1 TABLET (500 MG TOTAL) BY MOUTH 2 (TWO) TIMES DAILY WITH A MEAL. 180 tablet 2  . pravastatin (PRAVACHOL) 80 MG tablet TAKE 1 TABLET BY MOUTH DAILY 90 tablet 1   No current facility-administered medications on file prior to  visit.     No Known Allergies  Past Medical History:  Diagnosis Date  . Allergy   . BPH (benign prostatic hypertrophy)   . Diabetes mellitus   . Hyperlipidemia   . Hypertension     Past Surgical History:  Procedure Laterality Date  . PROSTATE BIOPSY     negative  . TONSILLECTOMY AND ADENOIDECTOMY    . VENTRAL HERNIA REPAIR     periumbilical    Family History  Problem Relation Age of Onset  . Heart disease Mother   . Diabetes Father   . Heart disease Father   . Diabetes Brother   . Heart disease Brother     Social History   Social History  . Marital status: Widowed    Spouse name: N/A  . Number of children: 3  . Years of education: N/A   Occupational History  . retired Administrator    Social History Main Topics  . Smoking status: Former Smoker    Packs/day: 2.00    Years: 20.00    Types: Cigarettes    Quit date: 08/16/1972  . Smokeless tobacco: Never Used  . Alcohol use No     Comment: rare  . Drug use: No  . Sexual activity: Not on file   Other Topics Concern  . Not on file   Social History Narrative   Has living will.    Has health care POA---daughter then son   Would accept resuscitation--but no prolonged artificial ventilation   Would  not want feeding tube if cognitively unaware   Review of Systems Appetite is good Weight stable Wears seat belt Full dentures Bowels are slow at times--- uses softener daily. Laxative prn (senna). No blood Some moles on face--- no flaky ones. No heartburn or dysphagia No other joint pain    Objective:   Physical Exam  Constitutional: He is oriented to person, place, and time. He appears well-nourished. No distress.  HENT:  Mouth/Throat: Oropharynx is clear and moist. No oropharyngeal exudate.  Neck: No thyromegaly present.  Cardiovascular: Normal rate, regular rhythm, normal heart sounds and intact distal pulses.  Exam reveals no gallop.   No murmur heard. Pulmonary/Chest: Effort normal and breath sounds  normal. No respiratory distress. He has no wheezes. He has no rales.  Abdominal: Soft. There is no tenderness.  Musculoskeletal: He exhibits no edema or tenderness.  Lymphadenopathy:    He has no cervical adenopathy.  Neurological: He is alert and oriented to person, place, and time.  President--- "[place]" [id number] Doesn't spell Recall-- 3/3  Sensation intact to fine touch on feet  Skin:  Slight plantar callous--no ulcers  Psychiatric: He has a normal mood and affect. His behavior is normal.          Assessment & Plan:

## 2016-11-03 NOTE — Assessment & Plan Note (Signed)
I have personally reviewed the Medicare Annual Wellness questionnaire and have noted 1. The patient's medical and social history 2. Their use of alcohol, tobacco or illicit drugs 3. Their current medications and supplements 4. The patient's functional ability including ADL's, fall risks, home safety risks and hearing or visual             impairment. 5. Diet and physical activities 6. Evidence for depression or mood disorders  The patients weight, height, BMI and visual acuity have been recorded in the chart I have made referrals, counseling and provided education to the patient based review of the above and I have provided the pt with a written personalized care plan for preventive services.  I have provided you with a copy of your personalized plan for preventive services. Please take the time to review along with your updated medication list.  No cancer screening due to age Discussed fitness (he stays active) UTD on vaccines--yearly flu

## 2016-11-03 NOTE — Assessment & Plan Note (Signed)
Doing okay No meds needed

## 2016-11-03 NOTE — Assessment & Plan Note (Signed)
BP Readings from Last 3 Encounters:  11/03/16 140/72  04/21/16 110/70  10/21/15 132/72   Fairly good No change indicated

## 2016-11-03 NOTE — Assessment & Plan Note (Signed)
See social history 

## 2016-11-03 NOTE — Assessment & Plan Note (Signed)
Hopefully still good control Neuropathy pain better with night gabapentin

## 2016-12-03 ENCOUNTER — Other Ambulatory Visit: Payer: Self-pay | Admitting: Internal Medicine

## 2016-12-30 ENCOUNTER — Other Ambulatory Visit: Payer: Self-pay | Admitting: Internal Medicine

## 2017-01-24 ENCOUNTER — Other Ambulatory Visit: Payer: Self-pay | Admitting: Internal Medicine

## 2017-05-10 ENCOUNTER — Ambulatory Visit (INDEPENDENT_AMBULATORY_CARE_PROVIDER_SITE_OTHER): Payer: Medicare HMO | Admitting: Internal Medicine

## 2017-05-10 ENCOUNTER — Encounter: Payer: Self-pay | Admitting: Internal Medicine

## 2017-05-10 VITALS — BP 140/66 | HR 71 | Temp 97.7°F | Wt 181.0 lb

## 2017-05-10 DIAGNOSIS — E1149 Type 2 diabetes mellitus with other diabetic neurological complication: Secondary | ICD-10-CM

## 2017-05-10 DIAGNOSIS — I10 Essential (primary) hypertension: Secondary | ICD-10-CM

## 2017-05-10 DIAGNOSIS — Z23 Encounter for immunization: Secondary | ICD-10-CM

## 2017-05-10 DIAGNOSIS — N400 Enlarged prostate without lower urinary tract symptoms: Secondary | ICD-10-CM | POA: Diagnosis not present

## 2017-05-10 DIAGNOSIS — E785 Hyperlipidemia, unspecified: Secondary | ICD-10-CM

## 2017-05-10 LAB — HEMOGLOBIN A1C: HEMOGLOBIN A1C: 7.8 % — AB (ref 4.6–6.5)

## 2017-05-10 NOTE — Assessment & Plan Note (Signed)
BP Readings from Last 3 Encounters:  05/10/17 140/66  11/03/16 140/72  04/21/16 110/70   Reasonable control

## 2017-05-10 NOTE — Assessment & Plan Note (Signed)
Hopefully still good control Mostly numbness in feet--does okay with the gabapentin at night

## 2017-05-10 NOTE — Progress Notes (Signed)
Subjective:    Patient ID: John Sanford, male    DOB: [birth year], 83 y.o.   MRN: [id number]  HPI Here for follow up of diabetes and other chronic health conditions  Has kept up with his garden---but lost his crops to the rain and insects It does keep him busy though  Gets tired at times still Sleeping better with the gabapentin--helps neuropathy in feet Feels the dose is not too high---still has symptoms at night Is a little "off balance" in the morning  Still doesn't check sugars No hypoglycemic symptoms No significant pain in feet--mostly just the numbness  Notes swelling in feet as the evening comes Goes away overnight No SOB No chest pain No palpitations No dizziness or syncope  Current Outpatient Prescriptions on File Prior to Visit  Medication Sig Dispense Refill  . amLODipine (NORVASC) 5 MG tablet TAKE 1 TABLET BY MOUTH DAILY 90 tablet 3  . aspirin 81 MG tablet Take 81 mg by mouth daily.      Marland Kitchen gabapentin (NEURONTIN) 300 MG capsule Take 1-2 capsules (300-600 mg total) by mouth at bedtime. 60 capsule 11  . losartan (COZAAR) 100 MG tablet TAKE 1 TABLET BY MOUTH DAILY 90 tablet 2  . metFORMIN (GLUCOPHAGE) 500 MG tablet TAKE 1 TABLET (500 MG TOTAL) BY MOUTH 2 (TWO) TIMES DAILY WITH A MEAL. 180 tablet 2  . pravastatin (PRAVACHOL) 80 MG tablet TAKE 1 TABLET BY MOUTH DAILY 90 tablet 2   No current facility-administered medications on file prior to visit.     No Known Allergies  Past Medical History:  Diagnosis Date  . Allergy   . BPH (benign prostatic hypertrophy)   . Diabetes mellitus   . Hyperlipidemia   . Hypertension     Past Surgical History:  Procedure Laterality Date  . PROSTATE BIOPSY     negative  . TONSILLECTOMY AND ADENOIDECTOMY    . VENTRAL HERNIA REPAIR     periumbilical    Family History  Problem Relation Age of Onset  . Heart disease Mother   . Diabetes Father   . Heart disease Father   . Diabetes Brother   . Heart disease  Brother     Social History   Social History  . Marital status: Widowed    Spouse name: N/A  . Number of children: 3  . Years of education: N/A   Occupational History  . retired Administrator    Social History Main Topics  . Smoking status: Former Smoker    Packs/day: 2.00    Years: 20.00    Types: Cigarettes    Quit date: 08/16/1972  . Smokeless tobacco: Never Used  . Alcohol use No     Comment: rare  . Drug use: No  . Sexual activity: Not on file   Other Topics Concern  . Not on file   Social History Narrative   Has living will.    Has health care POA---daughter then son   Would accept resuscitation--but no prolonged artificial ventilation   Would not want feeding tube if cognitively unaware   Review of Systems Having cataract surgery soon---has to get evaluated Appetite is excellent Weight is down slightly Urine stream is slow--but still only occasional nocturia    Objective:   Physical Exam  Constitutional: He appears well-nourished. No distress.  Neck: No thyromegaly present.  Cardiovascular: Normal rate, regular rhythm, normal heart sounds and intact distal pulses.  Exam reveals no gallop.   No murmur heard. Pulmonary/Chest: Effort  normal and breath sounds normal. No respiratory distress. He has no wheezes. He has no rales.  Musculoskeletal: He exhibits no edema.  Lymphadenopathy:    He has no cervical adenopathy.  Skin:  No foot lesions          Assessment & Plan:

## 2017-05-10 NOTE — Assessment & Plan Note (Signed)
No problems with statin 

## 2017-05-10 NOTE — Assessment & Plan Note (Signed)
Mild symptoms only No Rx needed for now

## 2017-05-11 ENCOUNTER — Encounter: Payer: Self-pay | Admitting: *Deleted

## 2017-05-24 DIAGNOSIS — C44529 Squamous cell carcinoma of skin of other part of trunk: Secondary | ICD-10-CM | POA: Diagnosis not present

## 2017-05-24 DIAGNOSIS — L570 Actinic keratosis: Secondary | ICD-10-CM | POA: Diagnosis not present

## 2017-05-24 DIAGNOSIS — L814 Other melanin hyperpigmentation: Secondary | ICD-10-CM | POA: Diagnosis not present

## 2017-05-24 DIAGNOSIS — L568 Other specified acute skin changes due to ultraviolet radiation: Secondary | ICD-10-CM | POA: Diagnosis not present

## 2017-06-01 DIAGNOSIS — C44529 Squamous cell carcinoma of skin of other part of trunk: Secondary | ICD-10-CM | POA: Diagnosis not present

## 2017-06-08 DIAGNOSIS — E119 Type 2 diabetes mellitus without complications: Secondary | ICD-10-CM | POA: Diagnosis not present

## 2017-06-08 DIAGNOSIS — H524 Presbyopia: Secondary | ICD-10-CM | POA: Diagnosis not present

## 2017-06-08 LAB — HM DIABETES EYE EXAM

## 2017-06-23 ENCOUNTER — Encounter: Payer: Self-pay | Admitting: Internal Medicine

## 2017-07-17 ENCOUNTER — Other Ambulatory Visit: Payer: Self-pay | Admitting: Internal Medicine

## 2017-08-23 DIAGNOSIS — H2513 Age-related nuclear cataract, bilateral: Secondary | ICD-10-CM | POA: Diagnosis not present

## 2017-08-23 DIAGNOSIS — H25043 Posterior subcapsular polar age-related cataract, bilateral: Secondary | ICD-10-CM | POA: Diagnosis not present

## 2017-08-23 DIAGNOSIS — H25013 Cortical age-related cataract, bilateral: Secondary | ICD-10-CM | POA: Diagnosis not present

## 2017-08-23 DIAGNOSIS — H2511 Age-related nuclear cataract, right eye: Secondary | ICD-10-CM | POA: Diagnosis not present

## 2017-08-23 DIAGNOSIS — H18413 Arcus senilis, bilateral: Secondary | ICD-10-CM | POA: Diagnosis not present

## 2017-08-31 ENCOUNTER — Other Ambulatory Visit: Payer: Self-pay | Admitting: Internal Medicine

## 2017-09-30 ENCOUNTER — Other Ambulatory Visit: Payer: Self-pay | Admitting: Internal Medicine

## 2017-10-17 DIAGNOSIS — Z961 Presence of intraocular lens: Secondary | ICD-10-CM | POA: Diagnosis not present

## 2017-10-17 DIAGNOSIS — H2511 Age-related nuclear cataract, right eye: Secondary | ICD-10-CM | POA: Diagnosis not present

## 2017-10-17 DIAGNOSIS — H2512 Age-related nuclear cataract, left eye: Secondary | ICD-10-CM | POA: Diagnosis not present

## 2017-10-18 DIAGNOSIS — H2512 Age-related nuclear cataract, left eye: Secondary | ICD-10-CM | POA: Diagnosis not present

## 2017-10-27 ENCOUNTER — Other Ambulatory Visit: Payer: Self-pay | Admitting: Internal Medicine

## 2017-11-07 DIAGNOSIS — H52202 Unspecified astigmatism, left eye: Secondary | ICD-10-CM | POA: Diagnosis not present

## 2017-11-07 DIAGNOSIS — Z961 Presence of intraocular lens: Secondary | ICD-10-CM | POA: Diagnosis not present

## 2017-11-07 DIAGNOSIS — H524 Presbyopia: Secondary | ICD-10-CM | POA: Diagnosis not present

## 2017-11-07 DIAGNOSIS — H2512 Age-related nuclear cataract, left eye: Secondary | ICD-10-CM | POA: Diagnosis not present

## 2017-11-15 ENCOUNTER — Encounter: Payer: Self-pay | Admitting: Internal Medicine

## 2017-11-15 ENCOUNTER — Ambulatory Visit (INDEPENDENT_AMBULATORY_CARE_PROVIDER_SITE_OTHER): Payer: Medicare HMO | Admitting: Internal Medicine

## 2017-11-15 VITALS — BP 150/72 | HR 66 | Temp 97.3°F | Ht 67.5 in | Wt 181.8 lb

## 2017-11-15 DIAGNOSIS — E1149 Type 2 diabetes mellitus with other diabetic neurological complication: Secondary | ICD-10-CM | POA: Diagnosis not present

## 2017-11-15 DIAGNOSIS — N400 Enlarged prostate without lower urinary tract symptoms: Secondary | ICD-10-CM | POA: Diagnosis not present

## 2017-11-15 DIAGNOSIS — I10 Essential (primary) hypertension: Secondary | ICD-10-CM | POA: Diagnosis not present

## 2017-11-15 DIAGNOSIS — Z0000 Encounter for general adult medical examination without abnormal findings: Secondary | ICD-10-CM

## 2017-11-15 DIAGNOSIS — E785 Hyperlipidemia, unspecified: Secondary | ICD-10-CM | POA: Diagnosis not present

## 2017-11-15 DIAGNOSIS — Z23 Encounter for immunization: Secondary | ICD-10-CM | POA: Diagnosis not present

## 2017-11-15 DIAGNOSIS — Z7189 Other specified counseling: Secondary | ICD-10-CM

## 2017-11-15 LAB — CBC
HCT: 43.2 % (ref 39.0–52.0)
Hemoglobin: 14.5 g/dL (ref 13.0–17.0)
MCHC: 33.6 g/dL (ref 30.0–36.0)
MCV: 94.5 fl (ref 78.0–100.0)
PLATELETS: 204 10*3/uL (ref 150.0–400.0)
RBC: 4.58 Mil/uL (ref 4.22–5.81)
RDW: 13.5 % (ref 11.5–15.5)
WBC: 6.1 10*3/uL (ref 4.0–10.5)

## 2017-11-15 LAB — HEMOGLOBIN A1C: HEMOGLOBIN A1C: 8.2 % — AB (ref 4.6–6.5)

## 2017-11-15 LAB — HM DIABETES FOOT EXAM

## 2017-11-15 LAB — COMPREHENSIVE METABOLIC PANEL
ALT: 17 U/L (ref 0–53)
AST: 18 U/L (ref 0–37)
Albumin: 4.2 g/dL (ref 3.5–5.2)
Alkaline Phosphatase: 59 U/L (ref 39–117)
BUN: 16 mg/dL (ref 6–23)
CO2: 28 meq/L (ref 19–32)
Calcium: 9.5 mg/dL (ref 8.4–10.5)
Chloride: 101 mEq/L (ref 96–112)
Creatinine, Ser: 1.14 mg/dL (ref 0.40–1.50)
GFR: 65.32 mL/min (ref >60.00)
GLUCOSE: 179 mg/dL — AB (ref 70–99)
POTASSIUM: 4.5 meq/L (ref 3.5–5.1)
SODIUM: 135 meq/L (ref 135–145)
Total Bilirubin: 0.6 mg/dL (ref 0.2–1.2)
Total Protein: 7.2 g/dL (ref 6.0–8.3)

## 2017-11-15 LAB — LIPID PANEL
CHOL/HDL RATIO: 3
Cholesterol: 184 mg/dL (ref 0–200)
HDL: 52.6 mg/dL (ref >39.00)
LDL Cholesterol: 105 mg/dL — ABNORMAL HIGH (ref 0–99)
NONHDL: 131.11
Triglycerides: 132 mg/dL (ref 0.0–149.0)
VLDL: 26.4 mg/dL (ref 0.0–40.0)

## 2017-11-15 NOTE — Progress Notes (Signed)
Hearing Screening   125Hz  250Hz  500Hz  1000Hz  2000Hz  3000Hz  4000Hz  6000Hz  8000Hz   Right ear:   25 40 0  0    Left ear:   40 40 0  0    Vision Screening Comments: October 2018

## 2017-11-15 NOTE — Assessment & Plan Note (Signed)
I have personally reviewed the Medicare Annual Wellness questionnaire and have noted 1. The patient's medical and social history 2. Their use of alcohol, tobacco or illicit drugs 3. Their current medications and supplements 4. The patient's functional ability including ADL's, fall risks, home safety risks and hearing or visual             impairment. 5. Diet and physical activities 6. Evidence for depression or mood disorders  The patients weight, height, BMI and visual acuity have been recorded in the chart I have made referrals, counseling and provided education to the patient based review of the above and I have provided the pt with a written personalized care plan for preventive services.  I have provided you with a copy of your personalized plan for preventive services. Please take the time to review along with your updated medication list.  Yearly flu vaccine Pneumovax booster today No cancer screening due to age Discussed fitness

## 2017-11-15 NOTE — Addendum Note (Signed)
Addended by: Pilar Grammes on: 11/15/2017 10:32 AM   Modules accepted: Orders

## 2017-11-15 NOTE — Assessment & Plan Note (Signed)
Hopefully still reasonable control Goal to stay under 8%, action over 9% (or close) Mild neuropathy---uses the gabapentin to sleep

## 2017-11-15 NOTE — Assessment & Plan Note (Signed)
No problems with statin 

## 2017-11-15 NOTE — Assessment & Plan Note (Signed)
See social history 

## 2017-11-15 NOTE — Assessment & Plan Note (Signed)
BP Readings from Last 3 Encounters:  11/15/17 (!) 150/72  05/10/17 140/66  11/03/16 140/72   Slightly up today No action due to age (for now)

## 2017-11-15 NOTE — Assessment & Plan Note (Signed)
Slow stream but stable

## 2017-11-15 NOTE — Progress Notes (Signed)
Subjective:    Patient ID: John Sanford, male    DOB: Jul 09, 1933, 84 y.o.   MRN: [id number]  HPI Here for Medicare wellness and follow up of chronic health conditions Reviewed form and advanced directives Reviewed other doctors No tobacco now and no alcohol Stays active with yard and garden work Just had cataracts--vision is good. SCC removed last fall No falls No depression or anhedonia   Ongoing hip pain Especially bad with some of his garden work, Social research officer, government Just had cataracts done--so hasn't put garden in yet  Still doesn't check sugars No hypoglycemic reactions Ongoing foot numbness Easier swelling in the afternoon--but no sig pain  No chest pain  No SOB No palpitations No dizziness or syncope Slow decline in stamina  Voids okay Slow stream but stable Rare nocturia if he limits coffee   No problems with statin No myalgia but does get cramps No GI problems  Current Outpatient Medications on File Prior to Visit  Medication Sig Dispense Refill  . amLODipine (NORVASC) 5 MG tablet TAKE 1 TABLET BY MOUTH DAILY 90 tablet 3  . aspirin 81 MG tablet Take 81 mg by mouth daily.      Marland Kitchen gabapentin (NEURONTIN) 300 MG capsule TAKE 1-2 CAPSULES BY MOUTH AT BEDTIME 60 capsule 5  . losartan (COZAAR) 100 MG tablet TAKE 1 TABLET BY MOUTH DAILY 90 tablet 3  . metFORMIN (GLUCOPHAGE) 500 MG tablet TAKE 1 TABLET (500 MG TOTAL) BY MOUTH 2 (TWO) TIMES DAILY WITH A MEAL. 180 tablet 3  . pravastatin (PRAVACHOL) 80 MG tablet TAKE 1 TABLET BY MOUTH DAILY 90 tablet 0   No current facility-administered medications on file prior to visit.     No Known Allergies  Past Medical History:  Diagnosis Date  . Allergy   . BPH (benign prostatic hypertrophy)   . Diabetes mellitus   . Hyperlipidemia   . Hypertension     Past Surgical History:  Procedure Laterality Date  . PROSTATE BIOPSY     negative  . TONSILLECTOMY AND ADENOIDECTOMY    . VENTRAL HERNIA REPAIR     periumbilical     Family History  Problem Relation Age of Onset  . Heart disease Mother   . Diabetes Father   . Heart disease Father   . Diabetes Brother   . Heart disease Brother     Social History   Socioeconomic History  . Marital status: Widowed    Spouse name: Not on file  . Number of children: 3  . Years of education: Not on file  . Highest education level: Not on file  Occupational History  . Occupation: retired Investment banker, operational  . Financial resource strain: Not on file  . Food insecurity:    Worry: Not on file    Inability: Not on file  . Transportation needs:    Medical: Not on file    Non-medical: Not on file  Tobacco Use  . Smoking status: Former Smoker    Packs/day: 2.00    Years: 20.00    Pack years: 40.00    Types: Cigarettes    Last attempt to quit: 08/16/1972    Years since quitting: 45.2  . Smokeless tobacco: Never Used  Substance and Sexual Activity  . Alcohol use: No    Comment: rare  . Drug use: No  . Sexual activity: Not on file  Lifestyle  . Physical activity:    Days per week: Not on file    Minutes  per session: Not on file  . Stress: Not on file  Relationships  . Social connections:    Talks on phone: Not on file    Gets together: Not on file    Attends religious service: Not on file    Active member of club or organization: Not on file    Attends meetings of clubs or organizations: Not on file    Relationship status: Not on file  . Intimate partner violence:    Fear of current or ex partner: Not on file    Emotionally abused: Not on file    Physically abused: Not on file    Forced sexual activity: Not on file  Other Topics Concern  . Not on file  Social History Narrative   Has living will.    Has health care POA---daughter then son   Would accept resuscitation--but no prolonged artificial ventilation   Would not want feeding tube if cognitively unaware   Review of Systems No headaches Appetite is down from the past---but weight  stable Sleeps well with the gabapentin Wears seat belt Dentures--doesn't need dentist Bowels are okay with salads, etc. No blood No other skin lesions Occasional heartburn if he eats late--will use med rarely. No dysphagia No regular back pain    Objective:   Physical Exam  Constitutional: He is oriented to person, place, and time. He appears well-developed. No distress.  HENT:  Mouth/Throat: Oropharynx is clear and moist. No oropharyngeal exudate.  Neck: No thyromegaly present.  Cardiovascular: Normal rate, regular rhythm and normal heart sounds. Exam reveals no gallop.  No murmur heard. Faint pedal pulse left foot, strong on right  Pulmonary/Chest: Effort normal and breath sounds normal. No respiratory distress. He has no wheezes. He has no rales.  Abdominal: Soft. There is no tenderness.  Musculoskeletal: He exhibits no edema or tenderness.  Lymphadenopathy:    He has no cervical adenopathy.  Neurological: He is alert and oriented to person, place, and time.  President--- "Dwaine Deter, Bush" [phone]   "I'm not good at math" D-[id number] Recall 3/3  Mild decreased sensation in feet  Skin: No rash noted. No erythema.  No foot lesions  Psychiatric: He has a normal mood and affect. His behavior is normal.          Assessment & Plan:

## 2017-12-05 ENCOUNTER — Other Ambulatory Visit: Payer: Self-pay | Admitting: Internal Medicine

## 2017-12-24 ENCOUNTER — Other Ambulatory Visit: Payer: Self-pay | Admitting: Internal Medicine

## 2018-05-17 ENCOUNTER — Encounter: Payer: Self-pay | Admitting: Internal Medicine

## 2018-05-17 ENCOUNTER — Ambulatory Visit (INDEPENDENT_AMBULATORY_CARE_PROVIDER_SITE_OTHER): Payer: Medicare HMO | Admitting: Internal Medicine

## 2018-05-17 ENCOUNTER — Encounter (INDEPENDENT_AMBULATORY_CARE_PROVIDER_SITE_OTHER): Payer: Self-pay

## 2018-05-17 VITALS — BP 118/70 | HR 69 | Temp 97.6°F | Ht 68.0 in | Wt 184.0 lb

## 2018-05-17 DIAGNOSIS — I10 Essential (primary) hypertension: Secondary | ICD-10-CM

## 2018-05-17 DIAGNOSIS — E1149 Type 2 diabetes mellitus with other diabetic neurological complication: Secondary | ICD-10-CM | POA: Diagnosis not present

## 2018-05-17 DIAGNOSIS — Z23 Encounter for immunization: Secondary | ICD-10-CM

## 2018-05-17 DIAGNOSIS — R5383 Other fatigue: Secondary | ICD-10-CM

## 2018-05-17 LAB — POCT GLYCOSYLATED HEMOGLOBIN (HGB A1C): Hemoglobin A1C: 7.9 % — AB (ref 4.0–5.6)

## 2018-05-17 NOTE — Assessment & Plan Note (Signed)
Symptoms worrisome for CHF or cardiac--has to rest working in his garden more He has very large garden though Edema likely related to his using salt Discussed checking echo--he feels the decline is age related Asked him to let me know if he can't do more once the heat breaks (would check echo then)

## 2018-05-17 NOTE — Progress Notes (Signed)
Subjective:    Patient ID: John Sanford, male    DOB: [birth year], 84 y.o.   MRN: [id number]  HPI Here for follow up of diabetes and other chronic health conditions  He has noted more fatigue Has to have chairs at both ends of his garden---needs to rest Tries to work in the morning or evening--due to the heat  No chest pain No SOB Edema in feet by 3PM---has to loosen shoes Sleeps in bed---flat. No PND No palpitations  Not checking sugars No hypoglycemic reactions Same mild foot numbness--not painful  Current Outpatient Medications on File Prior to Visit  Medication Sig Dispense Refill  . amLODipine (NORVASC) 5 MG tablet TAKE 1 TABLET BY MOUTH DAILY 90 tablet 3  . aspirin 81 MG tablet Take 81 mg by mouth daily.      Marland Kitchen gabapentin (NEURONTIN) 300 MG capsule TAKE 1-2 CAPSULES BY MOUTH AT BEDTIME 60 capsule 5  . losartan (COZAAR) 100 MG tablet TAKE 1 TABLET BY MOUTH DAILY 90 tablet 3  . metFORMIN (GLUCOPHAGE) 500 MG tablet TAKE 1 TABLET (500 MG TOTAL) BY MOUTH 2 (TWO) TIMES DAILY WITH A MEAL. 180 tablet 3  . pravastatin (PRAVACHOL) 80 MG tablet TAKE 1 TABLET BY MOUTH DAILY 90 tablet 3   No current facility-administered medications on file prior to visit.     No Known Allergies  Past Medical History:  Diagnosis Date  . Allergy   . BPH (benign prostatic hypertrophy)   . Diabetes mellitus   . Hyperlipidemia   . Hypertension     Past Surgical History:  Procedure Laterality Date  . PROSTATE BIOPSY     negative  . TONSILLECTOMY AND ADENOIDECTOMY    . VENTRAL HERNIA REPAIR     periumbilical    Family History  Problem Relation Age of Onset  . Heart disease Mother   . Diabetes Father   . Heart disease Father   . Diabetes Brother   . Heart disease Brother     Social History   Socioeconomic History  . Marital status: Widowed    Spouse name: Not on file  . Number of children: 3  . Years of education: Not on file  . Highest education level: Not on file    Occupational History  . Occupation: retired Investment banker, operational  . Financial resource strain: Not on file  . Food insecurity:    Worry: Not on file    Inability: Not on file  . Transportation needs:    Medical: Not on file    Non-medical: Not on file  Tobacco Use  . Smoking status: Former Smoker    Packs/day: 2.00    Years: 20.00    Pack years: 40.00    Types: Cigarettes    Last attempt to quit: 08/16/1972    Years since quitting: 45.7  . Smokeless tobacco: Never Used  Substance and Sexual Activity  . Alcohol use: No    Comment: rare  . Drug use: No  . Sexual activity: Not on file  Lifestyle  . Physical activity:    Days per week: Not on file    Minutes per session: Not on file  . Stress: Not on file  Relationships  . Social connections:    Talks on phone: Not on file    Gets together: Not on file    Attends religious service: Not on file    Active member of club or organization: Not on file    Attends meetings  of clubs or organizations: Not on file    Relationship status: Not on file  . Intimate partner violence:    Fear of current or ex partner: Not on file    Emotionally abused: Not on file    Physically abused: Not on file    Forced sexual activity: Not on file  Other Topics Concern  . Not on file  Social History Narrative   Has living will.    Has health care POA---daughter then son   Would accept resuscitation--but no prolonged artificial ventilation   Would not want feeding tube if cognitively unaware   Review of Systems  Some left leg and hip---- better with rest. Especially an issue going up steps Somewhat "wobbly" first thing in the morning---better after his AM coffee Sleeps okay with the gabapentin. Uses meclizine if vertigo is worse Has had vertigo and vomiting in bed in some time Appetite is fine Weight stable    Objective:   Physical Exam  Constitutional: He appears well-developed. No distress.  Neck: No thyromegaly present.   Cardiovascular: Normal rate, regular rhythm, normal heart sounds and intact distal pulses. Exam reveals no gallop.  No murmur heard. Respiratory: Effort normal and breath sounds normal. No respiratory distress. He has no wheezes. He has no rales.  Musculoskeletal: He exhibits no edema.  Very little rotation of left hip--not really painful though  Lymphadenopathy:    He has no cervical adenopathy.  Psychiatric: He has a normal mood and affect. His behavior is normal.           Assessment & Plan:

## 2018-05-17 NOTE — Assessment & Plan Note (Signed)
BP Readings from Last 3 Encounters:  05/17/18 118/70  11/15/17 (!) 150/72  05/10/17 140/66   Good control

## 2018-05-17 NOTE — Assessment & Plan Note (Signed)
Lab Results  Component Value Date   HGBA1C 7.9 (A) 05/17/2018   Control is slightly better and certainly fine for his age Neuropathy controlled with evening gabapentin

## 2018-07-18 ENCOUNTER — Other Ambulatory Visit: Payer: Self-pay | Admitting: Internal Medicine

## 2018-07-18 DIAGNOSIS — D485 Neoplasm of uncertain behavior of skin: Secondary | ICD-10-CM | POA: Diagnosis not present

## 2018-07-18 DIAGNOSIS — L821 Other seborrheic keratosis: Secondary | ICD-10-CM | POA: Diagnosis not present

## 2018-07-18 DIAGNOSIS — H61001 Unspecified perichondritis of right external ear: Secondary | ICD-10-CM | POA: Diagnosis not present

## 2018-07-18 DIAGNOSIS — L02212 Cutaneous abscess of back [any part, except buttock]: Secondary | ICD-10-CM | POA: Diagnosis not present

## 2018-07-18 DIAGNOSIS — L570 Actinic keratosis: Secondary | ICD-10-CM | POA: Diagnosis not present

## 2018-08-25 DIAGNOSIS — L570 Actinic keratosis: Secondary | ICD-10-CM | POA: Diagnosis not present

## 2018-09-06 ENCOUNTER — Ambulatory Visit (INDEPENDENT_AMBULATORY_CARE_PROVIDER_SITE_OTHER): Payer: Medicare HMO | Admitting: Family Medicine

## 2018-09-06 ENCOUNTER — Encounter: Payer: Self-pay | Admitting: Family Medicine

## 2018-09-06 ENCOUNTER — Ambulatory Visit (INDEPENDENT_AMBULATORY_CARE_PROVIDER_SITE_OTHER)
Admission: RE | Admit: 2018-09-06 | Discharge: 2018-09-06 | Disposition: A | Discharge status: Home / Self Care | Payer: Medicare HMO | Source: Ambulatory Visit | Attending: Family Medicine | Admitting: Family Medicine

## 2018-09-06 VITALS — BP 160/56 | HR 89 | Temp 98.2°F | Ht 68.0 in | Wt 181.8 lb

## 2018-09-06 DIAGNOSIS — E1149 Type 2 diabetes mellitus with other diabetic neurological complication: Secondary | ICD-10-CM | POA: Diagnosis not present

## 2018-09-06 DIAGNOSIS — R05 Cough: Secondary | ICD-10-CM

## 2018-09-06 DIAGNOSIS — R059 Cough, unspecified: Secondary | ICD-10-CM

## 2018-09-06 DIAGNOSIS — J22 Unspecified acute lower respiratory infection: Secondary | ICD-10-CM | POA: Diagnosis not present

## 2018-09-06 DIAGNOSIS — I10 Essential (primary) hypertension: Secondary | ICD-10-CM | POA: Diagnosis not present

## 2018-09-06 MED ORDER — AZITHROMYCIN 250 MG PO TABS: ORAL_TABLET | ORAL | 0 refills | Status: DC

## 2018-09-06 NOTE — Patient Instructions (Addendum)
Chest xray today. I think you have bronchitis with persistent cough - treat with zpack antibiotic.  May take plain guaifenesin (plain mucinex) with plenty of water to help mobilize mucous.  Drink fluids to stay hydrated, get plenty of rest.  Let us know if fever >101, worsening productive cough, or not improving with treatment.

## 2018-09-06 NOTE — Assessment & Plan Note (Addendum)
Anticipate acute bronchitis, no obvious pneumonia heard on exam - will cover for atypical infection with azithromycin antibiotic. Given duration of symptoms, check CXR - some R sided scarring, will await radiology eval. Declines cough medicine. Supportive care reviewed as per instructions. Update if not improving with treatment. Pt agrees with plan.

## 2018-09-06 NOTE — Progress Notes (Addendum)
BP (!) 160/56 (BP Location: Right Arm, Patient Position: Sitting, Cuff Size: Normal)   Pulse 89   Temp 98.2 F (36.8 C) (Oral)   Ht 5\' 8"  (1.727 m)   Wt 181 lb 12 oz (82.4 kg)   SpO2 98%   BMI 27.63 kg/m   BP Readings from Last 3 Encounters:  09/06/18 (!) 160/56  05/17/18 118/70  11/15/17 (!) 150/72    CC: cough Subjective:    Patient ID: John Sanford, male    DOB: 08-Aug-1933, 85 y.o.   MRN: [id number]  HPI: John Sanford is a 85 y.o. male presenting on 09/06/2018 for Cough (C/o productive cough and chest congestion. Says hears rattling in his chest. Has to sleep sitting up. Sxs started about 1 mo ago. Tried OTC Flu HBP. Concerned he may have pneumonia. )   1 mo h/o productive cough, chest congestion, some wheezing. Trouble getting sputum out. Orthopnea - having to sleep with 2-3 pillows (normally sleeps with 1). Started with cough and sneezing. Initially green mucous then cleared up, now back to colored sputum. Sore throat and chest soreness from coughing. Some PNDrainage.   No fevers/chills, dyspnea, ear or tooth pain, headaches. No body aches.   H/o PNA 5 yrs ago - felt more ill at that time.   Treating with corcedin Flu HBP OTC (on second box). Also using vicks cough drops.   No sick contacts at home. Remote smoker - quit 1974.  Diabetic on metformin.     Relevant past medical, surgical, family and social history reviewed and updated as indicated. Interim medical history since our last visit reviewed. Allergies and medications reviewed and updated. Outpatient Medications Prior to Visit  Medication Sig Dispense Refill  . amLODipine (NORVASC) 5 MG tablet TAKE 1 TABLET BY MOUTH DAILY 90 tablet 3  . aspirin 81 MG tablet Take 81 mg by mouth daily.      Marland Kitchen gabapentin (NEURONTIN) 300 MG capsule TAKE 1-2 CAPSULES BY MOUTH AT BEDTIME 60 capsule 5  . losartan (COZAAR) 100 MG tablet TAKE 1 TABLET BY MOUTH DAILY 90 tablet 3  . metFORMIN (GLUCOPHAGE) 500 MG tablet  TAKE 1 TABLET (500 MG TOTAL) BY MOUTH 2 (TWO) TIMES DAILY WITH A MEAL. 180 tablet 3  . pravastatin (PRAVACHOL) 80 MG tablet TAKE 1 TABLET BY MOUTH DAILY 90 tablet 3   No facility-administered medications prior to visit.      Per HPI unless specifically indicated in ROS section below Review of Systems Objective:    BP (!) 160/56 (BP Location: Right Arm, Patient Position: Sitting, Cuff Size: Normal)   Pulse 89   Temp 98.2 F (36.8 C) (Oral)   Ht 5\' 8"  (1.727 m)   Wt 181 lb 12 oz (82.4 kg)   SpO2 98%   BMI 27.63 kg/m   Wt Readings from Last 3 Encounters:  09/06/18 181 lb 12 oz (82.4 kg)  05/17/18 184 lb (83.5 kg)  11/15/17 181 lb 12 oz (82.4 kg)    Physical Exam Vitals signs and nursing note reviewed.  Constitutional:      General: He is not in acute distress.    Appearance: He is well-developed.  HENT:     Head: Normocephalic and atraumatic.     Right Ear: Hearing, tympanic membrane, ear canal and external ear normal.     Left Ear: Hearing, tympanic membrane, ear canal and external ear normal.     Nose: Nose normal. No mucosal edema or rhinorrhea.  Right Sinus: No maxillary sinus tenderness or frontal sinus tenderness.     Left Sinus: No maxillary sinus tenderness or frontal sinus tenderness.     Mouth/Throat:     Pharynx: Uvula midline. No oropharyngeal exudate or posterior oropharyngeal erythema.     Tonsils: No tonsillar abscesses.  Eyes:     General: No scleral icterus.    Conjunctiva/sclera: Conjunctivae normal.     Pupils: Pupils are equal, round, and reactive to light.  Neck:     Musculoskeletal: Normal range of motion and neck supple.  Cardiovascular:     Rate and Rhythm: Normal rate and regular rhythm.     Heart sounds: Normal heart sounds. No murmur.  Pulmonary:     Effort: Pulmonary effort is normal. No respiratory distress.     Breath sounds: Transmitted upper airway sounds present. Decreased breath sounds and rhonchi (rhonchorous throughout, R>L bases)  present. No wheezing or rales.  Lymphadenopathy:     Cervical: No cervical adenopathy.  Skin:    General: Skin is warm and dry.     Findings: No rash.       Results for orders placed or performed in visit on 05/17/18  HgB A1c  Result Value Ref Range   Hemoglobin A1C 7.9 (A) 4.0 - 5.6 %   HbA1c POC (<> result, manual entry)     HbA1c, POC (prediabetic range)     HbA1c, POC (controlled diabetic range)     Lab Results  Component Value Date   CREATININE 1.14 11/15/2017   BUN 16 11/15/2017   NA 135 11/15/2017   K 4.5 11/15/2017   CL 101 11/15/2017   CO2 28 11/15/2017    Assessment & Plan:   Problem List Items Addressed This Visit    Type 2 diabetes mellitus with neurological manifestations, controlled ([place])   Essential hypertension, benign    Elevated reading today attributed to acute illness.       Acute respiratory infection - Primary    Anticipate acute bronchitis, no obvious pneumonia heard on exam - will cover for atypical infection with azithromycin antibiotic. Given duration of symptoms, check CXR - some R sided scarring, will await radiology eval. Declines cough medicine. Supportive care reviewed as per instructions. Update if not improving with treatment. Pt agrees with plan.       Relevant Medications   azithromycin (ZITHROMAX) 250 MG tablet    Other Visit Diagnoses    Cough       Relevant Orders   DG Chest 2 View       Meds ordered this encounter  Medications  . azithromycin (ZITHROMAX) 250 MG tablet    Sig: Take two tablets on day one followed by one tablet on days 2-5    Dispense:  6 each    Refill:  0   Orders Placed This Encounter  Procedures  . DG Chest 2 View    Standing Status:   Future    Number of Occurrences:   1    Standing Expiration Date:   11/05/2019    Order Specific Question:   Reason for Exam (SYMPTOM  OR DIAGNOSIS REQUIRED)    Answer:   1 mo cough, productive, rhonchorous    Order Specific Question:   Preferred imaging location?     Answer:   [facility]    Order Specific Question:   Radiology Contrast Protocol - do NOT remove file path    Answer:   [email or web address]   Patient Instructions  Chest xray today.  I think you have bronchitis with persistent cough - treat with zpack antibiotic.  May take plain guaifenesin (plain mucinex) with plenty of water to help mobilize mucous.  Drink fluids to stay hydrated, get plenty of rest.  Let us know if fever >101, worsening productive cough, or not improving with treatment.    Follow up plan: Return if symptoms worsen or fail to improve.  Ria Bush, MD

## 2018-09-06 NOTE — Assessment & Plan Note (Signed)
Elevated reading today attributed to acute illness.

## 2018-09-07 NOTE — Addendum Note (Signed)
Addended by: Ria Bush on: 09/07/2018 07:57 PM   Modules accepted: Level of Service

## 2018-10-12 ENCOUNTER — Other Ambulatory Visit: Payer: Self-pay | Admitting: Internal Medicine

## 2018-11-22 ENCOUNTER — Encounter: Payer: Medicare HMO | Admitting: Internal Medicine

## 2018-11-26 ENCOUNTER — Other Ambulatory Visit: Payer: Self-pay | Admitting: Internal Medicine

## 2018-12-15 ENCOUNTER — Other Ambulatory Visit: Payer: Self-pay | Admitting: Internal Medicine

## 2019-01-10 ENCOUNTER — Other Ambulatory Visit: Payer: Self-pay | Admitting: Internal Medicine

## 2019-01-10 NOTE — Telephone Encounter (Signed)
Let him know we are changing to a comparable medication

## 2019-01-10 NOTE — Telephone Encounter (Signed)
John Sanford notified by telephone of medication change.  Patient states understanding.

## 2019-01-10 NOTE — Telephone Encounter (Signed)
Pharmacy comment: Product Backordered/Unavailable: Losartan 100 AND 50MG  ON BACKORDER. PLEASE SEND SCRIPT FOR ALTERNATIVE.  They built rx for Benicar 100mg  as an alternative

## 2019-02-19 ENCOUNTER — Other Ambulatory Visit: Payer: Self-pay | Admitting: Internal Medicine

## 2019-02-28 ENCOUNTER — Ambulatory Visit: Payer: Self-pay | Admitting: *Deleted

## 2019-02-28 NOTE — Telephone Encounter (Signed)
Shirlean Mylar, from [facility] requested this patient to be triaged. The patient is c/o having some dizziness that started after he started taking Olmesartan. He stated that that it is mild and it happens when he tries to bend down to pick up something and comes straight back up. He denies nausea or room spinning. He wanted to know if this medication, Olmesartan could be cut in half because of the dizziness that he gets or recommendation. He stated he has been on this medication for about a month and half. He is requesting a call back regarding this. Will notify [facility] at [facility] for review and recommendation.   fReason for Disposition . [1] Caller has URGENT medication question about med that PCP or specialist prescribed AND [2] triager unable to answer question  Answer Assessment - Initial Assessment Questions 1.   NAME of MEDICATION: "What medicine are you calling about?"     Olmesartan  2.   QUESTION: "What is your question?"     He wants to know if he can cut this pill in half? 3.   PRESCRIBING HCP: "Who prescribed it?" Reason: if prescribed by specialist, call should be referred to that group.    Dr. Silvio Pate 4. SYMPTOMS: "Do you have any symptoms?"     yes 5. SEVERITY: If symptoms are present, ask "Are they mild, moderate or severe?"     Dizziness- mild 6.  PREGNANCY:  "Is there any chance that you are pregnant?" "When was your last menstrual period?"    n/a  Protocols used: MEDICATION QUESTION CALL-A-AH

## 2019-02-28 NOTE — Telephone Encounter (Signed)
It is possible - if it lowers blood pressure too much Check bp at home if possible  Hold tomorrow's dose until his PCP sees this, I will route

## 2019-02-28 NOTE — Telephone Encounter (Signed)
Pt notified as instructed and pt voiced understanding. Pt said he has not taken olmesartan today and will hold until hears back from Dr Silvio Pate on 03/01/19. Pt said he skipped a couple of days last wk taking the olmesartan and was not as dizzy but pt restarted olmesartan.Advised pt if bends over or stands from seated or laying position to get up slowly. Pt voiced understanding and says he does that anyway.ED precautions given and pt voiced understanding. FYI to Dr Silvio Pate.

## 2019-03-01 NOTE — Telephone Encounter (Signed)
Spoke to pt. He said he understands. He will let us know how it goes.

## 2019-03-01 NOTE — Telephone Encounter (Signed)
Spoke to pt. He said he feels much better today with no pill. He does not have a way to check his BP at home.

## 2019-03-01 NOTE — Telephone Encounter (Signed)
His BP has been somewhat elevated over the past few visits (variable). If he feels fine over the next 2 days, have him try the 1/2 tab daily (and if he still feels bad on that, he can stop it). He needs to keep the September visit so we can check it

## 2019-03-01 NOTE — Telephone Encounter (Signed)
Have him try cutting it in half as of tomorrow If he can check his BP----have him let us know what it is (with and without the medication)

## 2019-03-11 ENCOUNTER — Other Ambulatory Visit: Payer: Self-pay | Admitting: Internal Medicine

## 2019-04-25 ENCOUNTER — Ambulatory Visit (INDEPENDENT_AMBULATORY_CARE_PROVIDER_SITE_OTHER): Payer: Medicare HMO | Admitting: Internal Medicine

## 2019-04-25 ENCOUNTER — Encounter: Payer: Self-pay | Admitting: Internal Medicine

## 2019-04-25 ENCOUNTER — Other Ambulatory Visit: Payer: Self-pay

## 2019-04-25 VITALS — BP 132/66 | HR 76 | Temp 98.4°F | Ht 67.0 in | Wt 176.0 lb

## 2019-04-25 DIAGNOSIS — I10 Essential (primary) hypertension: Secondary | ICD-10-CM | POA: Diagnosis not present

## 2019-04-25 DIAGNOSIS — N400 Enlarged prostate without lower urinary tract symptoms: Secondary | ICD-10-CM

## 2019-04-25 DIAGNOSIS — Z0000 Encounter for general adult medical examination without abnormal findings: Secondary | ICD-10-CM | POA: Diagnosis not present

## 2019-04-25 DIAGNOSIS — E1149 Type 2 diabetes mellitus with other diabetic neurological complication: Secondary | ICD-10-CM

## 2019-04-25 DIAGNOSIS — Z7189 Other specified counseling: Secondary | ICD-10-CM | POA: Diagnosis not present

## 2019-04-25 DIAGNOSIS — Z23 Encounter for immunization: Secondary | ICD-10-CM | POA: Diagnosis not present

## 2019-04-25 LAB — HM DIABETES FOOT EXAM

## 2019-04-25 LAB — CBC
HCT: 39.1 % (ref 39.0–52.0)
Hemoglobin: 13.2 g/dL (ref 13.0–17.0)
MCHC: 33.7 g/dL (ref 30.0–36.0)
MCV: 94.1 fl (ref 78.0–100.0)
Platelets: 196 10*3/uL (ref 150.0–400.0)
RBC: 4.16 Mil/uL — ABNORMAL LOW (ref 4.22–5.81)
RDW: 13.9 % (ref 11.5–15.5)
WBC: 7 10*3/uL (ref 4.0–10.5)

## 2019-04-25 LAB — HEMOGLOBIN A1C: Hgb A1c MFr Bld: 8.8 % — ABNORMAL HIGH (ref 4.6–6.5)

## 2019-04-25 LAB — COMPREHENSIVE METABOLIC PANEL
ALT: 12 U/L (ref 0–53)
AST: 14 U/L (ref 0–37)
Albumin: 4.2 g/dL (ref 3.5–5.2)
Alkaline Phosphatase: 62 U/L (ref 39–117)
BUN: 23 mg/dL (ref 6–23)
CO2: 25 mEq/L (ref 19–32)
Calcium: 9.2 mg/dL (ref 8.4–10.5)
Chloride: 102 mEq/L (ref 96–112)
Creatinine, Ser: 1.21 mg/dL (ref 0.40–1.50)
GFR: 57.17 mL/min — ABNORMAL LOW (ref >60.00)
Glucose, Bld: 185 mg/dL — ABNORMAL HIGH (ref 70–99)
Potassium: 4.9 mEq/L (ref 3.5–5.1)
Sodium: 135 mEq/L (ref 135–145)
Total Bilirubin: 0.6 mg/dL (ref 0.2–1.2)
Total Protein: 6.6 g/dL (ref 6.0–8.3)

## 2019-04-25 LAB — LIPID PANEL
Cholesterol: 175 mg/dL (ref 0–200)
HDL: 48.4 mg/dL (ref >39.00)
LDL Cholesterol: 100 mg/dL — ABNORMAL HIGH (ref 0–99)
NonHDL: 126.18
Total CHOL/HDL Ratio: 4
Triglycerides: 131 mg/dL (ref 0.0–149.0)
VLDL: 26.2 mg/dL (ref 0.0–40.0)

## 2019-04-25 MED ORDER — LOSARTAN POTASSIUM 100 MG PO TABS: 100.0000 mg | ORAL_TABLET | Freq: Every day | ORAL | 3 refills | Status: DC

## 2019-04-25 NOTE — Progress Notes (Signed)
Subjective:    Patient ID: John Sanford, male    DOB: [birth year], 85 y.o.   MRN: [id number]  HPI Here for Medicare wellness visit and follow up of chronic health conditions Reviewed form and advanced directives Reviewed other doctors No alcohol or tobacco Stays active with his large garden Vision is good Hearing is not great--but not worse (doesn't want aides) No falls No depression or anhedonia Independent with instrumental ADLs  No sig memory loss  Has not done well with the benicar Has felt dizziness and bad throughout using it--has to sit on side of bed for a few minutes before being able to get up No chest pain or SOB No palpitations Some mild edema-gone every morning No headaches Hasn't cut back on work--but has to rest more  Doesn't check sugars No low sugar reactions--just on metformin Mild foot numbness--but no pain (gabapentin helps this at night)  Voids okay Nocturia x 1-2 Some urgency in the day--like if doing dishes  Continues on the statin No myalgias or GI problems Does get cramps though--if he is out in the garden a lot  Current Outpatient Medications on File Prior to Visit  Medication Sig Dispense Refill  . amLODipine (NORVASC) 5 MG tablet Take 1 tablet (5 mg total) by mouth daily. NEEDS OFFICE OR VIRTUAL VISIT 90 tablet 0  . aspirin 81 MG tablet Take 81 mg by mouth daily.      Marland Kitchen gabapentin (NEURONTIN) 300 MG capsule TAKE 1-2 CAPSULES BY MOUTH AT BEDTIME 60 capsule 5  . metFORMIN (GLUCOPHAGE) 500 MG tablet TAKE 1 TABLET (500 MG TOTAL) BY MOUTH 2 (TWO) TIMES DAILY WITH A MEAL. 180 tablet 3  . olmesartan (BENICAR) 40 MG tablet Please specify directions, refills and quantity (Patient taking differently: Take 20 mg by mouth daily. ) 90 tablet 3  . pravastatin (PRAVACHOL) 80 MG tablet TAKE 1 TABLET BY MOUTH EVERY DAY 90 tablet 0   No current facility-administered medications on file prior to visit.     No Known Allergies  Past Medical History:   Diagnosis Date  . Allergy   . BPH (benign prostatic hypertrophy)   . Diabetes mellitus   . Hyperlipidemia   . Hypertension     Past Surgical History:  Procedure Laterality Date  . PROSTATE BIOPSY     negative  . TONSILLECTOMY AND ADENOIDECTOMY    . VENTRAL HERNIA REPAIR     periumbilical    Family History  Problem Relation Age of Onset  . Heart disease Mother   . Diabetes Father   . Heart disease Father   . Diabetes Brother   . Heart disease Brother     Social History   Socioeconomic History  . Marital status: Widowed    Spouse name: Not on file  . Number of children: 3  . Years of education: Not on file  . Highest education level: Not on file  Occupational History  . Occupation: retired Investment banker, operational  . Financial resource strain: Not on file  . Food insecurity    Worry: Not on file    Inability: Not on file  . Transportation needs    Medical: Not on file    Non-medical: Not on file  Tobacco Use  . Smoking status: Former Smoker    Packs/day: 2.00    Years: 20.00    Pack years: 40.00    Types: Cigarettes    Quit date: 08/16/1972    Years since quitting: 46.7  .  Smokeless tobacco: Never Used  Substance and Sexual Activity  . Alcohol use: No    Comment: rare  . Drug use: No  . Sexual activity: Not on file  Lifestyle  . Physical activity    Days per week: Not on file    Minutes per session: Not on file  . Stress: Not on file  Relationships  . Social Herbalist on phone: Not on file    Gets together: Not on file    Attends religious service: Not on file    Active member of club or organization: Not on file    Attends meetings of clubs or organizations: Not on file    Relationship status: Not on file  . Intimate partner violence    Fear of current or ex partner: Not on file    Emotionally abused: Not on file    Physically abused: Not on file    Forced sexual activity: Not on file  Other Topics Concern  . Not on file  Social  History Narrative   Has living will.    Has health care POA---daughter then son   Would accept resuscitation--but no prolonged artificial ventilation   Would not want feeding tube if cognitively unaware   Review of Systems Appetite is decreasing---weight is down about 5# Sleeps fairly well ---occasional bad night but them makes up for it Wears seat belt Teeth are fine--full plates up and down Has a spot on his left lip--better if he shaves (?from heat) Bowels are better with increased water--no blood Rare heartburn---if he overeats. Tums helps. No dysphagia No regular back or joint pain. Back will feel it if he is up and down off the tractor too much    Objective:   Physical Exam  Constitutional: He is oriented to person, place, and time. He appears well-developed. No distress.  HENT:  Mouth/Throat: Oropharynx is clear and moist. No oropharyngeal exudate.  Neck: No thyromegaly present.  Cardiovascular: Normal rate, regular rhythm, normal heart sounds and intact distal pulses. Exam reveals no gallop.  No murmur heard. Respiratory: Effort normal and breath sounds normal. No respiratory distress. He has no wheezes. He has no rales.  GI: Soft. There is no abdominal tenderness.  Musculoskeletal:        General: No tenderness or edema.  Lymphadenopathy:    He has no cervical adenopathy.  Neurological: He is alert and oriented to person, place, and time.  President--- "Daisy Floro, Obama, [place] boy---Bush" [phone] [id number] Recall 3/3  slightly decreased sensation in feet  Skin: No rash noted. No erythema.  No foot lesions  Psychiatric: He has a normal mood and affect. His behavior is normal.           Assessment & Plan:

## 2019-04-25 NOTE — Assessment & Plan Note (Signed)
Mild symptoms--not enough to need meds

## 2019-04-25 NOTE — Assessment & Plan Note (Signed)
BP Readings from Last 3 Encounters:  04/25/19 132/66  09/06/18 (!) 160/56  05/17/18 118/70   Has done poorly with benicar Will switch back to losartan

## 2019-04-25 NOTE — Assessment & Plan Note (Signed)
I have personally reviewed the Medicare Annual Wellness questionnaire and have noted 1. The patient's medical and social history 2. Their use of alcohol, tobacco or illicit drugs 3. Their current medications and supplements 4. The patient's functional ability including ADL's, fall risks, home safety risks and hearing or visual             impairment. 5. Diet and physical activities 6. Evidence for depression or mood disorders  The patients weight, height, BMI and visual acuity have been recorded in the chart I have made referrals, counseling and provided education to the patient based review of the above and I have provided the pt with a written personalized care plan for preventive services.  I have provided you with a copy of your personalized plan for preventive services. Please take the time to review along with your updated medication list.  Flu vaccine today Stays in shape in his garden---discussed safety No cancer screening due to age

## 2019-04-25 NOTE — Assessment & Plan Note (Signed)
Hopefully still acceptable control Goal under 8%, action if up closer to or over 9% (add low dose glipizide) Mild neuropathy--gabapentin helps

## 2019-04-25 NOTE — Assessment & Plan Note (Signed)
See social history 

## 2019-05-14 ENCOUNTER — Other Ambulatory Visit: Payer: Self-pay | Admitting: Internal Medicine

## 2019-06-15 ENCOUNTER — Other Ambulatory Visit: Payer: Self-pay | Admitting: Internal Medicine

## 2019-07-13 ENCOUNTER — Other Ambulatory Visit: Payer: Self-pay | Admitting: Internal Medicine

## 2019-10-15 DIAGNOSIS — M25552 Pain in left hip: Secondary | ICD-10-CM | POA: Diagnosis not present

## 2019-10-15 DIAGNOSIS — M1612 Unilateral primary osteoarthritis, left hip: Secondary | ICD-10-CM | POA: Diagnosis not present

## 2019-10-23 ENCOUNTER — Telehealth: Payer: Self-pay | Admitting: Internal Medicine

## 2019-10-23 ENCOUNTER — Ambulatory Visit (INDEPENDENT_AMBULATORY_CARE_PROVIDER_SITE_OTHER): Payer: Medicare HMO | Admitting: Internal Medicine

## 2019-10-23 ENCOUNTER — Other Ambulatory Visit: Payer: Self-pay

## 2019-10-23 ENCOUNTER — Encounter: Payer: Self-pay | Admitting: Internal Medicine

## 2019-10-23 VITALS — BP 138/70 | HR 71 | Temp 97.8°F | Ht 67.0 in | Wt 173.0 lb

## 2019-10-23 DIAGNOSIS — M1612 Unilateral primary osteoarthritis, left hip: Secondary | ICD-10-CM | POA: Insufficient documentation

## 2019-10-23 DIAGNOSIS — I10 Essential (primary) hypertension: Secondary | ICD-10-CM

## 2019-10-23 DIAGNOSIS — E1149 Type 2 diabetes mellitus with other diabetic neurological complication: Secondary | ICD-10-CM

## 2019-10-23 LAB — POCT GLYCOSYLATED HEMOGLOBIN (HGB A1C): Hemoglobin A1C: 8 % — AB (ref 4.0–5.6)

## 2019-10-23 NOTE — Assessment & Plan Note (Signed)
Lab Results  Component Value Date   HGBA1C 8.0 (A) 10/23/2019   Better now I would hold off on more meds---unless Dr Rhona Raider won't do surgery with A1c above 7.5% Mild neuropathy with gabapentin Rx

## 2019-10-23 NOTE — Telephone Encounter (Signed)
Opened in error

## 2019-10-23 NOTE — Assessment & Plan Note (Signed)
Severe and surgery seems to be appropriate Fairly healthy I would prefer not to add meds to get his A1c to 7.5% at his age---will send note to Dr Rhona Raider to see if this is okay

## 2019-10-23 NOTE — Progress Notes (Signed)
Subjective:    Patient ID: John Sanford, male    DOB: 03-30-1933, 86 y.o.   MRN: [id number]  HPI Here for follow up of diabetes and other chronic health conditions This visit occurred during the SARS-CoV-2 public health emergency.  Safety protocols were in place, including screening questions prior to the visit, additional usage of staff PPE, and extensive cleaning of exam room while observing appropriate contact time as indicated for disinfecting solutions.   Has been to the orthopedist Rhona Raider) Told he needs left THR Did try cortisone shot--not clearly helpful Still works in his garden for 2-3 hours--then has to stop due to pain Doesn't use any medications Does limp some--and does tire out easy Has had cramp in right leg---probably from using it more to compensate for bad right leg  Not checking sugars Some balance problems--likely related to neuropathy Some slight nerve pain at night--and they feel cold at times Still on gabapentin  Doesn't check BP Doing okay back on the losartan No chest pain or SOB  Current Outpatient Medications on File Prior to Visit  Medication Sig Dispense Refill  . amLODipine (NORVASC) 5 MG tablet TAKE 1 TABLET BY MOUTH DAILY. NEED OFFICE OR VIRTUAL VISIT. 90 tablet 3  . aspirin 81 MG tablet Take 81 mg by mouth daily.      Marland Kitchen gabapentin (NEURONTIN) 300 MG capsule TAKE 1-2 CAPSULES BY MOUTH AT BEDTIME 60 capsule 5  . losartan (COZAAR) 100 MG tablet Take 1 tablet (100 mg total) by mouth daily. 90 tablet 3  . metFORMIN (GLUCOPHAGE) 500 MG tablet TAKE 1 TABLET (500 MG TOTAL) BY MOUTH 2 (TWO) TIMES DAILY WITH A MEAL. 180 tablet 1  . pravastatin (PRAVACHOL) 80 MG tablet TAKE 1 TABLET BY MOUTH EVERY DAY 90 tablet 3   No current facility-administered medications on file prior to visit.    No Known Allergies  Past Medical History:  Diagnosis Date  . Allergy   . BPH (benign prostatic hypertrophy)   . Diabetes mellitus   . Hyperlipidemia   .  Hypertension     Past Surgical History:  Procedure Laterality Date  . PROSTATE BIOPSY     negative  . TONSILLECTOMY AND ADENOIDECTOMY    . VENTRAL HERNIA REPAIR     periumbilical    Family History  Problem Relation Age of Onset  . Heart disease Mother   . Diabetes Father   . Heart disease Father   . Diabetes Brother   . Heart disease Brother     Social History   Socioeconomic History  . Marital status: Widowed    Spouse name: Not on file  . Number of children: 3  . Years of education: Not on file  . Highest education level: Not on file  Occupational History  . Occupation: retired Nurse, mental health  . Smoking status: Former Smoker    Packs/day: 2.00    Years: 20.00    Pack years: 40.00    Types: Cigarettes    Quit date: 08/16/1972    Years since quitting: 47.2  . Smokeless tobacco: Never Used  Substance and Sexual Activity  . Alcohol use: No    Comment: rare  . Drug use: No  . Sexual activity: Not on file  Other Topics Concern  . Not on file  Social History Narrative   Has living will.    Has health care POA---daughter then son   Would accept resuscitation--but no prolonged artificial ventilation   Would not want  feeding tube if cognitively unaware   Social Determinants of Health   Financial Resource Strain:   . Difficulty of Paying Living Expenses: Not on file  Food Insecurity:   . Worried About Charity fundraiser in the Last Year: Not on file  . Ran Out of Food in the Last Year: Not on file  Transportation Needs:   . Lack of Transportation (Medical): Not on file  . Lack of Transportation (Non-Medical): Not on file  Physical Activity:   . Days of Exercise per Week: Not on file  . Minutes of Exercise per Session: Not on file  Stress:   . Feeling of Stress : Not on file  Social Connections:   . Frequency of Communication with Friends and Family: Not on file  . Frequency of Social Gatherings with Friends and Family: Not on file  . Attends  Religious Services: Not on file  . Active Member of Clubs or Organizations: Not on file  . Attends Archivist Meetings: Not on file  . Marital Status: Not on file  Intimate Partner Violence:   . Fear of Current or Ex-Partner: Not on file  . Emotionally Abused: Not on file  . Physically Abused: Not on file  . Sexually Abused: Not on file   Review of Systems  Had some vertigo last night---meclizine did help Usually sleeps okay---nocturia x 1-2 Appetite is not like it used to be--weight down a bit     Objective:   Physical Exam  Constitutional: He appears well-developed. No distress.  Neck: No thyromegaly present.  Cardiovascular: Normal rate, regular rhythm, normal heart sounds and intact distal pulses. Exam reveals no gallop.  No murmur heard. Respiratory: Effort normal and breath sounds normal. No respiratory distress. He has no wheezes. He has no rales.  GI: Soft. There is no abdominal tenderness.  Musculoskeletal:     Comments: Almost no rotation of left hip. Severe antalgic hip gait on left  Lymphadenopathy:    He has no cervical adenopathy.  Skin:  No foot lesions  Psychiatric: He has a normal mood and affect. His behavior is normal.           Assessment & Plan:

## 2019-10-23 NOTE — Assessment & Plan Note (Signed)
BP Readings from Last 3 Encounters:  10/23/19 138/70  04/25/19 132/66  09/06/18 (!) 160/56   Good control No changes in amlodipine/losartan needed

## 2020-01-14 ENCOUNTER — Other Ambulatory Visit: Payer: Self-pay | Admitting: Internal Medicine

## 2020-01-21 DIAGNOSIS — L218 Other seborrheic dermatitis: Secondary | ICD-10-CM | POA: Diagnosis not present

## 2020-01-21 DIAGNOSIS — L570 Actinic keratosis: Secondary | ICD-10-CM | POA: Diagnosis not present

## 2020-01-21 DIAGNOSIS — L578 Other skin changes due to chronic exposure to nonionizing radiation: Secondary | ICD-10-CM | POA: Diagnosis not present

## 2020-01-21 DIAGNOSIS — D485 Neoplasm of uncertain behavior of skin: Secondary | ICD-10-CM | POA: Diagnosis not present

## 2020-01-21 DIAGNOSIS — C44519 Basal cell carcinoma of skin of other part of trunk: Secondary | ICD-10-CM | POA: Diagnosis not present

## 2020-01-21 DIAGNOSIS — Z872 Personal history of diseases of the skin and subcutaneous tissue: Secondary | ICD-10-CM | POA: Diagnosis not present

## 2020-02-13 DIAGNOSIS — C44519 Basal cell carcinoma of skin of other part of trunk: Secondary | ICD-10-CM | POA: Diagnosis not present

## 2020-03-21 DIAGNOSIS — M1612 Unilateral primary osteoarthritis, left hip: Secondary | ICD-10-CM | POA: Diagnosis not present

## 2020-03-26 ENCOUNTER — Other Ambulatory Visit: Payer: Self-pay | Admitting: Orthopaedic Surgery

## 2020-03-28 ENCOUNTER — Other Ambulatory Visit: Payer: Self-pay | Admitting: Orthopaedic Surgery

## 2020-03-31 ENCOUNTER — Telehealth: Payer: Self-pay | Admitting: Internal Medicine

## 2020-03-31 NOTE — Telephone Encounter (Signed)
[facility] called checking to make sure you received surgical clearance she faxed last week  Will fax another on in case your didn't receivedc

## 2020-03-31 NOTE — Telephone Encounter (Signed)
I have not received a request for surgical clearance

## 2020-04-01 NOTE — Progress Notes (Addendum)
DUE TO COVID-19 ONLY ONE VISITOR IS ALLOWED TO COME WITH YOU AND STAY IN THE WAITING ROOM ONLY DURING PRE OP AND PROCEDURE DAY OF SURGERY. THE 1 VISITOR  MAY VISIT WITH YOU AFTER SURGERY IN YOUR PRIVATE ROOM DURING VISITING HOURS ONLY!  YOU NEED TO HAVE A COVID 19 TEST ON___8/28/2021 ____ @_______ , THIS TEST MUST BE DONE BEFORE SURGERY,  COVID TESTING SITE [address] [place] [address], IT IS ON THE RIGHT GOING OUT [address] APPROXIMATELY  2 MINUTES PAST [employer] ON THE RIGHT. ONCE YOUR COVID TEST IS COMPLETED,  PLEASE BEGIN THE QUARANTINE INSTRUCTIONS AS OUTLINED IN YOUR HANDOUT.                John Sanford  04/01/2020   Your procedure is scheduled on:       04/15/2020   Report to [facility] Main  Entrance   Report to admitting at     0800 AM     Call this number if you have problems the morning of surgery [phone]    Remember: Do not eat food   :After Midnight. BRUSH YOUR TEETH MORNING OF SURGERY AND RINSE YOUR MOUTH OUT, NO CHEWING GUM CANDY OR MINTS.   NO SOLID FOOD AFTER MIDNIGHT THE NIGHT PRIOR TO SURGERY. NOTHING BY MOUTH EXCEPT CLEAR LIQUIDS UNTIL   0730am . PLEASE FINISH ENSURE DRINK PER SURGEON ORDER  WHICH NEEDS TO BE COMPLETED AT .0730am     CLEAR LIQUID DIET   Foods Allowed                                                                   Coffee and tea, regular and decaf                              Plain Jell-O any favor Fruit ices (not with fruit pulp)                                      Iced Popsicles                                   Carbonated beverages, regular and diet                                    Cranberry, grape and apple juices Sports drinks like Gatorade Lightly seasoned clear broth or consume(fat free) Sugar, honey syrup                                                                        _____________________________________________________________________    Take these medicines the  morning of surgery with A SIP OF WATER:    Amlodipine, Pravastatin  DO  NOT TAKE ANY DIABETIC MEDICATIONS DAY OF YOUR SURGERY                               You may not have any metal on your body including hair pins and              piercings  Do not wear jewelry, make-up, lotions, powders or perfumes, deodorant             Do not wear nail polish on your fingernails.  Do not shave  48 hours prior to surgery.              Men may shave face and neck.   Do not bring valuables to the hospital. [place].  Contacts, dentures or bridgework may not be worn into surgery.  Leave suitcase in the car. After surgery it may be brought to your room.     Patients discharged the day of surgery will not be allowed to drive home. IF YOU ARE HAVING SURGERY AND GOING HOME THE SAME DAY, YOU MUST HAVE AN ADULT TO DRIVE YOU HOME AND BE WITH YOU FOR 24 HOURS. YOU MAY GO HOME BY TAXI OR UBER OR ORTHERWISE, BUT AN ADULT MUST ACCOMPANY YOU HOME AND STAY WITH YOU FOR 24 HOURS.  Name and phone number of your driver:              Please read over the following fact sheets you were given: _____________________________________________________________________  [facility] - Preparing for Surgery Before surgery, you can play an important role.  Because skin is not sterile, your skin needs to be as free of germs as possible.  You can reduce the number of germs on your skin by washing with CHG (chlorahexidine gluconate) soap before surgery.  CHG is an antiseptic cleaner which kills germs and bonds with the skin to continue killing germs even after washing. Please DO NOT use if you have an allergy to CHG or antibacterial soaps.  If your skin becomes reddened/irritated stop using the CHG and inform your nurse when you arrive at Short Stay. Do not shave (including legs and underarms) for at least 48 hours prior to the first CHG shower.  You may shave your face/neck. Please follow  these instructions carefully:  1.  Shower with CHG Soap the night before surgery and the  morning of Surgery.  2.  If you choose to wash your hair, wash your hair first as usual with your  normal  shampoo.  3.  After you shampoo, rinse your hair and body thoroughly to remove the  shampoo.                           4.  Use CHG as you would any other liquid soap.  You can apply chg directly  to the skin and wash                       Gently with a scrungie or clean washcloth.  5.  Apply the CHG Soap to your body ONLY FROM THE NECK DOWN.   Do not use on face/ open  Wound or open sores. Avoid contact with eyes, ears mouth and genitals (private parts).                       Wash face,  Genitals (private parts) with your normal soap.             6.  Wash thoroughly, paying special attention to the area where your surgery  will be performed.  7.  Thoroughly rinse your body with warm water from the neck down.  8.  DO NOT shower/wash with your normal soap after using and rinsing off  the CHG Soap.                9.  Pat yourself dry with a clean towel.            10.  Wear clean pajamas.            11.  Place clean sheets on your bed the night of your first shower and do not  sleep with pets. Day of Surgery : Do not apply any lotions/deodorants the morning of surgery.  Please wear clean clothes to the hospital/surgery center.  FAILURE TO FOLLOW THESE INSTRUCTIONS MAY RESULT IN THE CANCELLATION OF YOUR SURGERY PATIENT SIGNATURE_________________________________  NURSE SIGNATURE__________________________________  ________________________________________________________________________   John Sanford  An incentive spirometer is a tool that can help keep your lungs clear and active. This tool measures how well you are filling your lungs with each breath. Taking long deep breaths may help reverse or decrease the chance of developing breathing (pulmonary) problems  (especially infection) following:  A long period of time when you are unable to move or be active. BEFORE THE PROCEDURE   If the spirometer includes an indicator to show your best effort, your nurse or respiratory therapist will set it to a desired goal.  If possible, sit up straight or lean slightly forward. Try not to slouch.  Hold the incentive spirometer in an upright position. INSTRUCTIONS FOR USE  1. Sit on the edge of your bed if possible, or sit up as far as you can in bed or on a chair. 2. Hold the incentive spirometer in an upright position. 3. Breathe out normally. 4. Place the mouthpiece in your mouth and seal your lips tightly around it. 5. Breathe in slowly and as deeply as possible, raising the piston or the ball toward the top of the column. 6. Hold your breath for 3-5 seconds or for as long as possible. Allow the piston or ball to fall to the bottom of the column. 7. Remove the mouthpiece from your mouth and breathe out normally. 8. Rest for a few seconds and repeat Steps 1 through 7 at least 10 times every 1-2 hours when you are awake. Take your time and take a few normal breaths between deep breaths. 9. The spirometer may include an indicator to show your best effort. Use the indicator as a goal to work toward during each repetition. 10. After each set of 10 deep breaths, practice coughing to be sure your lungs are clear. If you have an incision (the cut made at the time of surgery), support your incision when coughing by placing a pillow or rolled up towels firmly against it. Once you are able to get out of bed, walk around indoors and cough well. You may stop using the incentive spirometer when instructed by your caregiver.  RISKS AND COMPLICATIONS  Take your time so you do not get  dizzy or light-headed.  If you are in pain, you may need to take or ask for pain medication before doing incentive spirometry. It is harder to take a deep breath if you are having  pain. AFTER USE  Rest and breathe slowly and easily.  It can be helpful to keep track of a log of your progress. Your caregiver can provide you with a simple table to help with this. If you are using the spirometer at home, follow these instructions: [place] IF:   You are having difficultly using the spirometer.  You have trouble using the spirometer as often as instructed.  Your pain medication is not giving enough relief while using the spirometer.  You develop fever of 100.5 F (38.1 C) or higher. SEEK IMMEDIATE MEDICAL CARE IF:   You cough up bloody sputum that had not been present before.  You develop fever of 102 F (38.9 C) or greater.  You develop worsening pain at or near the incision site. MAKE SURE YOU:   Understand these instructions.  Will watch your condition.  Will get help right away if you are not doing well or get worse. Document Released: 12/13/2006 Document Revised: 10/25/2011 Document Reviewed: 02/13/2007 [facility] Patient Information 2014 [place], [place].   ________________________________________________________________________

## 2020-04-03 ENCOUNTER — Other Ambulatory Visit: Payer: Self-pay

## 2020-04-03 ENCOUNTER — Encounter (HOSPITAL_COMMUNITY)
Admission: RE | Admit: 2020-04-03 | Discharge: 2020-04-03 | Disposition: A | Discharge status: Home / Self Care | Payer: Medicare HMO | Source: Ambulatory Visit | Attending: Orthopaedic Surgery | Admitting: Orthopaedic Surgery

## 2020-04-03 ENCOUNTER — Ambulatory Visit (HOSPITAL_COMMUNITY)
Admission: RE | Admit: 2020-04-03 | Discharge: 2020-04-03 | Disposition: A | Discharge status: Home / Self Care | Payer: Medicare HMO | Source: Ambulatory Visit | Attending: Orthopaedic Surgery | Admitting: Orthopaedic Surgery

## 2020-04-03 ENCOUNTER — Encounter (HOSPITAL_COMMUNITY): Payer: Self-pay

## 2020-04-03 DIAGNOSIS — J439 Emphysema, unspecified: Secondary | ICD-10-CM | POA: Diagnosis not present

## 2020-04-03 DIAGNOSIS — Z01818 Encounter for other preprocedural examination: Secondary | ICD-10-CM | POA: Insufficient documentation

## 2020-04-03 DIAGNOSIS — I700 Atherosclerosis of aorta: Secondary | ICD-10-CM | POA: Diagnosis not present

## 2020-04-03 HISTORY — DX: Malignant (primary) neoplasm, unspecified: C80.1

## 2020-04-03 HISTORY — DX: Pneumonia, unspecified organism: J18.9

## 2020-04-03 HISTORY — DX: Unspecified osteoarthritis, unspecified site: M19.90

## 2020-04-03 LAB — CBC WITH DIFFERENTIAL/PLATELET
Abs Immature Granulocytes: 0.01 10*3/uL (ref 0.00–0.07)
Basophils Absolute: 0 10*3/uL (ref 0.0–0.1)
Basophils Relative: 1 %
Eosinophils Absolute: 0.3 10*3/uL (ref 0.0–0.5)
Eosinophils Relative: 4 %
HCT: 41.6 % (ref 39.0–52.0)
Hemoglobin: 13.8 g/dL (ref 13.0–17.0)
Immature Granulocytes: 0 %
Lymphocytes Relative: 28 %
Lymphs Abs: 2.2 10*3/uL (ref 0.7–4.0)
MCH: 31.2 pg (ref 26.0–34.0)
MCHC: 33.2 g/dL (ref 30.0–36.0)
MCV: 93.9 fL (ref 80.0–100.0)
Monocytes Absolute: 0.7 10*3/uL (ref 0.1–1.0)
Monocytes Relative: 9 %
Neutro Abs: 4.6 10*3/uL (ref 1.7–7.7)
Neutrophils Relative %: 58 %
Platelets: 215 10*3/uL (ref 150–400)
RBC: 4.43 MIL/uL (ref 4.22–5.81)
RDW: 12.8 % (ref 11.5–15.5)
WBC: 7.8 10*3/uL (ref 4.0–10.5)
nRBC: 0 % (ref 0.0–0.2)

## 2020-04-03 LAB — URINALYSIS, ROUTINE W REFLEX MICROSCOPIC
Bilirubin Urine: NEGATIVE
Glucose, UA: 50 mg/dL — AB
Hgb urine dipstick: NEGATIVE
Ketones, ur: NEGATIVE mg/dL
Leukocytes,Ua: NEGATIVE
Nitrite: NEGATIVE
Protein, ur: NEGATIVE mg/dL
Specific Gravity, Urine: 1.016 (ref 1.005–1.030)
pH: 5 (ref 5.0–8.0)

## 2020-04-03 LAB — BASIC METABOLIC PANEL
Anion gap: 11 (ref 5–15)
BUN: 20 mg/dL (ref 8–23)
CO2: 26 mmol/L (ref 22–32)
Calcium: 9.7 mg/dL (ref 8.9–10.3)
Chloride: 101 mmol/L (ref 98–111)
Creatinine, Ser: 1.07 mg/dL (ref 0.61–1.24)
GFR calc Af Amer: >60 mL/min (ref >60)
GFR calc non Af Amer: >60 mL/min (ref >60)
Glucose, Bld: 175 mg/dL — ABNORMAL HIGH (ref 70–99)
Potassium: 5.1 mmol/L (ref 3.5–5.1)
Sodium: 138 mmol/L (ref 135–145)

## 2020-04-03 LAB — SURGICAL PCR SCREEN
MRSA, PCR: NEGATIVE
Staphylococcus aureus: NEGATIVE

## 2020-04-03 LAB — TYPE AND SCREEN
ABO/RH(D): O NEG
Antibody Screen: NEGATIVE

## 2020-04-03 LAB — PROTIME-INR
INR: 1 (ref 0.8–1.2)
Prothrombin Time: 12.4 seconds (ref 11.4–15.2)

## 2020-04-03 LAB — HEMOGLOBIN A1C
Hgb A1c MFr Bld: 9 % — ABNORMAL HIGH (ref 4.8–5.6)
Mean Plasma Glucose: 211.6 mg/dL

## 2020-04-03 LAB — APTT: aPTT: 26 seconds (ref 24–36)

## 2020-04-03 NOTE — Progress Notes (Signed)
U/A roiuted via epic to DR [facility].

## 2020-04-04 NOTE — Progress Notes (Addendum)
Anesthesia Review:  PCP:  Silvio Pate Cardiologist : Chest x-ray :04/03/2020  EKG :04/03/20-epic  Echo : Stress test: Cardiac Cath :  Activity level: can do a flight of stairs without difficulty  Sleep Study/ CPAP :no  Fasting Blood Sugar :      / Checks Blood Sugar -- times a day:   Blood Thinner/ Instructions /Last Dose: ASA / Instructions/ Last Dose :  HGBA1C at preop on 04/03/20- 9.0 routed to DR [facility].

## 2020-04-14 ENCOUNTER — Telehealth: Payer: Self-pay | Admitting: Internal Medicine

## 2020-04-14 DIAGNOSIS — E0865 Diabetes mellitus due to underlying condition with hyperglycemia: Secondary | ICD-10-CM

## 2020-04-14 NOTE — Telephone Encounter (Signed)
Patient came in office today stating that he had a hip replacement scheduled but when he went to the prep appointment they told him they couldn't do the surgery because his sugar was too high and that he would need to call and see if he can get a  Referral to a enncologist to get it regulated. He would like to receive a referral  To Dr.D Connel and [facility] in [place]. Please reach out to patient regarding matter.

## 2020-04-15 ENCOUNTER — Encounter (HOSPITAL_COMMUNITY): Admission: RE | Payer: Self-pay | Source: Ambulatory Visit

## 2020-04-15 ENCOUNTER — Ambulatory Visit (HOSPITAL_COMMUNITY): Admission: RE | Admit: 2020-04-15 | Payer: Medicare HMO | Source: Ambulatory Visit | Admitting: Orthopaedic Surgery

## 2020-04-15 SURGERY — ARTHROPLASTY, HIP, TOTAL, ANTERIOR APPROACH
Anesthesia: Spinal | Site: Hip | Laterality: Left

## 2020-04-15 NOTE — Telephone Encounter (Signed)
General threshold is 7.5% I would have argued to proceed if he was still at 8% like last time--but he went up all the way to 9%. Referral to endocrine made

## 2020-04-16 ENCOUNTER — Other Ambulatory Visit: Payer: Self-pay | Admitting: Internal Medicine

## 2020-04-22 ENCOUNTER — Telehealth: Payer: Self-pay

## 2020-04-22 NOTE — Telephone Encounter (Signed)
That is okay with me--can reschedule the upcoming appt

## 2020-04-22 NOTE — Telephone Encounter (Signed)
Patient came into office wanting to know if he could do a provider switch. He is aware he has an appointment on the 13 of this month and is wanting to see Dr. Darnell Level instead of Dr. Silvio Pate     Please advise if this TOC is ok

## 2020-04-23 NOTE — Telephone Encounter (Signed)
Ok to transfer care. May place in an open slot.

## 2020-04-28 ENCOUNTER — Encounter: Payer: Medicare HMO | Admitting: Internal Medicine

## 2020-04-28 DIAGNOSIS — H524 Presbyopia: Secondary | ICD-10-CM | POA: Diagnosis not present

## 2020-04-28 DIAGNOSIS — H11159 Pinguecula, unspecified eye: Secondary | ICD-10-CM | POA: Diagnosis not present

## 2020-04-28 DIAGNOSIS — Z7984 Long term (current) use of oral hypoglycemic drugs: Secondary | ICD-10-CM | POA: Diagnosis not present

## 2020-04-28 DIAGNOSIS — I10 Essential (primary) hypertension: Secondary | ICD-10-CM | POA: Diagnosis not present

## 2020-04-28 DIAGNOSIS — Z961 Presence of intraocular lens: Secondary | ICD-10-CM | POA: Diagnosis not present

## 2020-04-28 DIAGNOSIS — E119 Type 2 diabetes mellitus without complications: Secondary | ICD-10-CM | POA: Diagnosis not present

## 2020-04-28 LAB — HM DIABETES EYE EXAM

## 2020-05-21 ENCOUNTER — Other Ambulatory Visit: Payer: Self-pay

## 2020-05-21 ENCOUNTER — Encounter: Payer: Self-pay | Admitting: Family Medicine

## 2020-05-21 ENCOUNTER — Telehealth: Payer: Self-pay | Admitting: Family Medicine

## 2020-05-21 ENCOUNTER — Ambulatory Visit (INDEPENDENT_AMBULATORY_CARE_PROVIDER_SITE_OTHER): Payer: Medicare HMO | Admitting: Family Medicine

## 2020-05-21 VITALS — BP 150/62 | HR 76 | Temp 97.9°F | Ht 67.0 in | Wt 173.3 lb

## 2020-05-21 DIAGNOSIS — R69 Illness, unspecified: Secondary | ICD-10-CM | POA: Diagnosis not present

## 2020-05-21 DIAGNOSIS — Z23 Encounter for immunization: Secondary | ICD-10-CM

## 2020-05-21 DIAGNOSIS — E1149 Type 2 diabetes mellitus with other diabetic neurological complication: Secondary | ICD-10-CM

## 2020-05-21 DIAGNOSIS — E785 Hyperlipidemia, unspecified: Secondary | ICD-10-CM

## 2020-05-21 DIAGNOSIS — E1169 Type 2 diabetes mellitus with other specified complication: Secondary | ICD-10-CM

## 2020-05-21 DIAGNOSIS — I10 Essential (primary) hypertension: Secondary | ICD-10-CM | POA: Diagnosis not present

## 2020-05-21 DIAGNOSIS — M1612 Unilateral primary osteoarthritis, left hip: Secondary | ICD-10-CM | POA: Diagnosis not present

## 2020-05-21 MED ORDER — METFORMIN HCL 500 MG PO TABS
ORAL_TABLET | ORAL | 1 refills | Status: DC
Start: 2020-05-21 — End: 2020-07-02

## 2020-05-21 MED ORDER — GLUCOSE BLOOD VI STRP: ORAL_STRIP | 3 refills | Status: DC

## 2020-05-21 MED ORDER — ONETOUCH ULTRA 2 W/DEVICE KIT: PACK | 0 refills | Status: DC

## 2020-05-21 MED ORDER — ONETOUCH DELICA PLUS LANCET33G MISC: 3 refills | Status: DC

## 2020-05-21 NOTE — Telephone Encounter (Signed)
Received rx failure message for OneTouch Ultra 2 meter.  Spoke with [facility].  Says issue was resolved and we can disregard the message.

## 2020-05-21 NOTE — Progress Notes (Signed)
This visit was conducted in person.  BP (!) 150/62 (BP Location: Right Arm, Patient Position: Sitting, Cuff Size: Normal)    Pulse 76    Temp 97.9 F (36.6 C) (Temporal)    Ht 5\' 7"  (1.702 m)    Wt 173 lb 5 oz (78.6 kg)    SpO2 97%    BMI 27.14 kg/m    On repeat check 150/60 CC: transfer of care, discuss diabetes  Subjective:    Patient ID: John Sanford, male    DOB: [birth year], 86 y.o.   MRN: [id number]  HPI: John Sanford is a 86 y.o. male presenting on 05/21/2020 for Transitions Of Care (Wants to discuss increasing BS med.  Has endo appt 05/26/20. )   Transfer of care from Dr Silvio Pate.  Hip replacement (Dr Rhona Raider) was postponed due to A1c at 9%. A1c needs to be <7.5%.  Son stays with him at night time otherwise lives alone.   Noticing increasing nocturia and leg swelling, he feels increasing metformin has helped him.   DM - does not regularly check sugars. Scheduled upcoming [place] endo appt next week per ortho recs. Compliant with antihyperglycemic regimen which includes: metformin 500mg  bid. He has increased metformin to 1000mg  bid for a few days and tolerated well. Currently back to taking metformin 500mg  bid. Denies hypoglycemic symptoms. Mild neuropathy on gabapentin 300mg  at bedtime with good effect. Last diabetic eye exam - 04/2020 we will request records. Pneumovax: 2011, 2019. Prevnar: 2015. Glucometer brand: does not have. DSME: will refer. Lab Results  Component Value Date   HGBA1C 9.0 (H) 04/03/2020   Diabetic Foot Exam - Simple   Simple Foot Form Diabetic Foot exam was performed with the following findings: Yes 05/21/2020  9:59 AM  Visual Inspection See comments: Yes Sensation Testing See comments: Yes Pulse Check Posterior Tibialis and Dorsalis pulse intact bilaterally: Yes Comments Slightly diminished to monofilament testing Maceration/break down between 4th/5th digits on left    Lab Results  Component Value Date   MICROALBUR 1.2 02/01/2012     HTN - compliant with losartan 100mg  daily, amlodipine 5mg  daily. Does not check BP at home. No HA, vision changes, CP/tightness, SOB. Ankle swelling present - longstanding.      Relevant past medical, surgical, family and social history reviewed and updated as indicated. Interim medical history since our last visit reviewed. Allergies and medications reviewed and updated. Outpatient Medications Prior to Visit  Medication Sig Dispense Refill   aspirin 81 MG tablet Take 81 mg by mouth daily.       docusate sodium (COLACE) 100 MG capsule Take 100 mg by mouth daily. OTC     gabapentin (NEURONTIN) 300 MG capsule TAKE 1-2 CAPSULES BY MOUTH AT BEDTIME (Patient taking differently: Take 300 mg by mouth at bedtime. ) 60 capsule 5   losartan (COZAAR) 100 MG tablet TAKE 1 TABLET BY MOUTH EVERY DAY 90 tablet 3   pravastatin (PRAVACHOL) 80 MG tablet TAKE 1 TABLET BY MOUTH EVERY DAY (Patient taking differently: Take 40 mg by mouth daily. ) 90 tablet 3   amLODipine (NORVASC) 5 MG tablet TAKE 1 TABLET BY MOUTH DAILY. NEED OFFICE OR VIRTUAL VISIT. (Patient taking differently: Take 5 mg by mouth daily. ) 90 tablet 3   metFORMIN (GLUCOPHAGE) 500 MG tablet TAKE ONE TABLET BY MOUTH TWICE A DAY WITH MEAL (Patient taking differently: Take 500 mg by mouth 2 (two) times daily with a meal. ) 180 tablet 3   No facility-administered medications  prior to visit.     Per HPI unless specifically indicated in ROS section below Review of Systems Objective:  BP (!) 150/62 (BP Location: Right Arm, Patient Position: Sitting, Cuff Size: Normal)    Pulse 76    Temp 97.9 F (36.6 C) (Temporal)    Ht 5\' 7"  (1.702 m)    Wt 173 lb 5 oz (78.6 kg)    SpO2 97%    BMI 27.14 kg/m   Wt Readings from Last 3 Encounters:  05/21/20 173 lb 5 oz (78.6 kg)  10/23/19 173 lb (78.5 kg)  04/25/19 176 lb (79.8 kg)      Physical Exam Vitals and nursing note reviewed.  Constitutional:      General: He is not in acute distress.     Appearance: Normal appearance. He is well-developed. He is not ill-appearing.  Eyes:     General: No scleral icterus.    Extraocular Movements: Extraocular movements intact.     Conjunctiva/sclera: Conjunctivae normal.     Pupils: Pupils are equal, round, and reactive to light.  Cardiovascular:     Rate and Rhythm: Normal rate and regular rhythm.     Pulses: Normal pulses.     Heart sounds: Normal heart sounds. No murmur heard.   Pulmonary:     Effort: Pulmonary effort is normal. No respiratory distress.     Breath sounds: Normal breath sounds. No wheezing, rhonchi or rales.  Musculoskeletal:     Cervical back: Normal range of motion and neck supple.     Right lower leg: Edema (1+ to ankles) present.     Left lower leg: Edema (1+ to ankles) present.     Comments: See HPI for foot exam if done  Lymphadenopathy:     Cervical: No cervical adenopathy.  Skin:    General: Skin is warm and dry.     Findings: No rash.  Neurological:     Mental Status: He is alert.  Psychiatric:        Mood and Affect: Mood normal.        Behavior: Behavior normal.       Results for orders placed or performed during the hospital encounter of 04/03/20  Surgical pcr screen   Specimen: Nasal Mucosa; Nasal Swab  Result Value Ref Range   MRSA, PCR NEGATIVE NEGATIVE   Staphylococcus aureus NEGATIVE NEGATIVE  APTT  Result Value Ref Range   aPTT 26 24 - 36 seconds  Basic metabolic panel  Result Value Ref Range   Sodium 138 135 - 145 mmol/L   Potassium 5.1 3.5 - 5.1 mmol/L   Chloride 101 98 - 111 mmol/L   CO2 26 22 - 32 mmol/L   Glucose, Bld 175 (H) 70 - 99 mg/dL   BUN 20 8 - 23 mg/dL   Creatinine, Ser 1.07 0.61 - 1.24 mg/dL   Calcium 9.7 8.9 - 10.3 mg/dL   GFR calc non Af Amer >60 >60 mL/min   GFR calc Af Amer >60 >60 mL/min   Anion gap 11 5 - 15  CBC WITH DIFFERENTIAL  Result Value Ref Range   WBC 7.8 4.0 - 10.5 K/uL   RBC 4.43 4.22 - 5.81 MIL/uL   Hemoglobin 13.8 13.0 - 17.0 g/dL   HCT  41.6 39 - 52 %   MCV 93.9 80.0 - 100.0 fL   MCH 31.2 26.0 - 34.0 pg   MCHC 33.2 30.0 - 36.0 g/dL   RDW 12.8 11.5 - 15.5 %   Platelets  215 150 - 400 K/uL   nRBC 0.0 0.0 - 0.2 %   Neutrophils Relative % 58 %   Neutro Abs 4.6 1.7 - 7.7 K/uL   Lymphocytes Relative 28 %   Lymphs Abs 2.2 0.7 - 4.0 K/uL   Monocytes Relative 9 %   Monocytes Absolute 0.7 0 - 1 K/uL   Eosinophils Relative 4 %   Eosinophils Absolute 0.3 0 - 0 K/uL   Basophils Relative 1 %   Basophils Absolute 0.0 0 - 0 K/uL   Immature Granulocytes 0 %   Abs Immature Granulocytes 0.01 0.00 - 0.07 K/uL  Protime-INR  Result Value Ref Range   Prothrombin Time 12.4 11.4 - 15.2 seconds   INR 1.0 0.8 - 1.2  Urinalysis, Routine w reflex microscopic Urine, Clean Catch  Result Value Ref Range   Color, Urine YELLOW YELLOW   APPearance CLEAR CLEAR   Specific Gravity, Urine 1.016 1.005 - 1.030   pH 5.0 5.0 - 8.0   Glucose, UA 50 (A) NEGATIVE mg/dL   Hgb urine dipstick NEGATIVE NEGATIVE   Bilirubin Urine NEGATIVE NEGATIVE   Ketones, ur NEGATIVE NEGATIVE mg/dL   Protein, ur NEGATIVE NEGATIVE mg/dL   Nitrite NEGATIVE NEGATIVE   Leukocytes,Ua NEGATIVE NEGATIVE  Hemoglobin A1c  Result Value Ref Range   Hgb A1c MFr Bld 9.0 (H) 4.8 - 5.6 %   Mean Plasma Glucose 211.6 mg/dL  Type and screen Order type and screen if day of surgery is less than 15 days from draw of preadmission visit or order morning of surgery if day of surgery is greater than 6 days from preadmission visit.  Result Value Ref Range   ABO/RH(D) O NEG    Antibody Screen NEG    Sample Expiration 04/17/2020,2359    Extend sample reason      NO TRANSFUSIONS OR PREGNANCY IN THE PAST 3 MONTHS Performed at [facility], [place] [address] [place], [place] [address]    Assessment & Plan:  This visit occurred during the SARS-CoV-2 public health emergency.  Safety protocols were in place, including screening questions prior to the visit, additional usage of  staff PPE, and extensive cleaning of exam room while observing appropriate contact time as indicated for disinfecting solutions.   Problem List Items Addressed This Visit    Type 2 diabetes mellitus with neurological manifestations, controlled ([place]) - Primary    Chronic, deteriorated.  Reviewed natural course of diabetes balanced with need to avoid hypoglycemia in elderly. Will refer to diabetes education classes.  Has tolerated higher metformin doses. Will increase to metformin 500/1000mg  daily. He would be open to weekly GLP1 RA. Reasonable to keep endo appt next week for evaluation.  RTC 6wks DM f/u visit - when next due for A1c (unless endo checks sooner)      Relevant Medications   metFORMIN (GLUCOPHAGE) 500 MG tablet   Other Relevant Orders   Ambulatory referral to diabetic education   Osteoarthritis of left hip    Ongoing physical limitations due to pain. Needs A1c <7.5% per ortho recs. Given age I would also be fine with A1c <8%, however would defer to ortho requirement. Will continue working towards this.       Hyperlipidemia associated with type 2 diabetes mellitus ([place])    Continue high dose pravastatin. The ASCVD Risk score Mikey Bussing DC Jr., et al., 2013) failed to calculate for the following reasons:   The 2013 ASCVD risk score is only valid for ages 91 to 24  Relevant Medications   metFORMIN (GLUCOPHAGE) 500 MG tablet   Essential hypertension, benign    BP mildly elevated - amlodipine titration limited by pedal edema. No med changes today, will continue to watch.        Other Visit Diagnoses    Need for influenza vaccination       Relevant Orders   Flu Vaccine QUAD High Dose(Fluad) (Completed)       Meds ordered this encounter  Medications   metFORMIN (GLUCOPHAGE) 500 MG tablet    Sig: Take 1 tablet (500 mg total) by mouth daily with breakfast AND 2 tablets (1,000 mg total) at bedtime.    Dispense:  270 tablet    Refill:  1   Orders Placed This Encounter    Procedures   Flu Vaccine QUAD High Dose(Fluad)   Ambulatory referral to diabetic education    Referral Priority:   Routine    Referral Type:   Consultation    Referral Reason:   Specialty Services Required    Number of Visits Requested:   1    Patient instructions: We will request [facility] from [facility].  Check with insurance on preferred glucose meter brand and let me know - I will send new meter for you. Bring into office for nurse visit to review how to check sugars. Goal will be fasting 80-120, if checked 2 hour after meal goal is <180.  Increase metformin to 500mg  in am and 1000mg  in pm.  I will refer you to diabetes education classes.  Return in 6 weeks for diabetes follow up visit.   Follow up plan: Return in about 6 weeks (around 07/02/2020) for follow up visit.  Ria Bush, MD

## 2020-05-21 NOTE — Patient Instructions (Addendum)
We will request eye exam from [facility].  Check with insurance on preferred glucose meter brand and let me know - I will send new meter for you. Bring into office for nurse visit to review how to check sugars. Goal will be fasting 80-120, if checked 2 hour after meal goal is <180.  Increase metformin to 500mg  in am and 1000mg  in pm.  I will refer you to diabetes education classes.  Return in 6 weeks for diabetes follow up visit.   Diabetes Mellitus and Nutrition, Adult When you have diabetes (diabetes mellitus), it is very important to have healthy eating habits because your blood sugar (glucose) levels are greatly affected by what you eat and drink. Eating healthy foods in the appropriate amounts, at about the same times every day, can help you:  Control your blood glucose.  Lower your risk of heart disease.  Improve your blood pressure.  Reach or maintain a healthy weight. Every person with diabetes is different, and each person has different needs for a meal plan. Your health care provider may recommend that you work with a diet and nutrition specialist (dietitian) to make a meal plan that is best for you. Your meal plan may vary depending on factors such as:  The calories you need.  The medicines you take.  Your weight.  Your blood glucose, blood pressure, and cholesterol levels.  Your activity level.  Other health conditions you have, such as heart or kidney disease. How do carbohydrates affect me? Carbohydrates, also called carbs, affect your blood glucose level more than any other type of food. Eating carbs naturally raises the amount of glucose in your blood. Carb counting is a method for keeping track of how many carbs you eat. Counting carbs is important to keep your blood glucose at a healthy level, especially if you use insulin or take certain oral diabetes medicines. It is important to know how many carbs you can safely have in each meal. This is different for  every person. Your dietitian can help you calculate how many carbs you should have at each meal and for each snack. Foods that contain carbs include:  Bread, cereal, rice, pasta, and crackers.  Potatoes and corn.  Peas, beans, and lentils.  Milk and yogurt.  Fruit and juice.  Desserts, such as cakes, cookies, ice cream, and candy. How does alcohol affect me? Alcohol can cause a sudden decrease in blood glucose (hypoglycemia), especially if you use insulin or take certain oral diabetes medicines. Hypoglycemia can be a life-threatening condition. Symptoms of hypoglycemia (sleepiness, dizziness, and confusion) are similar to symptoms of having too much alcohol. If your health care provider says that alcohol is safe for you, follow these guidelines:  Limit alcohol intake to no more than 1 drink per day for nonpregnant women and 2 drinks per day for men. One drink equals 12 oz of beer, 5 oz of wine, or 1 oz of hard liquor.  Do not drink on an empty stomach.  Keep yourself hydrated with water, diet soda, or unsweetened iced tea.  Keep in mind that regular soda, juice, and other mixers may contain a lot of sugar and must be counted as carbs. What are tips for following this plan?  Reading food labels  Start by checking the serving size on the "Nutrition Facts" label of packaged foods and drinks. The amount of calories, carbs, fats, and other nutrients listed on the label is based on one serving of the item. Many items contain  more than one serving per package.  Check the total grams (g) of carbs in one serving. You can calculate the number of servings of carbs in one serving by dividing the total carbs by 15. For example, if a food has 30 g of total carbs, it would be equal to 2 servings of carbs.  Check the number of grams (g) of saturated and trans fats in one serving. Choose foods that have low or no amount of these fats.  Check the number of milligrams (mg) of salt (sodium) in one  serving. Most people should limit total sodium intake to less than 2,300 mg per day.  Always check the nutrition information of foods labeled as "low-fat" or "nonfat". These foods may be higher in added sugar or refined carbs and should be avoided.  Talk to your dietitian to identify your daily goals for nutrients listed on the label. Shopping  Avoid buying canned, premade, or processed foods. These foods tend to be high in fat, sodium, and added sugar.  Shop around the outside edge of the grocery store. This includes fresh fruits and vegetables, bulk grains, fresh meats, and fresh dairy. Cooking  Use low-heat cooking methods, such as baking, instead of high-heat cooking methods like deep frying.  Cook using healthy oils, such as olive, canola, or sunflower oil.  Avoid cooking with butter, cream, or high-fat meats. Meal planning  Eat meals and snacks regularly, preferably at the same times every day. Avoid going long periods of time without eating.  Eat foods high in fiber, such as fresh fruits, vegetables, beans, and whole grains. Talk to your dietitian about how many servings of carbs you can eat at each meal.  Eat 4-6 ounces (oz) of lean protein each day, such as lean meat, chicken, fish, eggs, or tofu. One oz of lean protein is equal to: ? 1 oz of meat, chicken, or fish. ? 1 egg. ?  cup of tofu.  Eat some foods each day that contain healthy fats, such as avocado, nuts, seeds, and fish. Lifestyle  Check your blood glucose regularly.  Exercise regularly as told by your health care provider. This may include: ? 150 minutes of moderate-intensity or vigorous-intensity exercise each week. This could be brisk walking, biking, or water aerobics. ? Stretching and doing strength exercises, such as yoga or weightlifting, at least 2 times a week.  Take medicines as told by your health care provider.  Do not use any products that contain nicotine or tobacco, such as cigarettes and  e-cigarettes. If you need help quitting, ask your health care provider.  Work with a Social worker or diabetes educator to identify strategies to manage stress and any emotional and social challenges. Questions to ask a health care provider  Do I need to meet with a diabetes educator?  Do I need to meet with a dietitian?  What number can I call if I have questions?  When are the best times to check my blood glucose? Where to find more information:  American Diabetes Association: diabetes.org  Academy of Nutrition and Dietetics: www.eatright.[employer] of Diabetes and Digestive and Kidney Diseases (NIH): [employer] Summary  A healthy meal plan will help you control your blood glucose and maintain a healthy lifestyle.  Working with a diet and nutrition specialist (dietitian) can help you make a meal plan that is best for you.  Keep in mind that carbohydrates (carbs) and alcohol have immediate effects on your blood glucose levels. It is important to count  carbs and to use alcohol carefully. This information is not intended to replace advice given to you by your health care provider. Make sure you discuss any questions you have with your health care provider. Document Revised: 07/15/2017 Document Reviewed: 09/06/2016 Elsevier Patient Education  2020 [employer].

## 2020-05-21 NOTE — Telephone Encounter (Signed)
One touch/Ultra 2 Glucose meter/  One touch Ultra Blue test strips/ One touch Delia Plus 33g lancets   As requested by Dr. Darnell Level.Marland KitchenMarland KitchenAbove is the exact meter and items that his insurance will approve. Would like Dr. Darnell Level to send the prescription so he can pick-up tonight.

## 2020-05-21 NOTE — Addendum Note (Signed)
Addended by: Brenton Grills on: 70/02/6150 83:43 PM   Modules accepted: Orders

## 2020-05-21 NOTE — Telephone Encounter (Addendum)
E-scribed rxs for meter and supplies to [facility].  Per John Sanford, pt also requested [place] Disability Parking Placard.  He requests call when form is ready.    Placed form in Dr. Synthia Innocent box.

## 2020-05-21 NOTE — Telephone Encounter (Signed)
Received request to fill out DMV application for handicap placard.  We did not discuss this at appt. Plz review with patient what criteria he meets to qualify. Form in Lisa's box.

## 2020-05-21 NOTE — Addendum Note (Signed)
Addended by: Brenton Grills on: 87/09/7616 48:59 PM   Modules accepted: Orders

## 2020-05-22 ENCOUNTER — Other Ambulatory Visit: Payer: Self-pay | Admitting: Internal Medicine

## 2020-05-22 NOTE — Telephone Encounter (Signed)
Attempted to contact pt.  No answer.  Vm box is full.  I need to speak with pt about parking placard request.

## 2020-05-22 NOTE — Assessment & Plan Note (Addendum)
Ongoing physical limitations due to pain. Needs A1c <7.5% per ortho recs. Given age I would also be fine with A1c <8%, however would defer to ortho requirement. Will continue working towards this.

## 2020-05-22 NOTE — Assessment & Plan Note (Signed)
Continue high dose pravastatin. The ASCVD Risk score Mikey Bussing DC Jr., et al., 2013) failed to calculate for the following reasons:   The 2013 ASCVD risk score is only valid for ages 39 to 39

## 2020-05-22 NOTE — Telephone Encounter (Signed)
Pt retuning call.  I reviewed criteria to qualify for parking placard.  Pt states due to him not having his hip replacement surgery yet, he does have to stop sometimes to rest when walking.  Says he has a cane and walker at home and uses them occasionally.  I checked the boxes for these options and placed form in Dr. Synthia Innocent box.  Pt requests call when form is ready.

## 2020-05-22 NOTE — Assessment & Plan Note (Addendum)
Chronic, deteriorated.  Reviewed natural course of diabetes balanced with need to avoid hypoglycemia in elderly. Will refer to diabetes education classes.  Has tolerated higher metformin doses. Will increase to metformin 500/1000mg  daily. He would be open to weekly GLP1 RA. Reasonable to keep endo appt next week for evaluation.  RTC 6wks DM f/u visit - when next due for A1c (unless endo checks sooner)

## 2020-05-22 NOTE — Assessment & Plan Note (Signed)
BP mildly elevated - amlodipine titration limited by pedal edema. No med changes today, will continue to watch.

## 2020-05-26 ENCOUNTER — Encounter: Payer: Medicare HMO | Attending: Family Medicine | Admitting: Registered"

## 2020-05-26 ENCOUNTER — Encounter: Payer: Self-pay | Admitting: Registered"

## 2020-05-26 ENCOUNTER — Other Ambulatory Visit: Payer: Self-pay

## 2020-05-26 ENCOUNTER — Encounter: Payer: Self-pay | Admitting: Family Medicine

## 2020-05-26 DIAGNOSIS — E1149 Type 2 diabetes mellitus with other diabetic neurological complication: Secondary | ICD-10-CM | POA: Insufficient documentation

## 2020-05-26 DIAGNOSIS — E782 Mixed hyperlipidemia: Secondary | ICD-10-CM | POA: Diagnosis not present

## 2020-05-26 DIAGNOSIS — E1142 Type 2 diabetes mellitus with diabetic polyneuropathy: Secondary | ICD-10-CM | POA: Diagnosis not present

## 2020-05-26 DIAGNOSIS — E1165 Type 2 diabetes mellitus with hyperglycemia: Secondary | ICD-10-CM | POA: Diagnosis not present

## 2020-05-26 DIAGNOSIS — I10 Essential (primary) hypertension: Secondary | ICD-10-CM | POA: Diagnosis not present

## 2020-05-26 NOTE — Telephone Encounter (Signed)
Spoke with pt notifying him the form is ready to pick up and there is a $5.00 fee to have it completed.  Pt verbalizes understanding.   [Placed form at front office- yellow folders.  Made copy to scan and one for billing.] 

## 2020-05-26 NOTE — Progress Notes (Signed)
Diabetes Self-Management Education  Visit Type: First/Initial  Appt. Start Time: 0950 Appt. End Time: 1100  05/26/2020  [name], identified by name and date of birth, is a 86 y.o. male with a diagnosis of Diabetes: Type 2.   ASSESSMENT  There were no vitals taken for this visit. There is no height or weight on file to calculate BMI.   Patient states for the first 10 years he had diabetes he was motivated to manage with diet and states he only ate enough to survive and went hungry for 10 yrs. Pt reports he started at 225 lbs and then weighted 185 lbs. Pt states when diet alone no longer worked he started metformin. Pt states he wants to have a hip replacement due to constant pain and increase mobility. Patient is very active and still plants and tends a large garden.   Pt states much of his family has diabetes. Pt reports he grew up eating bread and biscuits, grits, etc and "eats what he wants to eat." Other than potentially excess biscuits and bread, patients dietary recall sounds fairly healthy with no sweetened beverages. Pt reports he quit drinking soda 3-4 yrs ago.  Patient states he has talked to friends and family members who use Ozempic and Trulicity and have good blood sugar control. Patient was interested in knowing side effects. Pt states he wants to be careful in taking care of his kidneys.    Diabetes Self-Management Education - 05/26/20 1004      Visit Information   Visit Type First/Initial      Initial Visit   Diabetes Type Type 2    Are you currently following a meal plan? No    Are you taking your medications as prescribed? Yes   metfomrin   Date Diagnosed 2001      Health Coping   How would you rate your overall health? Fair      Psychosocial Assessment   Patient Belief/Attitude about Diabetes Motivated to manage diabetes    How often do you need to have someone help you when you read instructions, pamphlets, or other written materials from your  doctor or pharmacy? 1 - Never    What is the last grade level you completed in school? 12      Complications   Last HgB A1C per patient/outside source 9 %    How often do you check your blood sugar? 3-4 times / week    Fasting Blood glucose range (mg/dL) 130-179;>200   160-225   Postprandial Blood glucose range (mg/dL) >200    Number of hypoglycemic episodes per month 0    Have you had a dilated eye exam in the past 12 months? Yes    Have you had a dental exam in the past 12 months? No    Are you checking your feet? Yes    How many days per week are you checking your feet? 1      Dietary Intake   Breakfast cornflakes splenda, sausage, biscuit, coffee, splenda sugar free hazelnut creamer    Dinner hamburger, bun, sweet peppers, butter beans OR wheat spaghetti, tomatoes    Beverage(s) coffee, water      Exercise   Exercise Type Light (walking / raking leaves)      Patient Education   Previous Diabetes Education No    Nutrition management  Role of diet in the treatment of diabetes and the relationship between the three main macronutrients and blood glucose level    Medications  Reviewed patients medication for diabetes, action, purpose, timing of dose and side effects.      Individualized Goals (developed by patient)   Nutrition General guidelines for healthy choices and portions discussed    Medications Other (comment)   work with your endocrinologist     Outcomes   Expected Outcomes Demonstrated interest in learning. Expect positive outcomes    Future DMSE PRN    Program Status Completed           Individualized Plan for Diabetes Self-Management Training:   Learning Objective:  Patient will have a greater understanding of diabetes self-management. Patient education plan is to attend individual and/or group sessions per assessed needs and concerns.    Patient Instructions  Continue taking medication as prescribed  Aim to eat balance meals. Consider watching how much  bread and biscuits you eat. If you feel you need more food in your meal you can try low sugar protein shake.  Review the medication list so you can know what is available when you go to the endocrinologist today.   Steel cut oats would probably be a better choice than quick oats   Expected Outcomes:  Demonstrated interest in learning. Expect positive outcomes  Education material provided: Planning Healthy Meals; non-insulin diabetes medications, GLP-1 (novonordisk)  If problems or questions, patient to contact team via:  Phone  Future DSME appointment: PRN

## 2020-05-26 NOTE — Patient Instructions (Signed)
Continue taking medication as prescribed  Aim to eat balance meals. Consider watching how much bread and biscuits you eat. If you feel you need more food in your meal you can try low sugar protein shake.  Review the medication list so you can know what is available when you go to the endocrinologist today.   Steel cut oats would probably be a better choice than quick oats

## 2020-06-19 ENCOUNTER — Other Ambulatory Visit: Payer: Self-pay | Admitting: Internal Medicine

## 2020-07-02 ENCOUNTER — Encounter: Payer: Self-pay | Admitting: Family Medicine

## 2020-07-02 ENCOUNTER — Ambulatory Visit: Payer: Medicare HMO | Admitting: Internal Medicine

## 2020-07-02 ENCOUNTER — Ambulatory Visit (INDEPENDENT_AMBULATORY_CARE_PROVIDER_SITE_OTHER): Payer: Medicare HMO | Admitting: Family Medicine

## 2020-07-02 ENCOUNTER — Other Ambulatory Visit: Payer: Self-pay

## 2020-07-02 VITALS — BP 130/64 | HR 86 | Temp 97.6°F | Ht 67.0 in | Wt 164.1 lb

## 2020-07-02 DIAGNOSIS — E1149 Type 2 diabetes mellitus with other diabetic neurological complication: Secondary | ICD-10-CM | POA: Diagnosis not present

## 2020-07-02 LAB — POCT GLYCOSYLATED HEMOGLOBIN (HGB A1C): Hemoglobin A1C: 6.8 % — AB (ref 4.0–5.6)

## 2020-07-02 MED ORDER — METFORMIN HCL 1000 MG PO TABS
1000.0000 mg | ORAL_TABLET | Freq: Two times a day (BID) | ORAL | 3 refills | Status: DC
Start: 2020-07-02 — End: 2021-01-02

## 2020-07-02 NOTE — Assessment & Plan Note (Signed)
Marked improvement over the past month. Anticipate good sugar control.  Saw nutritionist resulting in closer adherence to diabetic diet, has also established with endo, metformin maxed, started on trulicity. Discussed affordability concerns, suggested he talk with endo about ozempic if cheaper alternative.  Update A1c today if <7.5% should be adequately low risk to proceed with planned hip surgery.

## 2020-07-02 NOTE — Progress Notes (Addendum)
Patient ID: John Sanford, male    DOB: 12/28/1932, 86 y.o.   MRN: [id number]  This visit was conducted in person.  BP 130/64 (BP Location: Left Arm, Patient Position: Sitting, Cuff Size: Normal)   Pulse 86   Temp 97.6 F (36.4 C) (Temporal)   Ht _0  (1.702 m)   Wt 164 lb 2 oz (74.4 kg)   SpO2 97%   BMI 25.71 kg/m    CC: DM f/u visit  Subjective:   HPI: John Sanford is a 86 y.o. male presenting on 07/02/2020 for Diabetes (Here for 6 wk f/u.  States endo increased metformin 500 mg to 2 tabs in AM and 2 tabs in PM.  Pt will need extra tabs on refill. )   DM - established with [facility] - Malissa Hippo NP. Compliant with antihyperglycemic regimen which includes: metformin 570m 2 tab BID. started trulicity 07.34KAweekly - tolerating well (no nausea) but it is expensive, noted 9 lb weight loss. Planned f/u with endo 08/2020. Does regularly check sugars and brings log: low 100s fasting. Denies low sugars or hypoglycemic symptoms. Some foot paresthesias (neuropathy) - managed with gabapentin nightly. Last diabetic eye exam 04/2020. Pneumovax: 11/2017. Prevnar: 02/2014. Glucometer brand: one-touch ultra 2. DSME: saw nutritionist 05/2020 - has cut down on bread/carbs. Lab Results  Component Value Date   HGBA1C 6.8 (A) 07/02/2020   Diabetic Foot Exam - Simple   No data filed     Lab Results  Component Value Date   MICROALBUR 1.2 02/01/2012         Relevant past medical, surgical, family and social history reviewed and updated as indicated. Interim medical history since our last visit reviewed. Allergies and medications reviewed and updated. Outpatient Medications Prior to Visit  Medication Sig Dispense Refill  . amLODipine (NORVASC) 5 MG tablet Take 1 tablet (5 mg total) by mouth daily. 90 tablet 0  . aspirin 81 MG tablet Take 81 mg by mouth daily.      . Blood Glucose Monitoring Suppl (ONE TOUCH ULTRA 2) w/Device KIT Use as instructed to check  blood sugar once daily. 1 kit 0  . docusate sodium (COLACE) 100 MG capsule Take 100 mg by mouth daily. OTC    . gabapentin (NEURONTIN) 300 MG capsule TAKE 1-2 CAPSULES BY MOUTH AT BEDTIME (Patient taking differently: Take 300 mg by mouth at bedtime. ) 60 capsule 5  . glucose blood test strip Use as instructed to check blood sugar once daily. 100 each 3  . Lancets (ONETOUCH DELICA PLUS [id number] MISC Use as instructed to check blood sugar once daily. 100 each 3  . losartan (COZAAR) 100 MG tablet TAKE 1 TABLET BY MOUTH EVERY DAY 90 tablet 3  . pravastatin (PRAVACHOL) 80 MG tablet TAKE 1 TABLET BY MOUTH EVERY DAY 90 tablet 0  . TRULICITY 02.03MTD/9.7CBSOPN Inject into the skin.    . metFORMIN (GLUCOPHAGE) 500 MG tablet Take 1 tablet (500 mg total) by mouth daily with breakfast AND 2 tablets (1,000 mg total) at bedtime. (Patient taking differently: Take 2 tablets (1,000 mg total) by mouth daily with breakfast AND 2 tablets (1,000 mg total) at bedtime.) 270 tablet 1   No facility-administered medications prior to visit.     Per HPI unless specifically indicated in ROS section below Review of Systems Objective:  BP 130/64 (BP Location: Left Arm, Patient Position: Sitting, Cuff Size: Normal)   Pulse 86   Temp 97.6 F (36.4  C) (Temporal)   Ht _0  (1.702 m)   Wt 164 lb 2 oz (74.4 kg)   SpO2 97%   BMI 25.71 kg/m   Wt Readings from Last 3 Encounters:  07/02/20 164 lb 2 oz (74.4 kg)  05/21/20 173 lb 5 oz (78.6 kg)  10/23/19 173 lb (78.5 kg)      Physical Exam Vitals and nursing note reviewed.  Constitutional:      General: He is not in acute distress.    Appearance: Normal appearance. He is well-developed. He is not ill-appearing.  Eyes:     General: No scleral icterus.    Extraocular Movements: Extraocular movements intact.     Conjunctiva/sclera: Conjunctivae normal.     Pupils: Pupils are equal, round, and reactive to light.  Cardiovascular:     Rate and Rhythm: Normal rate and  regular rhythm.     Pulses: Normal pulses.     Heart sounds: Normal heart sounds. No murmur heard.   Pulmonary:     Effort: Pulmonary effort is normal. No respiratory distress.     Breath sounds: Normal breath sounds. No wheezing, rhonchi or rales.  Musculoskeletal:     Cervical back: Normal range of motion and neck supple.     Right lower leg: No edema.     Left lower leg: No edema.     Comments: See HPI for foot exam if done  Lymphadenopathy:     Cervical: No cervical adenopathy.  Skin:    General: Skin is warm and dry.     Findings: No rash.  Neurological:     Mental Status: He is alert.  Psychiatric:        Mood and Affect: Mood normal.        Behavior: Behavior normal.       Results for orders placed or performed in visit on 07/02/20  POCT glycosylated hemoglobin (Hb A1C)  Result Value Ref Range   Hemoglobin A1C 6.8 (A) 4.0 - 5.6 %   HbA1c POC (<> result, manual entry)     HbA1c, POC (prediabetic range)     HbA1c, POC (controlled diabetic range)     Assessment & Plan:  This visit occurred during the SARS-CoV-2 public health emergency.  Safety protocols were in place, including screening questions prior to the visit, additional usage of staff PPE, and extensive cleaning of exam room while observing appropriate contact time as indicated for disinfecting solutions.   Problem List Items Addressed This Visit    Type 2 diabetes mellitus with neurological manifestations, controlled ([place]) - Primary    Marked improvement over the past month. Anticipate good sugar control.  Saw nutritionist resulting in closer adherence to diabetic diet, has also established with endo, metformin maxed, started on trulicity. Discussed affordability concerns, suggested he talk with endo about ozempic if cheaper alternative.  Update A1c today if <7.5% should be adequately low risk to proceed with planned hip surgery.       Relevant Medications   TRULICITY 5.32 DJ/2.4QA SOPN   metFORMIN  (GLUCOPHAGE) 1000 MG tablet   Other Relevant Orders   POCT glycosylated hemoglobin (Hb A1C) (Completed)       Meds ordered this encounter  Medications  . metFORMIN (GLUCOPHAGE) 1000 MG tablet    Sig: Take 1 tablet (1,000 mg total) by mouth 2 (two) times daily with a meal.    Dispense:  180 tablet    Refill:  3    Note new dose - hold until next fill  due   Orders Placed This Encounter  Procedures  . POCT glycosylated hemoglobin (Hb A1C)    Patient Instructions  A1c today.  If Ozempic is cheaper, it may be a good alternative instead of Trulicity (based on affordability). These are similar medicines.  You are doing well today - continue current medicines.  Schedule physical in 6 months (wellness visit with health advisor and physical with me).   Follow up plan: Return in about 6 months (around 12/30/2020), or if symptoms worsen or fail to improve, for medicare wellness visit, annual exam, prior fasting for blood work.  Ria Bush, MD     ADDENDUM ==> 07/23/2020 Received preop request from [place] - for upcoming L anterior hip replacement under spinal anesthesia on 07/29/2020.      BP Readings from Last 3 Encounters:  07/02/20 130/64  05/21/20 (!) 150/62  10/23/19 138/70    Recent Labs       Lab Results  Component Value Date   HGBA1C 6.8 (A) 07/02/2020      RCRI = 0.  Reviewed latest CXR (03/2020 - aortic calcifications, ?emphysema) and EKG (03/2020 - NSR rate 70, LAD, normal intervals, q waves anteroseptally).   Planned rpt CXR and labs prior to surgery.   Patient denies recent chest pain/tightness, dyspnea, dizziness, headache or palpitations   Given abnormal EKG 03/2020 and again 06/2020 with anteroseptal Q waves, recommend cardiology clearance - seen by Dr Rockey Situ 07/22/2020.

## 2020-07-02 NOTE — Patient Instructions (Addendum)
A1c today.  If Ozempic is cheaper, it may be a good alternative instead of Trulicity (based on affordability). These are similar medicines.  You are doing well today - continue current medicines.  Schedule physical in 6 months (wellness visit with health advisor and physical with me).

## 2020-07-16 ENCOUNTER — Other Ambulatory Visit: Payer: Self-pay | Admitting: Orthopaedic Surgery

## 2020-07-21 ENCOUNTER — Other Ambulatory Visit: Payer: Self-pay | Admitting: Orthopaedic Surgery

## 2020-07-21 ENCOUNTER — Telehealth: Payer: Self-pay | Admitting: Family Medicine

## 2020-07-21 DIAGNOSIS — R9431 Abnormal electrocardiogram [ECG] [EKG]: Secondary | ICD-10-CM

## 2020-07-21 NOTE — Telephone Encounter (Signed)
Received preop request from [facility] - for upcoming L anterior hip replacement under spinal anesthesia on 07/28/2020.   BP Readings from Last 3 Encounters:  07/02/20 130/64  05/21/20 (!) 150/62  10/23/19 138/70    Lab Results  Component Value Date   HGBA1C 6.8 (A) 07/02/2020    RCRI = 0.  Reviewed latest CXR (03/2020 - aortic calcifications, ?emphysema) and EKG (03/2020 - NSR rate 70, LAD, normal intervals, q waves anteroseptally).   Planned rpt EKG/CXR and labs prior to surgery.   1. Has he recently had any chest pain/tightness, dyspnea, dizziness, headache or palpitations?  2. Given abnormal EKG 03/2020 I want him to come in in next 1-2 days for rpt EKG (nurse visit only). If remaining abnormal, may want to have him see cardiology.

## 2020-07-21 NOTE — Telephone Encounter (Signed)
Pt denies any chest pain/tightness, dyspnea, dizziness, HA or palpitation.  Scheduled for EKG for 10 am tomorrow. Pt verbalized understanding.

## 2020-07-22 ENCOUNTER — Other Ambulatory Visit: Payer: Self-pay

## 2020-07-22 ENCOUNTER — Encounter: Payer: Self-pay | Admitting: Cardiovascular Disease

## 2020-07-22 ENCOUNTER — Ambulatory Visit: Payer: Medicare HMO | Admitting: Cardiovascular Disease

## 2020-07-22 ENCOUNTER — Ambulatory Visit (INDEPENDENT_AMBULATORY_CARE_PROVIDER_SITE_OTHER): Payer: Medicare HMO

## 2020-07-22 VITALS — BP 152/70 | HR 89 | Ht 67.0 in | Wt 161.0 lb

## 2020-07-22 DIAGNOSIS — E1149 Type 2 diabetes mellitus with other diabetic neurological complication: Secondary | ICD-10-CM

## 2020-07-22 DIAGNOSIS — M1612 Unilateral primary osteoarthritis, left hip: Secondary | ICD-10-CM | POA: Diagnosis not present

## 2020-07-22 DIAGNOSIS — I10 Essential (primary) hypertension: Secondary | ICD-10-CM

## 2020-07-22 DIAGNOSIS — E1169 Type 2 diabetes mellitus with other specified complication: Secondary | ICD-10-CM | POA: Diagnosis not present

## 2020-07-22 DIAGNOSIS — Z01818 Encounter for other preprocedural examination: Secondary | ICD-10-CM | POA: Diagnosis not present

## 2020-07-22 DIAGNOSIS — Z934 Other artificial openings of gastrointestinal tract status: Secondary | ICD-10-CM

## 2020-07-22 DIAGNOSIS — Z01810 Encounter for preprocedural cardiovascular examination: Secondary | ICD-10-CM

## 2020-07-22 DIAGNOSIS — E785 Hyperlipidemia, unspecified: Secondary | ICD-10-CM

## 2020-07-22 NOTE — Patient Instructions (Signed)
Take 2 norasc the morning of the surgery  Medication Instructions:  No changes  If you need a refill on your cardiac medications before your next appointment, please call your pharmacy.    Lab work: No new labs needed   If you have labs (blood work) drawn today and your tests are completely normal, you will receive your results only by: Marland Kitchen MyChart Message (if you have MyChart) OR . A paper copy in the mail If you have any lab test that is abnormal or we need to change your treatment, we will call you to review the results.   Testing/Procedures: No new testing needed   Follow-Up: At [facility], you and your health needs are our priority.  As part of our continuing mission to provide you with exceptional heart care, we have created designated Provider Care Teams.  These Care Teams include your primary Cardiologist (physician) and Advanced Practice Providers (APPs -  Physician Assistants and Nurse Practitioners) who all work together to provide you with the care you need, when you need it.  . You will need a follow up appointment as needed  . Providers on your designated Care Team:   . Murray Hodgkins, NP . Christell Faith, PA-C . Marrianne Mood, PA-C  Any Other Special Instructions Will Be Listed Below (If Applicable).  COVID-19 Vaccine Information can be found at: ShippingScam.co.uk For questions related to vaccine distribution or appointments, please email vaccine@[email or web address] .[email or web address] or call [phone].

## 2020-07-22 NOTE — Addendum Note (Signed)
Addended by: Ria Bush on: 07/22/2020 02:09 PM   Modules accepted: Orders

## 2020-07-22 NOTE — Telephone Encounter (Addendum)
EKG remains abnormal with septal q waves.  Will see if we can expedite cardiology evaluation prior to surgery Monday 12/13. Spoke with patient.

## 2020-07-22 NOTE — Progress Notes (Signed)
Cardiology Office Note  Date:  07/22/2020   ID:  RASHOD GOUGEON, DOB 13-Feb-1933, MRN [id number]  PCP:  Ria Bush, MD   Chief Complaint  Patient presents with  . New Patient (Initial Visit)    Preop clearance for L total hip arthroplasty; Meds verbally reviewed with patient.    HPI:  Mr. Sailor Haughn is a 86 year old gentleman with past medical history of Diabetes type 2, HBA1C 6.8 hyperlipidemia HTN Who presents by referral from Dr. Danise Mina for evaluation of his abnormal EKG, preop evaluation prior to hip replacement surgery August 05, 2020  Reports wife died 10-15-06 Very active at baseline, lives alone Manages a large garden during the summer Reports planting over 240 tomato plants 1 acre garden that he tends to himself He is very talkative, hard to get a word in edgewise, family who presents with him provides some of the details  Yesterday up on the roof with a leaf blower cleaning his gutters Denies any chest pain, no shortness of breath, not limited at all Long walk into the office today given the main building entrance was closed and had to walk around the hospital to get to our office, denied having to stop for shortness of breath, no chest pain, able to walk the hallway If walks too quickly is limited by hip pain  EKG personally reviewed by myself on todays visit NSR no ST or T wave changes, EKG suggesting to consider old anterior MI, unable to exclude lead placement  PMH:   has a past medical history of Allergy, Arthritis, BPH (benign prostatic hypertrophy), Cancer ([place]), Diabetes mellitus, Hyperlipidemia, Hypertension, and Pneumonia.  PSH:    Past Surgical History:  Procedure Laterality Date  . COLONOSCOPY    . PROSTATE BIOPSY     negative  . TONSILLECTOMY AND ADENOIDECTOMY    . VENTRAL HERNIA REPAIR     periumbilical    Current Outpatient Medications  Medication Sig Dispense Refill  . amLODipine (NORVASC) 5 MG tablet Take 1 tablet (5 mg total)  by mouth daily. 90 tablet 0  . aspirin 81 MG tablet Take 81 mg by mouth daily.      . Blood Glucose Monitoring Suppl (ONE TOUCH ULTRA 2) w/Device KIT Use as instructed to check blood sugar once daily. 1 kit 0  . docusate sodium (COLACE) 100 MG capsule Take 100 mg by mouth daily. OTC    . gabapentin (NEURONTIN) 300 MG capsule Take 300 mg by mouth at bedtime.    Marland Kitchen glucose blood test strip Use as instructed to check blood sugar once daily. 100 each 3  . Lancets (ONETOUCH DELICA PLUS [id number]) MISC Use as instructed to check blood sugar once daily. 100 each 3  . losartan (COZAAR) 100 MG tablet TAKE 1 TABLET BY MOUTH EVERY DAY 90 tablet 3  . metFORMIN (GLUCOPHAGE) 1000 MG tablet Take 1 tablet (1,000 mg total) by mouth 2 (two) times daily with a meal. 180 tablet 3  . pravastatin (PRAVACHOL) 80 MG tablet TAKE 1 TABLET BY MOUTH EVERY DAY 90 tablet 0  . TRULICITY 8.67 YP/9.5KD SOPN Inject 0.75 mg into the skin every Tuesday.      No current facility-administered medications for this visit.     Allergies:   Patient has no known allergies.   Social History:  The patient  reports that he quit smoking about 47 years ago. His smoking use included cigarettes. He has a 40.00 pack-year smoking history. He has never used smokeless tobacco. He reports that he does  not drink alcohol and does not use drugs.   Family History:   family history includes Diabetes in his brother and father; Heart disease in his brother, father, and mother; Pancreatic cancer in his brother.    Review of Systems: Review of Systems  Constitutional: Negative.   HENT: Negative.   Respiratory: Negative.   Cardiovascular: Negative.   Gastrointestinal: Negative.   Musculoskeletal: Positive for joint pain.  Neurological: Negative.   Psychiatric/Behavioral: Negative.   All other systems reviewed and are negative.   PHYSICAL EXAM: VS:  BP (!) 152/70 (BP Location: Right Arm, Patient Position: Sitting, Cuff Size: Normal)   Pulse 89    Ht 5' 7"  (1.702 m)   Wt 161 lb (73 kg)   SpO2 96%   BMI 25.22 kg/m  , BMI Body mass index is 25.22 kg/m. GEN: Well nourished, well developed, in no acute distress HEENT: normal Neck: no JVD, carotid bruits, or masses Cardiac: RRR; no murmurs, rubs, or gallops,no edema  Respiratory:  clear to auscultation bilaterally, normal work of breathing GI: soft, nontender, nondistended, + BS MS: no deformity or atrophy Skin: warm and dry, no rash Neuro:  Strength and sensation are intact Psych: euthymic mood, full affect    Recent Labs: 04/03/2020: BUN 20; Creatinine, Ser 1.07; Hemoglobin 13.8; Platelets 215; Potassium 5.1; Sodium 138    Lipid Panel Lab Results  Component Value Date   CHOL 175 04/25/2019   HDL 48.40 04/25/2019   LDLCALC 100 (H) 04/25/2019   TRIG 131.0 04/25/2019      Wt Readings from Last 3 Encounters:  07/22/20 161 lb (73 kg)  07/02/20 164 lb 2 oz (74.4 kg)  05/21/20 173 lb 5 oz (78.6 kg)     ASSESSMENT AND PLAN:  Problem List Items Addressed This Visit      Cardiology Problems   Essential hypertension, benign   Relevant Orders   EKG 12-Lead     Other   Type 2 diabetes mellitus with neurological manifestations, controlled ([place]) - Primary   Hyperlipidemia associated with type 2 diabetes mellitus (H[facility])   Osteoarthritis of left hip    Other Visit Diagnoses    Preop cardiovascular exam         Preop cardiovascular exam very active at baseline, takes care of large garden during the summertime at least 1 acre in size Able to walk long distances, at least 4 to 5 METS with no anginal symptoms Nonspecific EKG abnormality, possibly lead placement -No further testing needed at this time, acceptable risk for surgery, next week on his hip  Diabetes type 2 A1c elevated around 9, with repeat down to 6.8 Stressed importance of medication compliance, monitoring his diet  Essential hypertension Anxious today, very gregarious, Recommend to monitor blood  pressure at home Could consider taking amlodipine 10 morning of his surgery     Total encounter time more than 60 minutes  Greater than 50% was spent in counseling and coordination of care with the patient    Signed, Esmond Plants, M.D., Ph.D. [place], [place]

## 2020-07-24 ENCOUNTER — Ambulatory Visit: Payer: Medicare HMO | Admitting: Cardiovascular Disease

## 2020-07-24 NOTE — Progress Notes (Addendum)
COVID Vaccine Completed:  x2 Date COVID Vaccine completed:  09-11-19 & 10-02-19 COVID vaccine manufacturer: [employer]   PCP - Ria Bush, MD Cardiologist - Esmond Plants, MD   Cardiac clearance in note dated 07-22-20 by Dr. Rockey Situ  Chest x-ray - 04-03-20 in Epic EKG - 07-22-20 in Epic Stress Test -  ECHO -  Cardiac Cath -  Pacemaker/ICD device last checked:  Sleep Study -  CPAP -   Fasting Blood Sugar - 91 to 150 Checks Blood Sugar - 1 time a day  Blood Thinner Instructions: Aspirin Instructions:  ASA 81 mg Last Dose:  Anesthesia review: Recent evaluation for abnormal EKG   Patient denies shortness of breath, fever, cough and chest pain at PAT appointment.  Pt able to climb a flight of stairs and perform ADLs independently   Patient verbalized understanding of instructions that were given to them at the PAT appointment. Patient was also instructed that they will need to review over the PAT instructions again at home before surgery.

## 2020-07-24 NOTE — Patient Instructions (Addendum)
DUE TO COVID-19 ONLY ONE VISITOR IS ALLOWED TO COME WITH YOU AND STAY IN THE WAITING ROOM ONLY DURING PRE OP AND PROCEDURE.   IF YOU WILL BE ADMITTED INTO THE HOSPITAL YOU ARE ALLOWED ONE SUPPORT PERSON DURING VISITATION HOURS ONLY (10AM -8PM)   . The support person may change daily. . The support person must pass our screening, gel in and out, and wear a mask at all times, including in the patient's room. . Patients must also wear a mask when staff or their support person are in the room.         Your procedure is scheduled on:  Tuesday, 07-29-20   Report to [facility] Main  Entrance    Report to admitting at 12:30 PM   Call this number if you have problems the morning of surgery [phone]   Do not eat food :After Midnight.   May have liquids until 12:00 PM  day of surgery  CLEAR LIQUID DIET  Foods Allowed                                                                     Foods Excluded  Water, Black Coffee and tea, regular and decaf               liquids that you cannot  Plain Jell-O in any flavor  (No red)                                      see through such as: Fruit ices (not with fruit pulp)                                      milk, soups, orange juice              Iced Popsicles (No red)                                      All solid food                                   Apple juices Sports drinks like Gatorade (No red) Lightly seasoned clear broth or consume(fat free) Sugar, honey syrup   Complete one G2 drink the day of surgery at 12:00 PM the day of surgery.   Oral Hygiene is also important to reduce your risk of infection.                                    Remember - BRUSH YOUR TEETH THE MORNING OF SURGERY WITH YOUR REGULAR TOOTHPASTE   Do NOT smoke after Midnight   Take these medicines the morning of surgery with A SIP OF WATER:  Amlodipine, Pravastatin  How to Manage Your Diabetes Before and After Surgery  Why is it important to control my  blood sugar before and  after surgery? . Improving blood sugar levels before and after surgery helps healing and can limit problems. . A way of improving blood sugar control is eating a healthy diet by: o  Eating less sugar and carbohydrates o  Increasing activity/exercise o  Talking with your doctor about reaching your blood sugar goals . High blood sugars (greater than 180 mg/dL) can raise your risk of infections and slow your recovery, so you will need to focus on controlling your diabetes during the weeks before surgery. . Make sure that the doctor who takes care of your diabetes knows about your planned surgery including the date and location.  How do I manage my blood sugar before surgery? . Check your blood sugar at least 4 times a day, starting 2 days before surgery, to make sure that the level is not too high or low. o Check your blood sugar the morning of your surgery when you wake up and every 2 hours until you get to the Short Stay unit. . If your blood sugar is less than 70 mg/dL, you will need to treat for low blood sugar: o Do not take insulin. o Treat a low blood sugar (less than 70 mg/dL) with  cup of clear juice (cranberry or apple), 4 glucose tablets, OR glucose gel. o Recheck blood sugar in 15 minutes after treatment (to make sure it is greater than 70 mg/dL). If your blood sugar is not greater than 70 mg/dL on recheck, call [phone] for further instructions. . Report your blood sugar to the short stay nurse when you get to Short Stay.  . If you are admitted to the hospital after surgery: o Your blood sugar will be checked by the staff and you will probably be given insulin after surgery (instead of oral diabetes medicines) to make sure you have good blood sugar levels. o The goal for blood sugar control after surgery is 80-180 mg/dL.   WHAT DO I DO ABOUT MY DIABETES MEDICATION?  Marland Kitchen Do not take oral diabetes medicines (pills) the morning of surgery.  . THE DAY BEFORE  SURGERY:  Take Metformin as prescribed.       . THE MORNING OF SURGERY:  Do not take Metformin or Trulicity.  . The day of surgery, do not take other diabetes injectables, including Byetta (exenatide), Bydureon (exenatide ER), Victoza (liraglutide), or Trulicity (dulaglutide).   Reviewed and Endorsed by [facility] Patient Education Committee, August 2015                               You may not have any metal on your body including jewelry, and body piercings             Do not wear lotions, powders, perfumes/cologne, or deodorant             Men may shave face and neck.   Do not bring valuables to the hospital. [place].   Contacts, dentures or bridgework may not be worn into surgery.   Patients discharged the day of surgery will not be allowed to drive home.   Special Instructions: Bring a copy of your healthcare power of attorney and living will documents         the day of surgery if you haven't scanned them in before.              Please read over the following fact  sheets you were given: IF YOU HAVE QUESTIONS ABOUT YOUR PRE OP INSTRUCTIONS PLEASE CALL [phone]   [facility] - Preparing for Surgery Before surgery, you can play an important role.  Because skin is not sterile, your skin needs to be as free of germs as possible.  You can reduce the number of germs on your skin by washing with CHG (chlorahexidine gluconate) soap before surgery.  CHG is an antiseptic cleaner which kills germs and bonds with the skin to continue killing germs even after washing. Please DO NOT use if you have an allergy to CHG or antibacterial soaps.  If your skin becomes reddened/irritated stop using the CHG and inform your nurse when you arrive at Short Stay. Do not shave (including legs and underarms) for at least 48 hours prior to the first CHG shower.  You may shave your face/neck.  Please follow these instructions carefully:  1.  Shower with CHG Soap the  night before surgery and the  morning of surgery.  2.  If you choose to wash your hair, wash your hair first as usual with your normal  shampoo.  3.  After you shampoo, rinse your hair and body thoroughly to remove the shampoo.                             4.  Use CHG as you would any other liquid soap.  You can apply chg directly to the skin and wash.  Gently with a scrungie or clean washcloth.  5.  Apply the CHG Soap to your body ONLY FROM THE NECK DOWN.   Do   not use on face/ open                           Wound or open sores. Avoid contact with eyes, ears mouth and   genitals (private parts).                       Wash face,  Genitals (private parts) with your normal soap.             6.  Wash thoroughly, paying special attention to the area where your    surgery  will be performed.  7.  Thoroughly rinse your body with warm water from the neck down.  8.  DO NOT shower/wash with your normal soap after using and rinsing off the CHG Soap.                9.  Pat yourself dry with a clean towel.            10.  Wear clean pajamas.            11.  Place clean sheets on your bed the night of your first shower and do not  sleep with pets. Day of Surgery : Do not apply any lotions/deodorants the morning of surgery.  Please wear clean clothes to the hospital/surgery center.  FAILURE TO FOLLOW THESE INSTRUCTIONS MAY RESULT IN THE CANCELLATION OF YOUR SURGERY  PATIENT SIGNATURE_________________________________  NURSE SIGNATURE__________________________________  ________________________________________________________________________   John Sanford  An incentive spirometer is a tool that can help keep your lungs clear and active. This tool measures how well you are filling your lungs with each breath. Taking long deep breaths may help reverse or decrease the chance of developing breathing (pulmonary) problems (especially infection) following:  A  long period of time when you are unable to  move or be active. BEFORE THE PROCEDURE   If the spirometer includes an indicator to show your best effort, your nurse or respiratory therapist will set it to a desired goal.  If possible, sit up straight or lean slightly forward. Try not to slouch.  Hold the incentive spirometer in an upright position. INSTRUCTIONS FOR USE  1. Sit on the edge of your bed if possible, or sit up as far as you can in bed or on a chair. 2. Hold the incentive spirometer in an upright position. 3. Breathe out normally. 4. Place the mouthpiece in your mouth and seal your lips tightly around it. 5. Breathe in slowly and as deeply as possible, raising the piston or the ball toward the top of the column. 6. Hold your breath for 3-5 seconds or for as long as possible. Allow the piston or ball to fall to the bottom of the column. 7. Remove the mouthpiece from your mouth and breathe out normally. 8. Rest for a few seconds and repeat Steps 1 through 7 at least 10 times every 1-2 hours when you are awake. Take your time and take a few normal breaths between deep breaths. 9. The spirometer may include an indicator to show your best effort. Use the indicator as a goal to work toward during each repetition. 10. After each set of 10 deep breaths, practice coughing to be sure your lungs are clear. If you have an incision (the cut made at the time of surgery), support your incision when coughing by placing a pillow or rolled up towels firmly against it. Once you are able to get out of bed, walk around indoors and cough well. You may stop using the incentive spirometer when instructed by your caregiver.  RISKS AND COMPLICATIONS  Take your time so you do not get dizzy or light-headed.  If you are in pain, you may need to take or ask for pain medication before doing incentive spirometry. It is harder to take a deep breath if you are having pain. AFTER USE  Rest and breathe slowly and easily.  It can be helpful to keep track of  a log of your progress. Your caregiver can provide you with a simple table to help with this. If you are using the spirometer at home, follow these instructions: [place] IF:   You are having difficultly using the spirometer.  You have trouble using the spirometer as often as instructed.  Your pain medication is not giving enough relief while using the spirometer.  You develop fever of 100.5 F (38.1 C) or higher. SEEK IMMEDIATE MEDICAL CARE IF:   You cough up bloody sputum that had not been present before.  You develop fever of 102 F (38.9 C) or greater.  You develop worsening pain at or near the incision site. MAKE SURE YOU:   Understand these instructions.  Will watch your condition.  Will get help right away if you are not doing well or get worse. Document Released: 12/13/2006 Document Revised: 10/25/2011 Document Reviewed: 02/13/2007 ExitCare Patient Information 2014 Exit[employer], [place].   ________________________________________________________________________  WHAT IS A BLOOD TRANSFUSION? Blood Transfusion Information  A transfusion is the replacement of blood or some of its parts. Blood is made up of multiple cells which provide different functions.  Red blood cells carry oxygen and are used for blood loss replacement.  White blood cells fight against infection.  Platelets control bleeding.  Plasma  helps clot blood.  Other blood products are available for specialized needs, such as hemophilia or other clotting disorders. BEFORE THE TRANSFUSION  Who gives blood for transfusions?   Healthy volunteers who are fully evaluated to make sure their blood is safe. This is blood bank blood. Transfusion therapy is the safest it has ever been in the practice of medicine. Before blood is taken from a donor, a complete history is taken to make sure that person has no history of diseases nor engages in risky social behavior (examples are intravenous drug use or sexual  activity with multiple partners). The donor's travel history is screened to minimize risk of transmitting infections, such as malaria. The donated blood is tested for signs of infectious diseases, such as HIV and hepatitis. The blood is then tested to be sure it is compatible with you in order to minimize the chance of a transfusion reaction. If you or a relative donates blood, this is often done in anticipation of surgery and is not appropriate for emergency situations. It takes many days to process the donated blood. RISKS AND COMPLICATIONS Although transfusion therapy is very safe and saves many lives, the main dangers of transfusion include:   Getting an infectious disease.  Developing a transfusion reaction. This is an allergic reaction to something in the blood you were given. Every precaution is taken to prevent this. The decision to have a blood transfusion has been considered carefully by your caregiver before blood is given. Blood is not given unless the benefits outweigh the risks. AFTER THE TRANSFUSION  Right after receiving a blood transfusion, you will usually feel much better and more energetic. This is especially true if your red blood cells have gotten low (anemic). The transfusion raises the level of the red blood cells which carry oxygen, and this usually causes an energy increase.  The nurse administering the transfusion will monitor you carefully for complications. HOME CARE INSTRUCTIONS  No special instructions are needed after a transfusion. You may find your energy is better. Speak with your caregiver about any limitations on activity for underlying diseases you may have. SEEK MEDICAL CARE IF:   Your condition is not improving after your transfusion.  You develop redness or irritation at the intravenous (IV) site. SEEK IMMEDIATE MEDICAL CARE IF:  Any of the following symptoms occur over the next 12 hours:  Shaking chills.  You have a temperature by mouth above 102 F  (38.9 C), not controlled by medicine.  Chest, back, or muscle pain.  People around you feel you are not acting correctly or are confused.  Shortness of breath or difficulty breathing.  Dizziness and fainting.  You get a rash or develop hives.  You have a decrease in urine output.  Your urine turns a dark color or changes to pink, red, or brown. Any of the following symptoms occur over the next 10 days:  You have a temperature by mouth above 102 F (38.9 C), not controlled by medicine.  Shortness of breath.  Weakness after normal activity.  The white part of the eye turns yellow (jaundice).  You have a decrease in the amount of urine or are urinating less often.  Your urine turns a dark color or changes to pink, red, or brown. Document Released: 07/30/2000 Document Revised: 10/25/2011 Document Reviewed: 03/18/2008 [facility] Patient Information 2014 [place], [place].  _______________________________________________________________________

## 2020-07-25 ENCOUNTER — Encounter (HOSPITAL_COMMUNITY): Payer: Self-pay

## 2020-07-25 ENCOUNTER — Other Ambulatory Visit (HOSPITAL_COMMUNITY)
Admission: RE | Admit: 2020-07-25 | Discharge: 2020-07-25 | Disposition: A | Discharge status: Home / Self Care | Payer: Medicare HMO | Source: Ambulatory Visit | Attending: Orthopaedic Surgery | Admitting: Orthopaedic Surgery

## 2020-07-25 ENCOUNTER — Other Ambulatory Visit: Payer: Self-pay

## 2020-07-25 ENCOUNTER — Encounter (HOSPITAL_COMMUNITY)
Admission: RE | Admit: 2020-07-25 | Discharge: 2020-07-25 | Disposition: A | Discharge status: Home / Self Care | Payer: Medicare HMO | Source: Ambulatory Visit | Attending: Orthopaedic Surgery | Admitting: Orthopaedic Surgery

## 2020-07-25 DIAGNOSIS — Z01812 Encounter for preprocedural laboratory examination: Secondary | ICD-10-CM | POA: Insufficient documentation

## 2020-07-25 DIAGNOSIS — Z20822 Contact with and (suspected) exposure to covid-19: Secondary | ICD-10-CM | POA: Insufficient documentation

## 2020-07-25 LAB — CBC WITH DIFFERENTIAL/PLATELET
Abs Immature Granulocytes: 0.01 10*3/uL (ref 0.00–0.07)
Basophils Absolute: 0 10*3/uL (ref 0.0–0.1)
Basophils Relative: 1 %
Eosinophils Absolute: 0.1 10*3/uL (ref 0.0–0.5)
Eosinophils Relative: 2 %
HCT: 39.5 % (ref 39.0–52.0)
Hemoglobin: 13.3 g/dL (ref 13.0–17.0)
Immature Granulocytes: 0 %
Lymphocytes Relative: 27 %
Lymphs Abs: 1.8 10*3/uL (ref 0.7–4.0)
MCH: 31.6 pg (ref 26.0–34.0)
MCHC: 33.7 g/dL (ref 30.0–36.0)
MCV: 93.8 fL (ref 80.0–100.0)
Monocytes Absolute: 0.6 10*3/uL (ref 0.1–1.0)
Monocytes Relative: 8 %
Neutro Abs: 4.2 10*3/uL (ref 1.7–7.7)
Neutrophils Relative %: 62 %
Platelets: 213 10*3/uL (ref 150–400)
RBC: 4.21 MIL/uL — ABNORMAL LOW (ref 4.22–5.81)
RDW: 13.2 % (ref 11.5–15.5)
WBC: 6.7 10*3/uL (ref 4.0–10.5)
nRBC: 0 % (ref 0.0–0.2)

## 2020-07-25 LAB — URINALYSIS, ROUTINE W REFLEX MICROSCOPIC
Bilirubin Urine: NEGATIVE
Glucose, UA: NEGATIVE mg/dL
Hgb urine dipstick: NEGATIVE
Ketones, ur: NEGATIVE mg/dL
Leukocytes,Ua: NEGATIVE
Nitrite: NEGATIVE
Protein, ur: NEGATIVE mg/dL
Specific Gravity, Urine: 1.013 (ref 1.005–1.030)
pH: 5 (ref 5.0–8.0)

## 2020-07-25 LAB — SARS CORONAVIRUS 2 (TAT 6-24 HRS): SARS Coronavirus 2: NEGATIVE

## 2020-07-25 LAB — BASIC METABOLIC PANEL
Anion gap: 1 — ABNORMAL LOW (ref 5–15)
BUN: 22 mg/dL (ref 8–23)
CO2: 31 mmol/L (ref 22–32)
Calcium: 9.5 mg/dL (ref 8.9–10.3)
Chloride: 103 mmol/L (ref 98–111)
Creatinine, Ser: 1.29 mg/dL — ABNORMAL HIGH (ref 0.61–1.24)
GFR, Estimated: 55 mL/min — ABNORMAL LOW (ref >60)
Glucose, Bld: 124 mg/dL — ABNORMAL HIGH (ref 70–99)
Potassium: 4.1 mmol/L (ref 3.5–5.1)
Sodium: 135 mmol/L (ref 135–145)

## 2020-07-25 LAB — PROTIME-INR
INR: 0.9 (ref 0.8–1.2)
Prothrombin Time: 12 seconds (ref 11.4–15.2)

## 2020-07-25 LAB — APTT: aPTT: 26 seconds (ref 24–36)

## 2020-07-25 LAB — SURGICAL PCR SCREEN
MRSA, PCR: NEGATIVE
Staphylococcus aureus: NEGATIVE

## 2020-07-25 LAB — GLUCOSE, CAPILLARY: Glucose-Capillary: 125 mg/dL — ABNORMAL HIGH (ref 70–99)

## 2020-07-28 ENCOUNTER — Telehealth: Payer: Self-pay | Admitting: Family Medicine

## 2020-07-28 MED ORDER — TRANEXAMIC ACID 1000 MG/10ML IV SOLN
2000.0000 mg | INTRAVENOUS | Status: DC
  Filled 2020-07-28: qty 20

## 2020-07-28 MED ORDER — BUPIVACAINE LIPOSOME 1.3 % IJ SUSP
10.0000 mL | Freq: Once | INTRAMUSCULAR | Status: DC
  Filled 2020-07-28: qty 10

## 2020-07-28 NOTE — H&P (Signed)
TOTAL HIP ADMISSION H&P  Patient is admitted for left total hip arthroplasty.  Subjective:  Chief Complaint: left hip pain  HPI: John Sanford, 86 y.o. male, has a history of pain and functional disability in the left hip(s) due to arthritis and patient has failed non-surgical conservative treatments for greater than 12 weeks to include NSAID's and/or analgesics, corticosteriod injections, flexibility and strengthening excercises, use of assistive devices, weight reduction as appropriate and activity modification.  Onset of symptoms was gradual starting 5 years ago with gradually worsening course since that time.The patient noted no past surgery on the left hip(s).  Patient currently rates pain in the left hip at 10 out of 10 with activity. Patient has night pain, worsening of pain with activity and weight bearing, trendelenberg gait, pain that interfers with activities of daily living and crepitus. Patient has evidence of subchondral cysts, subchondral sclerosis, periarticular osteophytes and joint space narrowing by imaging studies. This condition presents safety issues increasing the risk of falls. There is no current active infection.  Patient Active Problem List   Diagnosis Date Noted  . Osteoarthritis of left hip 10/23/2019  . Fatigue 05/17/2018  . Sleep disturbance 03/21/2015  . Actinic keratoses 09/20/2014  . Advance directive discussed with patient 09/20/2014  . Routine general medical examination at a health care facility 08/30/2012  . BPH without obstruction/lower urinary tract symptoms 01/30/2010  . ALLERGIC RHINITIS 04/30/2008  . COLONIC POLYPS, BENIGN 09/04/2007  . CONSTIPATION 07/26/2007  . Type 2 diabetes mellitus with neurological manifestations, controlled ([place]) 10/20/2006  . Hyperlipidemia associated with type 2 diabetes mellitus ([place]) 10/20/2006  . Essential hypertension, benign 10/20/2006   Past Medical History:  Diagnosis Date  . Allergy   . Arthritis   . BPH  (benign prostatic hypertrophy)   . Cancer (HCC)    hx of skin cancer   . Diabetes mellitus    type 2   . Hyperlipidemia   . Hypertension   . Pneumonia    hx of     Past Surgical History:  Procedure Laterality Date  . CATARACT EXTRACTION W/ INTRAOCULAR LENS IMPLANT    . COLONOSCOPY    . PROSTATE BIOPSY     negative  . TONSILLECTOMY AND ADENOIDECTOMY    . VENTRAL HERNIA REPAIR     periumbilical    Current Facility-Administered Medications  Medication Dose Route Frequency Provider Last Rate Last Admin  . [START ON 07/29/2020] bupivacaine liposome (EXPAREL) 1.3 % injection 133 mg  10 mL Other Once [place]rose Nakayama, MD      . Derrill Memo ON 07/29/2020] tranexamic acid (CYKLOKAPRON) 2,000 mg in sodium chloride 0.9 % 50 mL Topical Application  9,977 mg Topical To OR [place]rose Nakayama, MD       Current Outpatient Medications  Medication Sig Dispense Refill Last Dose  . amLODipine (NORVASC) 5 MG tablet Take 1 tablet (5 mg total) by mouth daily. 90 tablet 0   . aspirin 81 MG tablet Take 81 mg by mouth daily.       Marland Kitchen docusate sodium (COLACE) 100 MG capsule Take 100 mg by mouth daily. OTC     . losartan (COZAAR) 100 MG tablet TAKE 1 TABLET BY MOUTH EVERY DAY 90 tablet 3   . pravastatin (PRAVACHOL) 80 MG tablet TAKE 1 TABLET BY MOUTH EVERY DAY 90 tablet 0   . TRULICITY 4.14 EL/9.5VU SOPN Inject 0.75 mg into the skin every Tuesday.      . Blood Glucose Monitoring Suppl (ONE TOUCH ULTRA 2) w/Device  KIT Use as instructed to check blood sugar once daily. 1 kit 0   . gabapentin (NEURONTIN) 300 MG capsule Take 300 mg by mouth at bedtime.     Marland Kitchen glucose blood test strip Use as instructed to check blood sugar once daily. 100 each 3   . Lancets (ONETOUCH DELICA PLUS GYJEHU31S) MISC Use as instructed to check blood sugar once daily. 100 each 3   . metFORMIN (GLUCOPHAGE) 1000 MG tablet Take 1 tablet (1,000 mg total) by mouth 2 (two) times daily with a meal. 180 tablet 3 Not Taking at Unknown time   No Known  Allergies  Social History   Tobacco Use  . Smoking status: Former Smoker    Packs/day: 2.00    Years: 20.00    Pack years: 40.00    Types: Cigarettes    Quit date: 08/16/1972    Years since quitting: 47.9  . Smokeless tobacco: Never Used  Substance Use Topics  . Alcohol use: No    Comment: rare    Family History  Problem Relation Age of Onset  . Heart disease Mother   . Diabetes Father   . Heart disease Father   . Diabetes Brother   . Heart disease Brother   . Pancreatic cancer Brother      Review of Systems  Musculoskeletal:       Left hip  All other systems reviewed and are negative.   Objective:  Physical Exam Constitutional:      Appearance: Normal appearance.  HENT:     Head: Normocephalic and atraumatic.     Mouth/Throat:     Pharynx: Oropharynx is clear.  Eyes:     Extraocular Movements: Extraocular movements intact.  Cardiovascular:     Rate and Rhythm: Normal rate and regular rhythm.  Pulmonary:     Effort: Pulmonary effort is normal.  Abdominal:     Palpations: Abdomen is soft.  Musculoskeletal:     Cervical back: Normal range of motion.     Comments: Left hip motion is quite limited and painful.  He walks with a significant limp.  Sensation and motor function are intact distally.  He has palpable pulses in his feet.    Neurological:     General: No focal deficit present.     Mental Status: He is alert and oriented to person, place, and time.  Psychiatric:        Mood and Affect: Mood normal.        Behavior: Behavior normal.        Thought Content: Thought content normal.        Judgment: Judgment normal.     Vital signs in last 24 hours:    Labs:   Estimated body mass index is 25.26 kg/m as calculated from the following:   Height as of 07/25/20: 5' 7" (1.702 m).   Weight as of 07/25/20: 73.1 kg.   Imaging Review Plain radiographs demonstrate severe degenerative joint disease of the left hip(s). The bone quality appears to be good  for age and reported activity level.      Assessment/Plan:  End stage primary arthritis, left hip(s)  The patient history, physical examination, clinical judgement of the provider and imaging studies are consistent with end stage degenerative joint disease of the left hip(s) and total hip arthroplasty is deemed medically necessary. The treatment options including medical management, injection therapy, arthroscopy and arthroplasty were discussed at length. The risks and benefits of total hip arthroplasty were presented and reviewed.  The risks due to aseptic loosening, infection, stiffness, dislocation/subluxation,  thromboembolic complications and other imponderables were discussed.  The patient acknowledged the explanation, agreed to proceed with the plan and consent was signed. Patient is being admitted for inpatient treatment for surgery, pain control, PT, OT, prophylactic antibiotics, VTE prophylaxis, progressive ambulation and ADL's and discharge planning.The patient is planning to be discharged home with home health services

## 2020-07-28 NOTE — Anesthesia Preprocedure Evaluation (Addendum)
Anesthesia Evaluation  Patient identified by MRN, date of birth, ID band Patient awake    Reviewed: Allergy & Precautions, NPO status , Patient's Chart, lab work & pertinent test results, reviewed documented beta blocker date and time   Airway Mallampati: II  TM Distance: >3 FB Neck ROM: Full    Dental no notable dental hx. (+) Teeth Intact, Caps   Pulmonary pneumonia, resolved, former smoker,    Pulmonary exam normal breath sounds clear to auscultation       Cardiovascular hypertension, Pt. on medications Normal cardiovascular exam Rhythm:Regular Rate:Normal     Neuro/Psych Diabetic peripheral neuropathy negative psych ROS   GI/Hepatic negative GI ROS, Neg liver ROS,   Endo/Other  diabetes, Well Controlled, Type 2, Oral Hypoglycemic AgentsHyperlipidemia  Renal/GU Renal InsufficiencyRenal disease  negative genitourinary   Musculoskeletal  (+) Arthritis , Osteoarthritis,  OA left hip   Abdominal   Peds  Hematology negative hematology ROS (+)   Anesthesia Other Findings   Reproductive/Obstetrics                            Anesthesia Physical Anesthesia Plan  ASA: II  Anesthesia Plan: Spinal   Post-op Pain Management:    Induction: Intravenous  PONV Risk Score and Plan: Ondansetron and Treatment may vary due to age or medical condition  Airway Management Planned: Natural Airway, Nasal Cannula and Simple Face Mask  Additional Equipment:   Intra-op Plan:   Post-operative Plan:   Informed Consent: I have reviewed the patients History and Physical, chart, labs and discussed the procedure including the risks, benefits and alternatives for the proposed anesthesia with the patient or authorized representative who has indicated his/her understanding and acceptance.       Plan Discussed with: CRNA and Anesthesiologist  Anesthesia Plan Comments: (Per cardiology 07/22/20, "Preop  cardiovascular exam very active at baseline, takes care of large garden during the summertime at least 1 acre in size Able to walk long distances, at least 4 to 5 METS with no anginal symptoms Nonspecific EKG abnormality, possibly lead placement -No further testing needed at this time, acceptable risk for surgery, next week on his hip")       Anesthesia Quick Evaluation

## 2020-07-28 NOTE — Telephone Encounter (Signed)
PT CALLED IN WANTED TO KNOW IF HE HAS A LIVING WILL IN HIS CHART

## 2020-07-29 ENCOUNTER — Observation Stay (HOSPITAL_COMMUNITY)
Admission: RE | Admit: 2020-07-29 | Discharge: 2020-07-30 | Disposition: A | Discharge status: Home / Self Care | Payer: Medicare HMO | Attending: Orthopaedic Surgery | Admitting: Orthopaedic Surgery

## 2020-07-29 ENCOUNTER — Telehealth (HOSPITAL_COMMUNITY): Payer: Self-pay | Admitting: *Deleted

## 2020-07-29 ENCOUNTER — Ambulatory Visit (HOSPITAL_COMMUNITY): Payer: Medicare HMO | Admitting: Physician Assistant

## 2020-07-29 ENCOUNTER — Encounter (HOSPITAL_COMMUNITY): Payer: Self-pay | Admitting: Orthopaedic Surgery

## 2020-07-29 ENCOUNTER — Ambulatory Visit (HOSPITAL_COMMUNITY): Payer: Medicare HMO | Admitting: Certified Registered Nurse Anesthetist

## 2020-07-29 ENCOUNTER — Encounter (HOSPITAL_COMMUNITY)
Admission: RE | Disposition: A | Discharge status: Home / Self Care | Payer: Self-pay | Source: Home / Self Care | Attending: Orthopaedic Surgery

## 2020-07-29 ENCOUNTER — Ambulatory Visit (HOSPITAL_COMMUNITY): Payer: Medicare HMO

## 2020-07-29 DIAGNOSIS — Z01818 Encounter for other preprocedural examination: Secondary | ICD-10-CM

## 2020-07-29 DIAGNOSIS — L570 Actinic keratosis: Secondary | ICD-10-CM | POA: Diagnosis not present

## 2020-07-29 DIAGNOSIS — E785 Hyperlipidemia, unspecified: Secondary | ICD-10-CM | POA: Diagnosis not present

## 2020-07-29 DIAGNOSIS — Z96642 Presence of left artificial hip joint: Secondary | ICD-10-CM | POA: Diagnosis not present

## 2020-07-29 DIAGNOSIS — Z7982 Long term (current) use of aspirin: Secondary | ICD-10-CM | POA: Insufficient documentation

## 2020-07-29 DIAGNOSIS — Z7984 Long term (current) use of oral hypoglycemic drugs: Secondary | ICD-10-CM | POA: Diagnosis not present

## 2020-07-29 DIAGNOSIS — Z85820 Personal history of malignant melanoma of skin: Secondary | ICD-10-CM | POA: Diagnosis not present

## 2020-07-29 DIAGNOSIS — Z79899 Other long term (current) drug therapy: Secondary | ICD-10-CM | POA: Diagnosis not present

## 2020-07-29 DIAGNOSIS — M1612 Unilateral primary osteoarthritis, left hip: Principal | ICD-10-CM | POA: Diagnosis present

## 2020-07-29 DIAGNOSIS — Z86010 Personal history of colonic polyps: Secondary | ICD-10-CM | POA: Insufficient documentation

## 2020-07-29 DIAGNOSIS — I10 Essential (primary) hypertension: Secondary | ICD-10-CM | POA: Diagnosis not present

## 2020-07-29 DIAGNOSIS — E1140 Type 2 diabetes mellitus with diabetic neuropathy, unspecified: Secondary | ICD-10-CM | POA: Diagnosis not present

## 2020-07-29 DIAGNOSIS — Z471 Aftercare following joint replacement surgery: Secondary | ICD-10-CM | POA: Diagnosis not present

## 2020-07-29 DIAGNOSIS — Z419 Encounter for procedure for purposes other than remedying health state, unspecified: Secondary | ICD-10-CM

## 2020-07-29 DIAGNOSIS — M25552 Pain in left hip: Secondary | ICD-10-CM | POA: Diagnosis present

## 2020-07-29 HISTORY — PX: TOTAL HIP ARTHROPLASTY: SHX124

## 2020-07-29 LAB — GLUCOSE, CAPILLARY
Glucose-Capillary: 98 mg/dL (ref 70–99)
Glucose-Capillary: 99 mg/dL (ref 70–99)
Glucose-Capillary: 171 mg/dL — ABNORMAL HIGH (ref 70–99)

## 2020-07-29 LAB — TYPE AND SCREEN
ABO/RH(D): O NEG
Antibody Screen: NEGATIVE

## 2020-07-29 SURGERY — ARTHROPLASTY, HIP, TOTAL, ANTERIOR APPROACH
Anesthesia: Spinal | Site: Hip | Laterality: Left

## 2020-07-29 MED ORDER — ACETAMINOPHEN 325 MG PO TABS: 325.0000 mg | ORAL_TABLET | Freq: Four times a day (QID) | ORAL | Status: DC | PRN

## 2020-07-29 MED ORDER — MORPHINE SULFATE (PF) 4 MG/ML IV SOLN: 0.5000 mg | INTRAVENOUS | Status: DC | PRN

## 2020-07-29 MED ORDER — BUPIVACAINE IN DEXTROSE 0.75-8.25 % IT SOLN
INTRATHECAL | Status: DC | PRN
  Administered 2020-07-29: 1.7 mL via INTRATHECAL

## 2020-07-29 MED ORDER — CEFAZOLIN SODIUM-DEXTROSE 2-4 GM/100ML-% IV SOLN
2.0000 g | Freq: Four times a day (QID) | INTRAVENOUS | Status: AC
  Administered 2020-07-29 – 2020-07-30 (×2): 2 g via INTRAVENOUS
  Filled 2020-07-29 (×2): qty 100

## 2020-07-29 MED ORDER — ONDANSETRON HCL 4 MG/2ML IJ SOLN
INTRAMUSCULAR | Status: DC | PRN
  Administered 2020-07-29: 4 mg via INTRAVENOUS

## 2020-07-29 MED ORDER — METOCLOPRAMIDE HCL 5 MG/ML IJ SOLN: 5.0000 mg | Freq: Three times a day (TID) | INTRAMUSCULAR | Status: DC | PRN

## 2020-07-29 MED ORDER — TRANEXAMIC ACID-NACL 1000-0.7 MG/100ML-% IV SOLN
1000.0000 mg | Freq: Once | INTRAVENOUS | Status: AC
  Administered 2020-07-29: 1000 mg via INTRAVENOUS
  Filled 2020-07-29: qty 100

## 2020-07-29 MED ORDER — HYDROCODONE-ACETAMINOPHEN 7.5-325 MG PO TABS: 1.0000 | ORAL_TABLET | ORAL | Status: DC | PRN

## 2020-07-29 MED ORDER — PROPOFOL 1000 MG/100ML IV EMUL
INTRAVENOUS | Status: AC
  Filled 2020-07-29: qty 100

## 2020-07-29 MED ORDER — PHENOL 1.4 % MT LIQD: 1.0000 | OROMUCOSAL | Status: DC | PRN

## 2020-07-29 MED ORDER — ALUM & MAG HYDROXIDE-SIMETH 200-200-20 MG/5ML PO SUSP: 30.0000 mL | ORAL | Status: DC | PRN

## 2020-07-29 MED ORDER — ORAL CARE MOUTH RINSE: 15.0000 mL | Freq: Once | OROMUCOSAL | Status: AC

## 2020-07-29 MED ORDER — LIDOCAINE HCL (PF) 2 % IJ SOLN
INTRAMUSCULAR | Status: AC
  Filled 2020-07-29: qty 5

## 2020-07-29 MED ORDER — STERILE WATER FOR IRRIGATION IR SOLN
Status: DC | PRN
  Administered 2020-07-29: 2000 mL

## 2020-07-29 MED ORDER — METHOCARBAMOL 500 MG IVPB - SIMPLE MED
500.0000 mg | Freq: Four times a day (QID) | INTRAVENOUS | Status: DC | PRN
  Filled 2020-07-29: qty 50

## 2020-07-29 MED ORDER — 0.9 % SODIUM CHLORIDE (POUR BTL) OPTIME
TOPICAL | Status: DC | PRN
  Administered 2020-07-29: 1000 mL

## 2020-07-29 MED ORDER — BUPIVACAINE-EPINEPHRINE (PF) 0.25% -1:200000 IJ SOLN
INTRAMUSCULAR | Status: DC | PRN
  Administered 2020-07-29: 30 mL via PERINEURAL

## 2020-07-29 MED ORDER — ACETAMINOPHEN 500 MG PO TABS
500.0000 mg | ORAL_TABLET | Freq: Four times a day (QID) | ORAL | Status: DC
  Administered 2020-07-30 (×2): 500 mg via ORAL
  Filled 2020-07-29 (×3): qty 1

## 2020-07-29 MED ORDER — TRANEXAMIC ACID-NACL 1000-0.7 MG/100ML-% IV SOLN
1000.0000 mg | INTRAVENOUS | Status: AC
  Administered 2020-07-29: 1000 mg via INTRAVENOUS
  Filled 2020-07-29: qty 100

## 2020-07-29 MED ORDER — LACTATED RINGERS IV SOLN: INTRAVENOUS | Status: DC

## 2020-07-29 MED ORDER — KETOROLAC TROMETHAMINE 15 MG/ML IJ SOLN
7.5000 mg | Freq: Four times a day (QID) | INTRAMUSCULAR | Status: DC
  Administered 2020-07-30 (×2): 7.5 mg via INTRAVENOUS
  Filled 2020-07-29 (×2): qty 1

## 2020-07-29 MED ORDER — METOCLOPRAMIDE HCL 5 MG PO TABS
5.0000 mg | ORAL_TABLET | Freq: Three times a day (TID) | ORAL | Status: DC | PRN
  Filled 2020-07-29: qty 2

## 2020-07-29 MED ORDER — BUPIVACAINE-EPINEPHRINE (PF) 0.25% -1:200000 IJ SOLN
INTRAMUSCULAR | Status: AC
  Filled 2020-07-29: qty 30

## 2020-07-29 MED ORDER — DIPHENHYDRAMINE HCL 12.5 MG/5ML PO ELIX: 12.5000 mg | ORAL_SOLUTION | ORAL | Status: DC | PRN

## 2020-07-29 MED ORDER — MENTHOL 3 MG MT LOZG: 1.0000 | LOZENGE | OROMUCOSAL | Status: DC | PRN

## 2020-07-29 MED ORDER — CEFAZOLIN SODIUM-DEXTROSE 2-4 GM/100ML-% IV SOLN
2.0000 g | INTRAVENOUS | Status: AC
  Administered 2020-07-29: 2 g via INTRAVENOUS
  Filled 2020-07-29: qty 100

## 2020-07-29 MED ORDER — HYDROCODONE-ACETAMINOPHEN 5-325 MG PO TABS
1.0000 | ORAL_TABLET | ORAL | Status: DC | PRN
  Administered 2020-07-29: 2 via ORAL
  Administered 2020-07-30: 1 via ORAL
  Filled 2020-07-29: qty 1
  Filled 2020-07-29: qty 2

## 2020-07-29 MED ORDER — LACTATED RINGERS IV SOLN
INTRAVENOUS | Status: DC
Start: 2020-07-29 — End: 2020-07-30

## 2020-07-29 MED ORDER — ONDANSETRON HCL 4 MG/2ML IJ SOLN
INTRAMUSCULAR | Status: AC
  Filled 2020-07-29: qty 2

## 2020-07-29 MED ORDER — DOCUSATE SODIUM 100 MG PO CAPS
100.0000 mg | ORAL_CAPSULE | Freq: Two times a day (BID) | ORAL | Status: DC
Start: 2020-07-29 — End: 2020-07-30
  Administered 2020-07-29 – 2020-07-30 (×2): 100 mg via ORAL
  Filled 2020-07-29 (×2): qty 1

## 2020-07-29 MED ORDER — TRANEXAMIC ACID 1000 MG/10ML IV SOLN
INTRAVENOUS | Status: DC | PRN
  Administered 2020-07-29: 2000 mg via TOPICAL

## 2020-07-29 MED ORDER — DULAGLUTIDE 0.75 MG/0.5ML ~~LOC~~ SOAJ: 0.7500 mg | SUBCUTANEOUS | Status: DC

## 2020-07-29 MED ORDER — PHENYLEPHRINE HCL-NACL 10-0.9 MG/250ML-% IV SOLN
INTRAVENOUS | Status: DC | PRN
  Administered 2020-07-29: 50 ug/min via INTRAVENOUS

## 2020-07-29 MED ORDER — PROPOFOL 500 MG/50ML IV EMUL
INTRAVENOUS | Status: DC | PRN
  Administered 2020-07-29: 125 ug/kg/min via INTRAVENOUS

## 2020-07-29 MED ORDER — BISACODYL 5 MG PO TBEC: 5.0000 mg | DELAYED_RELEASE_TABLET | Freq: Every day | ORAL | Status: DC | PRN

## 2020-07-29 MED ORDER — ONDANSETRON HCL 4 MG/2ML IJ SOLN: 4.0000 mg | Freq: Four times a day (QID) | INTRAMUSCULAR | Status: DC | PRN

## 2020-07-29 MED ORDER — LIDOCAINE HCL (CARDIAC) PF 100 MG/5ML IV SOSY
PREFILLED_SYRINGE | INTRAVENOUS | Status: DC | PRN
  Administered 2020-07-29: 60 mg via INTRAVENOUS

## 2020-07-29 MED ORDER — PROPOFOL 10 MG/ML IV BOLUS
INTRAVENOUS | Status: DC | PRN
  Administered 2020-07-29: 40 mg via INTRAVENOUS

## 2020-07-29 MED ORDER — LACTATED RINGERS IV SOLN: INTRAVENOUS | Status: DC | PRN

## 2020-07-29 MED ORDER — LOSARTAN POTASSIUM 50 MG PO TABS
100.0000 mg | ORAL_TABLET | Freq: Every day | ORAL | Status: DC
  Administered 2020-07-29 – 2020-07-30 (×2): 100 mg via ORAL
  Filled 2020-07-29 (×2): qty 2

## 2020-07-29 MED ORDER — DEXAMETHASONE SODIUM PHOSPHATE 10 MG/ML IJ SOLN
INTRAMUSCULAR | Status: AC
  Filled 2020-07-29: qty 1

## 2020-07-29 MED ORDER — PRAVASTATIN SODIUM 20 MG PO TABS
80.0000 mg | ORAL_TABLET | Freq: Every day | ORAL | Status: DC
  Administered 2020-07-30: 80 mg via ORAL
  Filled 2020-07-29: qty 4

## 2020-07-29 MED ORDER — METHOCARBAMOL 500 MG PO TABS: 500.0000 mg | ORAL_TABLET | Freq: Four times a day (QID) | ORAL | Status: DC | PRN

## 2020-07-29 MED ORDER — AMLODIPINE BESYLATE 5 MG PO TABS
5.0000 mg | ORAL_TABLET | Freq: Every day | ORAL | Status: DC
  Administered 2020-07-30: 5 mg via ORAL
  Filled 2020-07-29: qty 1

## 2020-07-29 MED ORDER — BUPIVACAINE LIPOSOME 1.3 % IJ SUSP
INTRAMUSCULAR | Status: DC | PRN
  Administered 2020-07-29: 20 mL

## 2020-07-29 MED ORDER — ASPIRIN 81 MG PO CHEW: 81.0000 mg | CHEWABLE_TABLET | Freq: Two times a day (BID) | ORAL | Status: DC

## 2020-07-29 MED ORDER — GABAPENTIN 300 MG PO CAPS
300.0000 mg | ORAL_CAPSULE | Freq: Every day | ORAL | Status: DC
  Administered 2020-07-29: 300 mg via ORAL
  Filled 2020-07-29: qty 1

## 2020-07-29 MED ORDER — ONDANSETRON HCL 4 MG PO TABS
4.0000 mg | ORAL_TABLET | Freq: Four times a day (QID) | ORAL | Status: DC | PRN
  Filled 2020-07-29: qty 1

## 2020-07-29 MED ORDER — CHLORHEXIDINE GLUCONATE 0.12 % MT SOLN
15.0000 mL | Freq: Once | OROMUCOSAL | Status: AC
  Administered 2020-07-29: 15 mL via OROMUCOSAL

## 2020-07-29 MED ORDER — POVIDONE-IODINE 10 % EX SWAB
2.0000 "application " | Freq: Once | CUTANEOUS | Status: AC
  Administered 2020-07-29: 2 via TOPICAL

## 2020-07-29 SURGICAL SUPPLY — 45 items
ARTICULEZE HEAD (Hips) ×2 IMPLANT
BAG DECANTER FOR FLEXI CONT (MISCELLANEOUS) ×2 IMPLANT
BLADE SAW SGTL 18X1.27X75 (BLADE) ×2 IMPLANT
BOOTIES KNEE HIGH SLOAN (MISCELLANEOUS) ×2 IMPLANT
CELLS DAT CNTRL 66122 CELL SVR (MISCELLANEOUS) ×1 IMPLANT
COVER PERINEAL POST (MISCELLANEOUS) ×2 IMPLANT
COVER SURGICAL LIGHT HANDLE (MISCELLANEOUS) ×2 IMPLANT
COVER WAND RF STERILE (DRAPES) IMPLANT
CUP PINN GRIPTON 54 100 (Cup) ×1 IMPLANT
DECANTER SPIKE VIAL GLASS SM (MISCELLANEOUS) ×2 IMPLANT
DRAPE IMP U-DRAPE 54X76 (DRAPES) ×2 IMPLANT
DRAPE ORTHO SPLIT 77X108 STRL (DRAPES)
DRAPE STERI IOBAN 125X83 (DRAPES) ×2 IMPLANT
DRAPE SURG ORHT 6 SPLT 77X108 (DRAPES) IMPLANT
DRAPE U-SHAPE 47X51 STRL (DRAPES) ×4 IMPLANT
DRSG AQUACEL AG ADV 3.5X 6 (GAUZE/BANDAGES/DRESSINGS) ×2 IMPLANT
DURAPREP 26ML APPLICATOR (WOUND CARE) ×2 IMPLANT
ELECT BLADE TIP CTD 4 INCH (ELECTRODE) ×2 IMPLANT
ELECT REM PT RETURN 15FT ADLT (MISCELLANEOUS) ×2 IMPLANT
ELIMINATOR HOLE APEX DEPUY (Hips) ×1 IMPLANT
GLOVE BIO SURGEON STRL SZ8 (GLOVE) ×4 IMPLANT
GLOVE BIOGEL PI IND STRL 8 (GLOVE) ×2 IMPLANT
GLOVE BIOGEL PI INDICATOR 8 (GLOVE) ×2
GOWN STRL REUS W/TWL XL LVL3 (GOWN DISPOSABLE) ×4 IMPLANT
HEAD ARTICULEZE (Hips) IMPLANT
HOLDER FOLEY CATH W/STRAP (MISCELLANEOUS) ×2 IMPLANT
KIT TURNOVER KIT A (KITS) IMPLANT
LINER NEUTRAL 54X36MM PLUS 4 (Hips) ×1 IMPLANT
MANIFOLD NEPTUNE II (INSTRUMENTS) ×2 IMPLANT
NEEDLE HYPO 22GX1.5 SAFETY (NEEDLE) ×3 IMPLANT
NS IRRIG 1000ML POUR BTL (IV SOLUTION) ×2 IMPLANT
PACK ANTERIOR HIP CUSTOM (KITS) ×2 IMPLANT
PENCIL SMOKE EVACUATOR (MISCELLANEOUS) IMPLANT
PROTECTOR NERVE ULNAR (MISCELLANEOUS) ×2 IMPLANT
RETRACTOR WND ALEXIS 18 MED (MISCELLANEOUS) ×1 IMPLANT
RTRCTR WOUND ALEXIS 18CM MED (MISCELLANEOUS) ×2
STEM FEMORAL SZ6 HIGH ACTIS (Stem) ×1 IMPLANT
SUT ETHIBOND NAB CT1 #1 30IN (SUTURE) ×5 IMPLANT
SUT VIC AB 1 CT1 36 (SUTURE) ×2 IMPLANT
SUT VIC AB 2-0 CT1 27 (SUTURE) ×2
SUT VIC AB 2-0 CT1 TAPERPNT 27 (SUTURE) ×1 IMPLANT
SUT VICRYL AB 3-0 FS1 BRD 27IN (SUTURE) ×2 IMPLANT
SUT VLOC 180 0 24IN GS25 (SUTURE) ×2 IMPLANT
SYR 50ML LL SCALE MARK (SYRINGE) ×2 IMPLANT
TRAY FOLEY MTR SLVR 16FR STAT (SET/KITS/TRAYS/PACK) ×2 IMPLANT

## 2020-07-29 NOTE — Anesthesia Postprocedure Evaluation (Signed)
Anesthesia Post Note  Patient: PAXTYN BOYAR  Procedure(s) Performed: LEFT TOTAL HIP ARTHROPLASTY ANTERIOR APPROACH (Left Hip)     Patient location during evaluation: PACU Anesthesia Type: Spinal and MAC Level of consciousness: oriented and awake and alert Pain management: pain level controlled Vital Signs Assessment: post-procedure vital signs reviewed and stable Respiratory status: spontaneous breathing, respiratory function stable and patient connected to nasal cannula oxygen Cardiovascular status: blood pressure returned to baseline and stable Postop Assessment: no headache, no backache and no apparent nausea or vomiting Anesthetic complications: no   No complications documented.  Last Vitals:  Vitals:   07/29/20 1830 07/29/20 1845  BP: 118/67 (!) 114/58  Pulse: 66 63  Resp: 19 17  Temp:  (!) 36.1 C  SpO2: 98% 94%    Last Pain:  Vitals:   07/29/20 1845  TempSrc:   PainSc: 0-No pain                 Belenda Cruise P Nikeshia Keetch

## 2020-07-29 NOTE — Op Note (Signed)
PRE-OP DIAGNOSIS:  LEFT HIP DEGENERATIVE JOINT DISEASE POST-OP DIAGNOSIS: same PROCEDURE:  LEFT TOTAL HIP ARTHROPLASTY ANTERIOR APPROACH ANESTHESIA:  Spinal and MAC SURGEON:  Melrose Nakayama MD ASSISTANT:  Loni Dolly PA-C   INDICATIONS FOR PROCEDURE:  The patient is a 86 y.o. male with a long history of a painful hip.  This has persisted despite multiple conservative measures.  The patient has persisted with pain and dysfunction making rest and activity difficult.  A total hip replacement is offered as surgical treatment.  Informed operative consent was obtained after discussion of possible complications including reaction to anesthesia, infection, neurovascular injury, dislocation, DVT, PE, and death.  The importance of the postoperative rehab program to optimize result was stressed with the patient.  SUMMARY OF FINDINGS AND PROCEDURE:  Under the above anesthesia through a anterior approach an the Hana table a left THR was performed.  The patient had severe degenerative change and good bone quality.  We used DePuy components to replace the hip and these were size 6 high offset Actis femur capped with a +5 80m cobalt chrome hip ball.  On the acetabular side we used a size 54 Gription shell with a plus 4 polyethylene liner.  We did use a hole eliminator.  ALoni DollyPA-C assisted throughout and was invaluable to the completion of the case in that he helped position and retract while I performed the procedure.  He also closed simultaneously to help minimize OR time.  I used fluoroscopy throughout the case to check position of components and leg lengths and read all these views myself.  DESCRIPTION OF PROCEDURE:  The patient was taken to the OR suite where the above anesthetic was applied.  The patient was then positioned on the Hana table supine.  All bony prominences were appropriately padded.  Prep and drape was then performed in normal sterile fashion.  The patient was given kefzol preoperative  antibiotic and an appropriate time out was performed.  We then took an anterior approach to the left hip.  Dissection was taken through adipose to the tensor fascia lata fascia.  This structure was incised longitudinally and we dissected in the intermuscular interval just medial to this muscle.  Cobra retractors were placed superior and inferior to the femoral neck superficial to the capsule.  A capsular incision was then made and the retractors were placed along the femoral neck.  Xray was brought in to get a good level for the femoral neck cut which was made with an oscillating saw and osteotome.  The femoral head was removed with a corkscrew.  The acetabulum was exposed and some labral tissues were excised. Reaming was taken to the inside wall of the pelvis and sequentially up to 1 mm smaller than the actual component.  A trial of components was done and then the aforementioned acetabular shell was placed in appropriate tilt and anteversion confirmed by fluoroscopy. The liner was placed along with the hole eliminator and attention was turned to the femur.  The leg was brought down and over into adduction and the elevator bar was used to raise the femur up gently in the wound.  The piriformis was released with care taken to preserve the obturator internus attachment and all of the posterior capsule. The femur was reamed and then broached to the appropriate size.  A trial reduction was done and the aforementioned head and neck assembly gave uKoreathe best stability in extension with external rotation.  Leg lengths were felt to be about equal  by fluoroscopic exam.  The trial components were removed and the wound irrigated.  We then placed the femoral component in appropriate anteversion.  The head was applied to a dry stem neck and the hip again reduced.  It was again stable in the aforementioned position.  The would was irrigated again followed by re-approximation of anterior capsule with ethibond suture. Tensor  fascia was repaired with V-loc suture  followed by deep closure with #O and #2 undyed vicryl.  Skin was closed with subQ stitch and steristrips followed by a sterile dressing.  EBL and IOF can be obtained from anesthesia records.  DISPOSITION:  The patient was extubated in the OR and taken to PACU in stable condition to be admitted to the Orthopedic Surgery for appropriate post-op care to include perioperative antibiotics and DVT prophylaxis.

## 2020-07-29 NOTE — Telephone Encounter (Signed)
Spoke with pt informing him I do not see a Living Will scanned in his chart.  Pt requested Advance Directive be mailed to him.  Mailed requested form.

## 2020-07-29 NOTE — Plan of Care (Signed)

## 2020-07-29 NOTE — Transfer of Care (Signed)
Immediate Anesthesia Transfer of Care Note  Patient: John Sanford  Procedure(s) Performed: LEFT TOTAL HIP ARTHROPLASTY ANTERIOR APPROACH (Left Hip)  Patient Location: PACU  Anesthesia Type:Spinal  Level of Consciousness: drowsy  Airway & Oxygen Therapy: Patient Spontanous Breathing and Patient connected to face mask oxygen  Post-op Assessment: Report given to RN and Post -op Vital signs reviewed and stable  Post vital signs: Reviewed and stable  Last Vitals:  Vitals Value Taken Time  BP    Temp    Pulse    Resp    SpO2      Last Pain:  Vitals:   07/29/20 1424  TempSrc: Oral  PainSc: 0-No pain      Patients Stated Pain Goal: 3 ([id number])  Complications: No complications documented.

## 2020-07-29 NOTE — Interval H&P Note (Signed)
History and Physical Interval Note:  07/29/2020 3:12 PM  John Sanford  has presented today for surgery, with the diagnosis of LEFT HIP DEGENERATIVE JOINT DISEASE.  The various methods of treatment have been discussed with the patient and family. After consideration of risks, benefits and other options for treatment, the patient has consented to  Procedure(s): LEFT TOTAL HIP ARTHROPLASTY ANTERIOR APPROACH (Left) as a surgical intervention.  The patient's history has been reviewed, patient examined, no change in status, stable for surgery.  I have reviewed the patient's chart and labs.  Questions were answered to the patient's satisfaction.     Hessie Dibble

## 2020-07-29 NOTE — Anesthesia Procedure Notes (Signed)
Spinal  Patient location during procedure: OR Start time: 07/29/2020 4:18 PM End time: 07/29/2020 4:22 PM Staffing Performed: anesthesiologist  Anesthesiologist: Josephine Igo, MD Preanesthetic Checklist Completed: patient identified, IV checked, site marked, risks and benefits discussed, surgical consent, monitors and equipment checked, pre-op evaluation and timeout performed Spinal Block Patient position: sitting Prep: DuraPrep and site prepped and draped Patient monitoring: heart rate, cardiac monitor, continuous pulse ox and blood pressure Approach: midline Location: L3-4 Injection technique: single-shot Needle Needle type: Pencan  Needle gauge: 24 G Needle length: 9 cm Needle insertion depth: 6 cm Assessment Sensory level: T4 Additional Notes Patient tolerated procedure well. Adequate sensory level.

## 2020-07-30 ENCOUNTER — Other Ambulatory Visit: Payer: Self-pay

## 2020-07-30 DIAGNOSIS — M1612 Unilateral primary osteoarthritis, left hip: Secondary | ICD-10-CM | POA: Diagnosis not present

## 2020-07-30 LAB — CBC
HCT: 28.6 % — ABNORMAL LOW (ref 39.0–52.0)
Hemoglobin: 9.6 g/dL — ABNORMAL LOW (ref 13.0–17.0)
MCH: 31.7 pg (ref 26.0–34.0)
MCHC: 33.6 g/dL (ref 30.0–36.0)
MCV: 94.4 fL (ref 80.0–100.0)
Platelets: 154 10*3/uL (ref 150–400)
RBC: 3.03 MIL/uL — ABNORMAL LOW (ref 4.22–5.81)
RDW: 13.2 % (ref 11.5–15.5)
WBC: 9.5 10*3/uL (ref 4.0–10.5)
nRBC: 0 % (ref 0.0–0.2)

## 2020-07-30 LAB — BASIC METABOLIC PANEL
Anion gap: 7 (ref 5–15)
BUN: 18 mg/dL (ref 8–23)
CO2: 25 mmol/L (ref 22–32)
Calcium: 8.3 mg/dL — ABNORMAL LOW (ref 8.9–10.3)
Chloride: 104 mmol/L (ref 98–111)
Creatinine, Ser: 1.44 mg/dL — ABNORMAL HIGH (ref 0.61–1.24)
GFR, Estimated: 48 mL/min — ABNORMAL LOW (ref >60)
Glucose, Bld: 165 mg/dL — ABNORMAL HIGH (ref 70–99)
Potassium: 4.2 mmol/L (ref 3.5–5.1)
Sodium: 136 mmol/L (ref 135–145)

## 2020-07-30 LAB — GLUCOSE, CAPILLARY: Glucose-Capillary: 131 mg/dL — ABNORMAL HIGH (ref 70–99)

## 2020-07-30 MED ORDER — INSULIN ASPART 100 UNIT/ML ~~LOC~~ SOLN: 0.0000 [IU] | Freq: Every day | SUBCUTANEOUS | Status: DC

## 2020-07-30 MED ORDER — INSULIN ASPART 100 UNIT/ML ~~LOC~~ SOLN
0.0000 [IU] | Freq: Three times a day (TID) | SUBCUTANEOUS | Status: DC
  Administered 2020-07-30: 2 [IU] via SUBCUTANEOUS

## 2020-07-30 MED ORDER — TRAMADOL HCL 50 MG PO TABS: 50.0000 mg | ORAL_TABLET | Freq: Four times a day (QID) | ORAL | 0 refills | Status: DC | PRN

## 2020-07-30 MED ORDER — ASPIRIN 81 MG PO TABS: 81.0000 mg | ORAL_TABLET | Freq: Two times a day (BID) | ORAL | 0 refills | Status: DC

## 2020-07-30 MED ORDER — TIZANIDINE HCL 2 MG PO TABS: 2.0000 mg | ORAL_TABLET | Freq: Four times a day (QID) | ORAL | 1 refills | Status: DC | PRN

## 2020-07-30 NOTE — Evaluation (Signed)
Physical Therapy Evaluation Patient Details Name: John Sanford MRN: [id number] DOB: 1933-08-02 Today's Date: 07/30/2020   History of Present Illness  86 y.o. male admitted for L AA-THA on 07/29/20. PMH includes DM.  Clinical Impression  Pt ambulated 180' with RW, no loss of balance. He demonstrates good understanding of HEP. He is ready to DC home from PT standpoint.    Follow Up Recommendations Follow surgeon's recommendation for DC plan and follow-up therapies    Equipment Recommendations  None recommended by PT    Recommendations for Other Services       Precautions / Restrictions Precautions Precautions: Fall Precaution Comments: denies falls in past 1 year Restrictions Weight Bearing Restrictions: No Other Position/Activity Restrictions: WBAT      Mobility  Bed Mobility Overal bed mobility: Needs Assistance Bed Mobility: Supine to Sit     Supine to sit: Supervision;HOB elevated     General bed mobility comments: VCs for technique    Transfers Overall transfer level: Needs assistance Equipment used: Rolling walker (2 wheeled) Transfers: Sit to/from Stand Sit to Stand: Min guard         General transfer comment: VCs hand placement  Ambulation/Gait Ambulation/Gait assistance: Min Gaffer (Feet): 180 Feet Assistive device: Rolling walker (2 wheeled) Gait Pattern/deviations: Step-through pattern;Decreased stride length;Narrow base of support Gait velocity: WFL   General Gait Details: VCs sequencing, no loss of balance  Stairs            Wheelchair Mobility    Modified Rankin (Stroke Patients Only)       Balance Overall balance assessment: Modified Independent                                           Pertinent Vitals/Pain Pain Assessment: 0-10 Pain Score: 1  Pain Location: L hip Pain Descriptors / Indicators: Sore Pain Intervention(s): Limited activity within patient's  tolerance;Monitored during session;Premedicated before session;Ice applied    Home Living Family/patient expects to be discharged to:: Private residence Living Arrangements: Children Available Help at Discharge: Family;Neighbor;Available 24 hours/day   Home Access: Level entry     Home Layout: Two level;Able to live on main level with bedroom/bathroom Home Equipment: Gilford Rile - 2 wheels;Bedside commode;Cane - single point;Wheelchair - manual Additional Comments: daughter in law to assist, son lives with pt but works all day    Prior Function Level of Independence: Independent with assistive device(s)         Comments: ambulated with [facility] PTA     Hand Dominance        Extremity/Trunk Assessment   Upper Extremity Assessment Upper Extremity Assessment: Overall WFL for tasks assessed    Lower Extremity Assessment Lower Extremity Assessment: LLE deficits/detail LLE Deficits / Details: SLR -3/5, knee ext -4/5, hip flexion at least 45* AAROM LLE Sensation: WNL LLE Coordination: WNL    Cervical / Trunk Assessment Cervical / Trunk Assessment: Normal  Communication   Communication: No difficulties  Cognition Arousal/Alertness: (P) Awake/alert Behavior During Therapy: (P) WFL for tasks assessed/performed Overall Cognitive Status: (P) Within Functional Limits for tasks assessed                                        General Comments      Exercises Total Joint Exercises Ankle Circles/Pumps: AROM;Both;10  reps Short Arc Quad: AROM;Left;10 reps;Supine Heel Slides: AAROM;Left;15 reps;Supine Hip ABduction/ADduction: AAROM;Left;10 reps;Supine   Assessment/Plan    PT Assessment Patent does not need any further PT services  PT Problem List Decreased activity tolerance;Decreased mobility       PT Treatment Interventions      PT Goals (Current goals can be found in the Care Plan section)  Acute Rehab PT Goals Patient Stated Goal: work in garden PT Goal  Formulation: All assessment and education complete, DC therapy    Frequency     Barriers to discharge        Co-evaluation               AM-PAC PT "6 Clicks" Mobility  Outcome Measure Help needed turning from your back to your side while in a flat bed without using bedrails?: None Help needed moving from lying on your back to sitting on the side of a flat bed without using bedrails?: None Help needed moving to and from a bed to a chair (including a wheelchair)?: None Help needed standing up from a chair using your arms (e.g., wheelchair or bedside chair)?: None Help needed to walk in hospital room?: None Help needed climbing 3-5 steps with a railing? : A Little 6 Click Score: 23    End of Session Equipment Utilized During Treatment: Gait belt Activity Tolerance: Patient tolerated treatment well Patient left: in chair;with call bell/phone within reach;with chair alarm set Nurse Communication: Mobility status PT Visit Diagnosis: Difficulty in walking, not elsewhere classified (R26.2);Pain Pain - Right/Left: Left Pain - part of body: Hip    Time: 8413-2440 PT Time Calculation (min) (ACUTE ONLY): 31 min   Charges:   PT Evaluation $PT Eval Low Complexity: 1 Low PT Treatments $Gait Training: 8-22 mins       John Sanford PT 07/30/2020  Acute Rehabilitation Services Pager [phone] Office [phone]

## 2020-07-30 NOTE — Discharge Summary (Signed)
Patient ID: John Sanford MRN: [id number] DOB/AGE: 1933/05/08 86 y.o.  Admit date: 07/29/2020 Discharge date: 07/30/2020  Admission Diagnoses:  Principal Problem:   Primary localized osteoarthritis of left hip Active Problems:   Primary osteoarthritis of left hip   Discharge Diagnoses:  Same  Past Medical History:  Diagnosis Date   Allergy    Arthritis    BPH (benign prostatic hypertrophy)    Cancer (HCC)    hx of skin cancer    Diabetes mellitus    type 2    Hyperlipidemia    Hypertension    Pneumonia    hx of     Surgeries: Procedure(s): LEFT TOTAL HIP ARTHROPLASTY ANTERIOR APPROACH on 07/29/2020   Consultants:   Discharged Condition: Improved  Hospital Course: John Sanford is an 86 y.o. male who was admitted 07/29/2020 for operative treatment ofPrimary localized osteoarthritis of left hip. Patient has severe unremitting pain that affects sleep, daily activities, and work/hobbies. After pre-op clearance the patient was taken to the operating room on 07/29/2020 and underwent  Procedure(s): LEFT TOTAL HIP ARTHROPLASTY ANTERIOR APPROACH.    Patient was given perioperative antibiotics:  Anti-infectives (From admission, onward)   Start     Dose/Rate Route Frequency Ordered Stop   07/29/20 2200  ceFAZolin (ANCEF) IVPB 2g/100 mL premix        2 g 200 mL/hr over 30 Minutes Intravenous Every 6 hours 07/29/20 1803 07/30/20 0452   07/29/20 1400  ceFAZolin (ANCEF) IVPB 2g/100 mL premix        2 g 200 mL/hr over 30 Minutes Intravenous On call to O.R. 07/29/20 1350 07/29/20 1625       Patient was given sequential compression devices, early ambulation, and chemoprophylaxis to prevent DVT.  Patient benefited maximally from hospital stay and there were no complications.    Recent vital signs:  Patient Vitals for the past 24 hrs:  BP Temp Temp src Pulse Resp SpO2 Height Weight  07/30/20 0533 (!) 112/49 97.9 F (36.6 C) Oral 70 16 98 % -- --   07/30/20 0500 -- -- -- -- -- -- 5' 7"  (1.702 m) 70.3 kg  07/30/20 0128 101/60 97.7 F (36.5 C) Oral 71 16 95 % -- --  07/29/20 2218 (!) 140/55 98.4 F (36.9 C) Oral 69 16 98 % -- --  07/29/20 2121 (!) 133/59 97.7 F (36.5 C) Oral 82 18 100 % -- --  07/29/20 2011 (!) 146/68 97.6 F (36.4 C) Oral 64 16 100 % -- --  07/29/20 1917 136/86 97.7 F (36.5 C) Oral 69 16 100 % -- --  07/29/20 1845 (!) 114/58 (!) 97 F (36.1 C) -- 63 17 94 % -- --  07/29/20 1830 118/67 -- -- 66 19 98 % -- --  07/29/20 1815 (!) 105/54 -- -- 69 19 96 % -- --  07/29/20 1803 114/60 97.6 F (36.4 C) -- 73 17 100 % -- --  07/29/20 1424 (!) 163/73 (!) 97.5 F (36.4 C) Oral 73 16 100 % -- --     Recent laboratory studies:  Recent Labs    07/30/20 0312  WBC 9.5  HGB 9.6*  HCT 28.6*  PLT 154  NA 136  K 4.2  CL 104  CO2 25  BUN 18  CREATININE 1.44*  GLUCOSE 165*  CALCIUM 8.3*     Discharge Medications:   Allergies as of 07/30/2020   No Known Allergies     Medication List    TAKE these medications  amLODipine 5 MG tablet Commonly known as: NORVASC Take 1 tablet (5 mg total) by mouth daily.   aspirin 81 MG tablet Take 1 tablet (81 mg total) by mouth 2 (two) times daily after a meal. What changed: when to take this   docusate sodium 100 MG capsule Commonly known as: COLACE Take 100 mg by mouth daily. OTC   gabapentin 300 MG capsule Commonly known as: NEURONTIN Take 300 mg by mouth at bedtime.   glucose blood test strip Use as instructed to check blood sugar once daily.   losartan 100 MG tablet Commonly known as: COZAAR TAKE 1 TABLET BY MOUTH EVERY DAY   metFORMIN 1000 MG tablet Commonly known as: GLUCOPHAGE Take 1 tablet (1,000 mg total) by mouth 2 (two) times daily with a meal.   ONE TOUCH ULTRA 2 w/Device Kit Use as instructed to check blood sugar once daily.   OneTouch Delica Plus [id number] Misc Use as instructed to check blood sugar once daily.   pravastatin 80 MG  tablet Commonly known as: PRAVACHOL TAKE 1 TABLET BY MOUTH EVERY DAY   tiZANidine 2 MG tablet Commonly known as: ZANAFLEX Take 1 tablet (2 mg total) by mouth every 6 (six) hours as needed for muscle spasms.   traMADol 50 MG tablet Commonly known as: Ultram Take 1 tablet (50 mg total) by mouth every 6 (six) hours as needed for moderate pain or severe pain (post op pain).   Trulicity 0.34 JZ/7.9XT Sopn Generic drug: Dulaglutide Inject 0.75 mg into the skin every Tuesday.            Durable Medical Equipment  (From admission, onward)         Start     Ordered   07/29/20 1924  DME Walker rolling  Once       Question:  Patient needs a walker to treat with the following condition  Answer:  Primary osteoarthritis of left hip   07/29/20 1923   07/29/20 1924  DME 3 n 1  Once        07/29/20 1923   07/29/20 1924  DME Bedside commode  Once       Question:  Patient needs a bedside commode to treat with the following condition  Answer:  Primary osteoarthritis of left hip   07/29/20 1923          Diagnostic Studies: DG C-Arm 1-60 Min-No Report  Result Date: 07/29/2020 Fluoroscopy was utilized by the requesting physician.  No radiographic interpretation.   DG HIP OPERATIVE UNILAT WITH PELVIS LEFT  Result Date: 07/29/2020 CLINICAL DATA:  Elective surgery EXAM: OPERATIVE LEFT HIP (WITH PELVIS IF PERFORMED) 2 VIEWS TECHNIQUE: Fluoroscopic spot image(s) were submitted for interpretation post-operatively. COMPARISON:  None. FINDINGS: Changes of left hip replacement. Normal AP alignment. No hardware bony complicating feature. IMPRESSION: Left hip replacement.  No visible complicating feature. Electronically Signed   By: Rolm Baptise M.D.   On: 07/29/2020 19:31    Disposition: Discharge disposition: 01-Home or Self Care       Discharge Instructions    Call MD / Call 911   Complete by: As directed    If you experience chest pain or shortness of breath, CALL 911 and be  transported to the hospital emergency room.  If you develope a fever above 101 F, pus (white drainage) or increased drainage or redness at the wound, or calf pain, call your surgeon's office.   Constipation Prevention   Complete by: As directed  Drink plenty of fluids.  Prune juice may be helpful.  You may use a stool softener, such as Colace (over the counter) 100 mg twice a day.  Use MiraLax (over the counter) for constipation as needed.   Diet - low sodium heart healthy   Complete by: As directed    Discharge instructions   Complete by: As directed    INSTRUCTIONS AFTER JOINT REPLACEMENT   Remove items at home which could result in a fall. This includes throw rugs or furniture in walking pathways ICE to the affected joint every three hours while awake for 30 minutes at a time, for at least the first 3-5 days, and then as needed for pain and swelling.  Continue to use ice for pain and swelling. You may notice swelling that will progress down to the foot and ankle.  This is normal after surgery.  Elevate your leg when you are not up walking on it.   Continue to use the breathing machine you got in the hospital (incentive spirometer) which will help keep your temperature down.  It is common for your temperature to cycle up and down following surgery, especially at night when you are not up moving around and exerting yourself.  The breathing machine keeps your lungs expanded and your temperature down.   DIET:  As you were doing prior to hospitalization, we recommend a well-balanced diet.  DRESSING / WOUND CARE / SHOWERING  You may shower 3 days after surgery, but keep the wounds dry during showering.  You may use an occlusive plastic wrap (Press'n Seal for example), NO SOAKING/SUBMERGING IN THE BATHTUB.  If the bandage gets wet, change with a clean dry gauze.  If the incision gets wet, pat the wound dry with a clean towel.  ACTIVITY  Increase activity slowly as tolerated, but follow the  weight bearing instructions below.   No driving for 6 weeks or until further direction given by your physician.  You cannot drive while taking narcotics.  No lifting or carrying greater than 10 lbs. until further directed by your surgeon. Avoid periods of inactivity such as sitting longer than an hour when not asleep. This helps prevent blood clots.  You may return to work once you are authorized by your doctor.     WEIGHT BEARING   Weight bearing as tolerated with assist device (walker, cane, etc) as directed, use it as long as suggested by your surgeon or therapist, typically at least 4-6 weeks.   EXERCISES  Results after joint replacement surgery are often greatly improved when you follow the exercise, range of motion and muscle strengthening exercises prescribed by your doctor. Safety measures are also important to protect the joint from further injury. Any time any of these exercises cause you to have increased pain or swelling, decrease what you are doing until you are comfortable again and then slowly increase them. If you have problems or questions, call your caregiver or physical therapist for advice.   Rehabilitation is important following a joint replacement. After just a few days of immobilization, the muscles of the leg can become weakened and shrink (atrophy).  These exercises are designed to build up the tone and strength of the thigh and leg muscles and to improve motion. Often times heat used for twenty to thirty minutes before working out will loosen up your tissues and help with improving the range of motion but do not use heat for the first two weeks following surgery (sometimes heat can increase post-operative  swelling).   These exercises can be done on a training (exercise) mat, on the floor, on a table or on a bed. Use whatever works the best and is most comfortable for you.    Use music or television while you are exercising so that the exercises are a pleasant break in  your day. This will make your life better with the exercises acting as a break in your routine that you can look forward to.   Perform all exercises about fifteen times, three times per day or as directed.  You should exercise both the operative leg and the other leg as well.  Exercises include:   Quad Sets - Tighten up the muscle on the front of the thigh (Quad) and hold for 5-10 seconds.   Straight Leg Raises - With your knee straight (if you were given a brace, keep it on), lift the leg to 60 degrees, hold for 3 seconds, and slowly lower the leg.  Perform this exercise against resistance later as your leg gets stronger.  Leg Slides: Lying on your back, slowly slide your foot toward your buttocks, bending your knee up off the floor (only go as far as is comfortable). Then slowly slide your foot back down until your leg is flat on the floor again.  Angel Wings: Lying on your back spread your legs to the side as far apart as you can without causing discomfort.  Hamstring Strength:  Lying on your back, push your heel against the floor with your leg straight by tightening up the muscles of your buttocks.  Repeat, but this time bend your knee to a comfortable angle, and push your heel against the floor.  You may put a pillow under the heel to make it more comfortable if necessary.   A rehabilitation program following joint replacement surgery can speed recovery and prevent re-injury in the future due to weakened muscles. Contact your doctor or a physical therapist for more information on knee rehabilitation.    CONSTIPATION  Constipation is defined medically as fewer than three stools per week and severe constipation as less than one stool per week.  Even if you have a regular bowel pattern at home, your normal regimen is likely to be disrupted due to multiple reasons following surgery.  Combination of anesthesia, postoperative narcotics, change in appetite and fluid intake all can affect your bowels.    YOU MUST use at least one of the following options; they are listed in order of increasing strength to get the job done.  They are all available over the counter, and you may need to use some, POSSIBLY even all of these options:    Drink plenty of fluids (prune juice may be helpful) and high fiber foods Colace 100 mg by mouth twice a day  Senokot for constipation as directed and as needed Dulcolax (bisacodyl), take with full glass of water  Miralax (polyethylene glycol) once or twice a day as needed.  If you have tried all these things and are unable to have a bowel movement in the first 3-4 days after surgery call either your surgeon or your primary doctor.    If you experience loose stools or diarrhea, hold the medications until you stool forms back up.  If your symptoms do not get better within 1 week or if they get worse, check with your doctor.  If you experience "the worst abdominal pain ever" or develop nausea or vomiting, please contact the office immediately for further recommendations for  treatment.   ITCHING:  If you experience itching with your medications, try taking only a single pain pill, or even half a pain pill at a time.  You can also use Benadryl over the counter for itching or also to help with sleep.   TED HOSE STOCKINGS:  Use stockings on both legs until for at least 2 weeks or as directed by physician office. They may be removed at night for sleeping.  MEDICATIONS:  See your medication summary on the "After Visit Summary" that nursing will review with you.  You may have some home medications which will be placed on hold until you complete the course of blood thinner medication.  It is important for you to complete the blood thinner medication as prescribed.  PRECAUTIONS:  If you experience chest pain or shortness of breath - call 911 immediately for transfer to the hospital emergency department.   If you develop a fever greater that 101 F, purulent drainage from wound,  increased redness or drainage from wound, foul odor from the wound/dressing, or calf pain - CONTACT YOUR SURGEON.                                                   FOLLOW-UP APPOINTMENTS:  If you do not already have a post-op appointment, please call the office for an appointment to be seen by your surgeon.  Guidelines for how soon to be seen are listed in your "After Visit Summary", but are typically between 1-4 weeks after surgery.  OTHER INSTRUCTIONS:   Knee Replacement:  Do not place pillow under knee, focus on keeping the knee straight while resting. CPM instructions: 0-90 degrees, 2 hours in the morning, 2 hours in the afternoon, and 2 hours in the evening. Place foam block, curve side up under heel at all times except when in CPM or when walking.  DO NOT modify, tear, cut, or change the foam block in any way.   DENTAL ANTIBIOTICS:  In most cases prophylactic antibiotics for Dental procdeures after total joint surgery are not necessary.  Exceptions are as follows:  1. History of prior total joint infection  2. Severely immunocompromised (Organ Transplant, cancer chemotherapy, Rheumatoid biologic meds such as [place])  3. Poorly controlled diabetes (A1C &gt; 8.0, blood glucose over 200)  If you have one of these conditions, contact your surgeon for an antibiotic prescription, prior to your dental procedure.   MAKE SURE YOU:  Understand these instructions.  Get help right away if you are not doing well or get worse.    Thank you for letting us be a part of your medical care team.  It is a privilege we respect greatly.  We hope these instructions will help you stay on track for a fast and full recovery!   Increase activity slowly as tolerated   Complete by: As directed        Follow-up Information    Melrose Nakayama, MD. Schedule an appointment as soon as possible for a visit in 2 weeks.   Specialty: Orthopedic Surgery Contact information: [place]  [address] [phone]                Signed: Larwance Sachs Raevon Broom 07/30/2020, 8:19 AM

## 2020-07-30 NOTE — Plan of Care (Signed)
  Problem: Education: Goal: Knowledge of General Education information will improve Description: Including pain rating scale, medication(s)/side effects and non-pharmacologic comfort measures 07/30/2020 0021 by Josepha Pigg, RN Outcome: Progressing 07/29/2020 1936 by Josepha Pigg, RN Outcome: Progressing   Problem: Health Behavior/Discharge Planning: Goal: Ability to manage health-related needs will improve 07/30/2020 0021 by Josepha Pigg, RN Outcome: Progressing 07/29/2020 1936 by Josepha Pigg, RN Outcome: Progressing   Problem: Clinical Measurements: Goal: Ability to maintain clinical measurements within normal limits will improve 07/30/2020 0021 by Josepha Pigg, RN Outcome: Progressing 07/29/2020 1936 by Josepha Pigg, RN Outcome: Progressing Goal: Will remain free from infection 07/30/2020 0021 by Josepha Pigg, RN Outcome: Progressing 07/29/2020 1936 by Josepha Pigg, RN Outcome: Progressing Goal: Diagnostic test results will improve 07/30/2020 0021 by Josepha Pigg, RN Outcome: Progressing 07/29/2020 1936 by Josepha Pigg, RN Outcome: Progressing Goal: Respiratory complications will improve 07/30/2020 0021 by Josepha Pigg, RN Outcome: Progressing 07/29/2020 1936 by Josepha Pigg, RN Outcome: Progressing Goal: Cardiovascular complication will be avoided 07/30/2020 0021 by Josepha Pigg, RN Outcome: Progressing 07/29/2020 1936 by Josepha Pigg, RN Outcome: Progressing   Problem: Activity: Goal: Risk for activity intolerance will decrease 07/30/2020 0021 by Josepha Pigg, RN Outcome: Progressing 07/29/2020 1936 by Josepha Pigg, RN Outcome: Progressing   Problem: Nutrition: Goal: Adequate nutrition will be maintained 07/30/2020 0021 by Josepha Pigg, RN Outcome: Progressing 07/29/2020 1936 by Josepha Pigg, RN Outcome: Progressing   Problem: Coping: Goal: Level  of anxiety will decrease 07/30/2020 0021 by Josepha Pigg, RN Outcome: Progressing 07/29/2020 1936 by Josepha Pigg, RN Outcome: Progressing   Problem: Elimination: Goal: Will not experience complications related to bowel motility 07/30/2020 0021 by Josepha Pigg, RN Outcome: Progressing 07/29/2020 1936 by Josepha Pigg, RN Outcome: Progressing Goal: Will not experience complications related to urinary retention 07/30/2020 0021 by Josepha Pigg, RN Outcome: Progressing 07/29/2020 1936 by Josepha Pigg, RN Outcome: Progressing   Problem: Pain Managment: Goal: General experience of comfort will improve 07/30/2020 0021 by Josepha Pigg, RN Outcome: Progressing 07/29/2020 1936 by Josepha Pigg, RN Outcome: Progressing   Problem: Safety: Goal: Ability to remain free from injury will improve 07/30/2020 0021 by Josepha Pigg, RN Outcome: Progressing 07/29/2020 1936 by Josepha Pigg, RN Outcome: Progressing   Problem: Skin Integrity: Goal: Risk for impaired skin integrity will decrease 07/30/2020 0021 by Josepha Pigg, RN Outcome: Progressing 07/29/2020 1936 by Josepha Pigg, RN Outcome: Progressing

## 2020-07-30 NOTE — TOC Transition Note (Signed)
Transition of Care [facility]) - CM/SW Discharge Note   Patient Details  Name: John Sanford MRN: [id number] Date of Birth: 11/27/1932  [facility] CM/SW Contact:  Lia Hopping, [place] Phone Number: 07/30/2020, 10:20 AM   Clinical Narrative:    Prearranged Therapy Plan: HHPT [facility] Patient confirm he has a RW and 3 in1.    Final next level of care: [place] Barriers to Discharge: No Barriers Identified   Patient Goals and CMS Choice        Discharge Placement                       Discharge Plan and Services                          HH Arranged: PT [place] Agency: [facility] ([facility] Date [place]: 07/30/20 Time [place]: 71 Representative spoke with at [place]: [place] ([place]) Interventions     Readmission Risk Interventions No flowsheet data found.

## 2020-07-30 NOTE — Progress Notes (Signed)
Subjective: 1 Day Post-Op Procedure(s) (LRB): LEFT TOTAL HIP ARTHROPLASTY ANTERIOR APPROACH (Left)  Patient is resting comfortably in bed this morning. He is hoping to go home today.  Activity level:  wbat Diet tolerance:  ok Voiding:  ok Patient reports pain as mild.    Objective: Vital signs in last 24 hours: Temp:  [97 F (36.1 C)-98.4 F (36.9 C)] 97.9 F (36.6 C) (12/15 0533) Pulse Rate:  [63-82] 70 (12/15 0533) Resp:  [16-19] 16 (12/15 0533) BP: (101-163)/(49-86) 112/49 (12/15 0533) SpO2:  [94 %-100 %] 98 % (12/15 0533) Weight:  [70.3 kg] 70.3 kg (12/15 0500)  Labs: Recent Labs    07/30/20 0312  HGB 9.6*   Recent Labs    07/30/20 0312  WBC 9.5  RBC 3.03*  HCT 28.6*  PLT 154   Recent Labs    07/30/20 0312  NA 136  K 4.2  CL 104  CO2 25  BUN 18  CREATININE 1.44*  GLUCOSE 165*  CALCIUM 8.3*   No results for input(s): LABPT, INR in the last 72 hours.  Physical Exam:  Neurologically intact ABD soft Neurovascular intact Sensation intact distally Intact pulses distally Dorsiflexion/Plantar flexion intact Incision: dressing C/D/I and no drainage No cellulitis present Compartment soft  Assessment/Plan:  1 Day Post-Op Procedure(s) (LRB): LEFT TOTAL HIP ARTHROPLASTY ANTERIOR APPROACH (Left) Advance diet Up with therapy D/C IV fluids Discharge home with home health if cleared by PT. Follow up in office 2 weeks post op. Continue on 81mg  asa BID x 4 weeks post op.  Larwance Sachs Tannar Broker 07/30/2020, 8:14 AM

## 2020-07-31 ENCOUNTER — Encounter (HOSPITAL_COMMUNITY): Payer: Self-pay | Admitting: Orthopaedic Surgery

## 2020-08-01 DIAGNOSIS — E785 Hyperlipidemia, unspecified: Secondary | ICD-10-CM | POA: Diagnosis not present

## 2020-08-01 DIAGNOSIS — E1169 Type 2 diabetes mellitus with other specified complication: Secondary | ICD-10-CM | POA: Diagnosis not present

## 2020-08-01 DIAGNOSIS — G479 Sleep disorder, unspecified: Secondary | ICD-10-CM | POA: Diagnosis not present

## 2020-08-01 DIAGNOSIS — Z471 Aftercare following joint replacement surgery: Secondary | ICD-10-CM | POA: Diagnosis not present

## 2020-08-01 DIAGNOSIS — I10 Essential (primary) hypertension: Secondary | ICD-10-CM | POA: Diagnosis not present

## 2020-08-01 DIAGNOSIS — N400 Enlarged prostate without lower urinary tract symptoms: Secondary | ICD-10-CM | POA: Diagnosis not present

## 2020-08-01 DIAGNOSIS — Z96642 Presence of left artificial hip joint: Secondary | ICD-10-CM | POA: Diagnosis not present

## 2020-08-01 DIAGNOSIS — Z8701 Personal history of pneumonia (recurrent): Secondary | ICD-10-CM | POA: Diagnosis not present

## 2020-08-01 DIAGNOSIS — Z86010 Personal history of colonic polyps: Secondary | ICD-10-CM | POA: Diagnosis not present

## 2020-08-01 DIAGNOSIS — Z79899 Other long term (current) drug therapy: Secondary | ICD-10-CM | POA: Diagnosis not present

## 2020-08-01 DIAGNOSIS — Z7984 Long term (current) use of oral hypoglycemic drugs: Secondary | ICD-10-CM | POA: Diagnosis not present

## 2020-08-01 DIAGNOSIS — Z7982 Long term (current) use of aspirin: Secondary | ICD-10-CM | POA: Diagnosis not present

## 2020-08-06 DIAGNOSIS — Z7982 Long term (current) use of aspirin: Secondary | ICD-10-CM | POA: Diagnosis not present

## 2020-08-06 DIAGNOSIS — Z79899 Other long term (current) drug therapy: Secondary | ICD-10-CM | POA: Diagnosis not present

## 2020-08-06 DIAGNOSIS — Z471 Aftercare following joint replacement surgery: Secondary | ICD-10-CM | POA: Diagnosis not present

## 2020-08-06 DIAGNOSIS — I10 Essential (primary) hypertension: Secondary | ICD-10-CM | POA: Diagnosis not present

## 2020-08-06 DIAGNOSIS — Z7984 Long term (current) use of oral hypoglycemic drugs: Secondary | ICD-10-CM | POA: Diagnosis not present

## 2020-08-06 DIAGNOSIS — E785 Hyperlipidemia, unspecified: Secondary | ICD-10-CM | POA: Diagnosis not present

## 2020-08-06 DIAGNOSIS — G479 Sleep disorder, unspecified: Secondary | ICD-10-CM | POA: Diagnosis not present

## 2020-08-06 DIAGNOSIS — N400 Enlarged prostate without lower urinary tract symptoms: Secondary | ICD-10-CM | POA: Diagnosis not present

## 2020-08-06 DIAGNOSIS — E1169 Type 2 diabetes mellitus with other specified complication: Secondary | ICD-10-CM | POA: Diagnosis not present

## 2020-08-06 DIAGNOSIS — Z96642 Presence of left artificial hip joint: Secondary | ICD-10-CM | POA: Diagnosis not present

## 2020-08-08 ENCOUNTER — Emergency Department: Payer: Medicare HMO

## 2020-08-08 ENCOUNTER — Other Ambulatory Visit: Payer: Self-pay

## 2020-08-08 ENCOUNTER — Emergency Department
Admission: EM | Admit: 2020-08-08 | Discharge: 2020-08-08 | Disposition: A | Discharge status: Home / Self Care | Payer: Medicare HMO | Attending: Emergency Medicine | Admitting: Emergency Medicine

## 2020-08-08 DIAGNOSIS — E1169 Type 2 diabetes mellitus with other specified complication: Secondary | ICD-10-CM | POA: Diagnosis not present

## 2020-08-08 DIAGNOSIS — Z85828 Personal history of other malignant neoplasm of skin: Secondary | ICD-10-CM | POA: Insufficient documentation

## 2020-08-08 DIAGNOSIS — Z96642 Presence of left artificial hip joint: Secondary | ICD-10-CM | POA: Diagnosis not present

## 2020-08-08 DIAGNOSIS — R600 Localized edema: Secondary | ICD-10-CM | POA: Insufficient documentation

## 2020-08-08 DIAGNOSIS — R609 Edema, unspecified: Secondary | ICD-10-CM

## 2020-08-08 DIAGNOSIS — Z7982 Long term (current) use of aspirin: Secondary | ICD-10-CM | POA: Insufficient documentation

## 2020-08-08 DIAGNOSIS — Z79899 Other long term (current) drug therapy: Secondary | ICD-10-CM | POA: Insufficient documentation

## 2020-08-08 DIAGNOSIS — Z87891 Personal history of nicotine dependence: Secondary | ICD-10-CM | POA: Diagnosis not present

## 2020-08-08 DIAGNOSIS — Z7984 Long term (current) use of oral hypoglycemic drugs: Secondary | ICD-10-CM | POA: Insufficient documentation

## 2020-08-08 DIAGNOSIS — I10 Essential (primary) hypertension: Secondary | ICD-10-CM | POA: Insufficient documentation

## 2020-08-08 DIAGNOSIS — M7989 Other specified soft tissue disorders: Secondary | ICD-10-CM | POA: Diagnosis not present

## 2020-08-08 NOTE — Discharge Instructions (Addendum)
For your swelling:  When resting, try to keep your left hip elevated above the right, then elevated your left leg with 2-3 pillows, so that it is ideally above the level of your heart.  We are going to wrap your leg to help with swelling. I'd recommend wearing at least knee-high socks or compression stockings (can be purchased at any medical supply store or drug store) to help with the swelling.  Try to minimize the amount of time your legs are down or you are standing on them.   Continue to move regularly to help with the swelling and pain  Follow-up this week with your orthopedist as scheduled. If your swelling persists, you may need a repeat ultrasound and possibly lab work.

## 2020-08-08 NOTE — ED Notes (Signed)
Compression wrapping applied to L leg per Dr. Ellender Hose

## 2020-08-08 NOTE — ED Provider Notes (Signed)
[facility] Emergency Department Provider Note  ____________________________________________   Event Date/Time   First MD Initiated Contact with Patient 08/08/20 1238     (approximate)  I have reviewed the triage vital signs and the nursing notes.   HISTORY  Chief Complaint Hip Pain and Leg Swelling    HPI John Sanford is a 86 y.o. male  With h/o HTN, HLD, DM, here with leg swelling. Pt is s/p recent hip replacement.  He reports he has been doing well and pain ahs been improving. He's been able to walk more over the last few days and has otherwise felt well. He states he has had intermittent L leg swelling "for years," which he had attributed to his hip and pain. The swelling had been improving however until the last 48 hours, when he noticed increasing LLE swelling, which did not necessarily improve with elevation. He had some pitting as well. Denies any associated increased pain. He has had some mild cramping in his legs but this is also not necessarily new. No rash, warmth, or discoloration. No CP, SOB. He states his hip and incision site pain continues to improve. He has been mobile. No known h/o DVT/PE.       Past Medical History:  Diagnosis Date  . Allergy   . Arthritis   . BPH (benign prostatic hypertrophy)   . Cancer (HCC)    hx of skin cancer   . Diabetes mellitus    type 2   . Hyperlipidemia   . Hypertension   . Pneumonia    hx of     Patient Active Problem List   Diagnosis Date Noted  . Primary localized osteoarthritis of left hip 07/29/2020  . Primary osteoarthritis of left hip 07/29/2020  . Osteoarthritis of left hip 10/23/2019  . Fatigue 05/17/2018  . Sleep disturbance 03/21/2015  . Actinic keratoses 09/20/2014  . Advance directive discussed with patient 09/20/2014  . Routine general medical examination at a health care facility 08/30/2012  . BPH without obstruction/lower urinary tract symptoms 01/30/2010  . ALLERGIC  RHINITIS 04/30/2008  . COLONIC POLYPS, BENIGN 09/04/2007  . CONSTIPATION 07/26/2007  . Type 2 diabetes mellitus with neurological manifestations, controlled ([place]) 10/20/2006  . Hyperlipidemia associated with type 2 diabetes mellitus ([place]) 10/20/2006  . Essential hypertension, benign 10/20/2006    Past Surgical History:  Procedure Laterality Date  . CATARACT EXTRACTION W/ INTRAOCULAR LENS IMPLANT    . COLONOSCOPY    . PROSTATE BIOPSY     negative  . TONSILLECTOMY AND ADENOIDECTOMY    . TOTAL HIP ARTHROPLASTY Left 07/29/2020   Procedure: LEFT TOTAL HIP ARTHROPLASTY ANTERIOR APPROACH;  Surgeon: Melrose Nakayama, MD;  Location: [facility] ORS;  Service: Orthopedics;  Laterality: Left;  Marland Kitchen VENTRAL HERNIA REPAIR     periumbilical    Prior to Admission medications   Medication Sig Start Date End Date Taking? Authorizing Provider  amLODipine (NORVASC) 5 MG tablet Take 1 tablet (5 mg total) by mouth daily. 05/22/20   Ria Bush, MD  aspirin 81 MG tablet Take 1 tablet (81 mg total) by mouth 2 (two) times daily after a meal. 07/30/20   Loni Dolly, PA-C  Blood Glucose Monitoring Suppl (ONE TOUCH ULTRA 2) w/Device KIT Use as instructed to check blood sugar once daily. 05/21/20   Ria Bush, MD  docusate sodium (COLACE) 100 MG capsule Take 100 mg by mouth daily. OTC    [provider]  gabapentin (NEURONTIN) 300 MG capsule Take 300 mg  by mouth at bedtime.    [provider]  glucose blood test strip Use as instructed to check blood sugar once daily. 05/21/20   Ria Bush, MD  Lancets [facility] DELICA PLUS [id number]) MISC Use as instructed to check blood sugar once daily. 05/21/20   Ria Bush, MD  losartan (COZAAR) 100 MG tablet TAKE 1 TABLET BY MOUTH EVERY DAY 04/16/20   Venia Carbon, MD  metFORMIN (GLUCOPHAGE) 1000 MG tablet Take 1 tablet (1,000 mg total) by mouth 2 (two) times daily with a meal. 07/02/20   Ria Bush, MD  pravastatin (PRAVACHOL) 80 MG  tablet TAKE 1 TABLET BY MOUTH EVERY DAY 06/19/20   Ria Bush, MD  tiZANidine (ZANAFLEX) 2 MG tablet Take 1 tablet (2 mg total) by mouth every 6 (six) hours as needed for muscle spasms. 07/30/20 07/30/21  Loni Dolly, PA-C  traMADol (ULTRAM) 50 MG tablet Take 1 tablet (50 mg total) by mouth every 6 (six) hours as needed for moderate pain or severe pain (post op pain). 07/30/20 07/30/21  Loni Dolly, PA-C  TRULICITY 3.88 EK/8.0KL SOPN Inject 0.75 mg into the skin every Tuesday.  06/19/20   [provider]    Allergies Patient has no known allergies.  Family History  Problem Relation Age of Onset  . Heart disease Mother   . Diabetes Father   . Heart disease Father   . Diabetes Brother   . Heart disease Brother   . Pancreatic cancer Brother     Social History Social History   Tobacco Use  . Smoking status: Former Smoker    Packs/day: 2.00    Years: 20.00    Pack years: 40.00    Types: Cigarettes    Quit date: 08/16/1972    Years since quitting: 48.0  . Smokeless tobacco: Never Used  Vaping Use  . Vaping Use: Never used  Substance Use Topics  . Alcohol use: No    Comment: rare  . Drug use: No    Review of Systems  Review of Systems  Constitutional: Negative for chills and fever.  HENT: Negative for sore throat.   Respiratory: Negative for shortness of breath.   Cardiovascular: Positive for leg swelling. Negative for chest pain.  Gastrointestinal: Negative for abdominal pain.  Genitourinary: Negative for flank pain.  Musculoskeletal: Negative for neck pain.  Skin: Negative for rash and wound.  Allergic/Immunologic: Negative for immunocompromised state.  Neurological: Negative for weakness and numbness.  Hematological: Does not bruise/bleed easily.  All other systems reviewed and are negative.    ____________________________________________  PHYSICAL EXAM:      VITAL SIGNS: ED Triage Vitals  Enc Vitals Group     BP 08/08/20 1158 125/76     Pulse  Rate 08/08/20 1158 86     Resp 08/08/20 1158 14     Temp 08/08/20 1158 98.9 F (37.2 C)     Temp Source 08/08/20 1158 Oral     SpO2 08/08/20 1158 98 %     Weight --      Height --      Head Circumference --      Peak Flow --      Pain Score 08/08/20 1159 0     Pain Loc --      Pain Edu? --      Excl. in [place]? --      Physical Exam Vitals and nursing note reviewed.  Constitutional:      General: He is not in acute distress.  Appearance: He is well-developed.  HENT:     Head: Normocephalic and atraumatic.  Eyes:     Conjunctiva/sclera: Conjunctivae normal.  Cardiovascular:     Rate and Rhythm: Normal rate and regular rhythm.     Heart sounds: Normal heart sounds. No murmur heard. No friction rub.  Pulmonary:     Effort: Pulmonary effort is normal. No respiratory distress.     Breath sounds: Normal breath sounds. No wheezing or rales.  Abdominal:     General: There is no distension.     Palpations: Abdomen is soft.     Tenderness: There is no abdominal tenderness.  Musculoskeletal:     Cervical back: Neck supple.     Comments: Left hip incision site is c/di. No drainage or erythema. No pain with ROM. Left leg with 2+ pitting edema, asymmetric. No significant TTP. No erythema, duskiness, or color changes. DP/PT pulses 2+ and symmetric bilateral LE. Normal sensation to light touch.  Skin:    General: Skin is warm.     Capillary Refill: Capillary refill takes less than 2 seconds.  Neurological:     Mental Status: He is alert and oriented to person, place, and time.     Motor: No abnormal muscle tone.       ____________________________________________   LABS (all labs ordered are listed, but only abnormal results are displayed)  Labs Reviewed - No data to display  ____________________________________________  EKG:  ________________________________________  RADIOLOGY All imaging, including plain films, CT scans, and ultrasounds, independently reviewed by me, and  interpretations confirmed via formal radiology reads.  ED MD interpretation:   [place]: Negative  Official radiology report(s): US Venous Img Lower Unilateral Left  Result Date: 08/08/2020 CLINICAL DATA:  Left leg swelling for 2 days EXAM: LEFT LOWER EXTREMITY VENOUS DOPPLER ULTRASOUND TECHNIQUE: Gray-scale sonography with graded compression, as well as color Doppler and duplex ultrasound were performed to evaluate the lower extremity deep venous systems from the level of the common femoral vein and including the common femoral, femoral, profunda femoral, popliteal and calf veins including the posterior tibial, peroneal and gastrocnemius veins when visible. The superficial great saphenous vein was also interrogated. Spectral Doppler was utilized to evaluate flow at rest and with distal augmentation maneuvers in the common femoral, femoral and popliteal veins. COMPARISON:  None. FINDINGS: Contralateral Common Femoral Vein: Respiratory phasicity is normal and symmetric with the symptomatic side. No evidence of thrombus. Normal compressibility. Common Femoral Vein: No evidence of thrombus. Normal compressibility, respiratory phasicity and response to augmentation. Saphenofemoral Junction: No evidence of thrombus. Normal compressibility and flow on color Doppler imaging. Profunda Femoral Vein: No evidence of thrombus. Normal compressibility and flow on color Doppler imaging. Femoral Vein: No evidence of thrombus. Normal compressibility, respiratory phasicity and response to augmentation. Popliteal Vein: No evidence of thrombus. Normal compressibility, respiratory phasicity and response to augmentation. Calf Veins: No evidence of thrombus. Normal compressibility and flow on color Doppler imaging. Superficial Great Saphenous Vein: No evidence of thrombus. Normal compressibility. Venous Reflux:  None. Other Findings:  Calf edema is noted. IMPRESSION: No evidence of deep venous thrombosis. Electronically Signed   By:  Inez Catalina M.D.   On: 08/08/2020 13:09    ____________________________________________  PROCEDURES   Procedure(s) performed (including Critical Care):  Procedures  ____________________________________________  INITIAL IMPRESSION / MDM / [place] / ED COURSE  As part of my medical decision making, I reviewed the following data within the [place] notes reviewed and incorporated,  Old chart reviewed, Notes from prior ED visits, and [place] Controlled Substance Database       *RHIAN FUNARI was evaluated in Emergency Department on 08/08/2020 for the symptoms described in the history of present illness. He was evaluated in the context of the global COVID-19 pandemic, which necessitated consideration that the patient might be at risk for infection with the SARS-CoV-2 virus that causes COVID-19. Institutional protocols and algorithms that pertain to the evaluation of patients at risk for COVID-19 are in a state of rapid change based on information released by regulatory bodies including the CDC and federal and state organizations. These policies and algorithms were followed during the patient's care in the ED.  Some ED evaluations and interventions may be delayed as a result of limited staffing during the pandemic.*     Medical Decision Making:  86 yo M here with left leg swelling. Suspect dependent edema with addition of decreased venous return from immobility as well as recent surgery. May also be a component of autonomic dysfunction related to his diabetes. [place] negative for DVT. His distal pulses, sensation are fully intact with no signs of vascular or neurological compromise. No signs of electrolyte abnormality clinically and no new medications. He is ambulatory w/o difficulty. Op site is without any signs of infection and his post-op pain is improving. Will advise compression stockings, encourage elevation and postioning, regular movement, and close  outpt follow-up.  ____________________________________________  FINAL CLINICAL IMPRESSION(S) / ED DIAGNOSES  Final diagnoses:  Leg edema  Dependent edema     MEDICATIONS GIVEN DURING THIS VISIT:  Medications - No data to display   ED Discharge Orders    None       Note:  This document was prepared using Dragon voice recognition software and may include unintentional dictation errors.   Duffy Bruce, MD 08/08/20 1432

## 2020-08-08 NOTE — ED Triage Notes (Signed)
Pt presents via POV c/o left hip pain and LLE swelling since Wednesday. s/p Hip replacement on 07/29/20.

## 2020-08-08 NOTE — ED Notes (Signed)
US to bedside

## 2020-08-11 DIAGNOSIS — E1169 Type 2 diabetes mellitus with other specified complication: Secondary | ICD-10-CM | POA: Diagnosis not present

## 2020-08-11 DIAGNOSIS — Z471 Aftercare following joint replacement surgery: Secondary | ICD-10-CM | POA: Diagnosis not present

## 2020-08-11 DIAGNOSIS — Z79899 Other long term (current) drug therapy: Secondary | ICD-10-CM | POA: Diagnosis not present

## 2020-08-11 DIAGNOSIS — N400 Enlarged prostate without lower urinary tract symptoms: Secondary | ICD-10-CM | POA: Diagnosis not present

## 2020-08-11 DIAGNOSIS — E785 Hyperlipidemia, unspecified: Secondary | ICD-10-CM | POA: Diagnosis not present

## 2020-08-11 DIAGNOSIS — Z7984 Long term (current) use of oral hypoglycemic drugs: Secondary | ICD-10-CM | POA: Diagnosis not present

## 2020-08-11 DIAGNOSIS — Z7982 Long term (current) use of aspirin: Secondary | ICD-10-CM | POA: Diagnosis not present

## 2020-08-11 DIAGNOSIS — G479 Sleep disorder, unspecified: Secondary | ICD-10-CM | POA: Diagnosis not present

## 2020-08-11 DIAGNOSIS — Z96642 Presence of left artificial hip joint: Secondary | ICD-10-CM | POA: Diagnosis not present

## 2020-08-11 DIAGNOSIS — I10 Essential (primary) hypertension: Secondary | ICD-10-CM | POA: Diagnosis not present

## 2020-08-12 DIAGNOSIS — Z96642 Presence of left artificial hip joint: Secondary | ICD-10-CM | POA: Diagnosis not present

## 2020-08-12 DIAGNOSIS — Z471 Aftercare following joint replacement surgery: Secondary | ICD-10-CM | POA: Diagnosis not present

## 2020-08-15 DIAGNOSIS — R69 Illness, unspecified: Secondary | ICD-10-CM | POA: Diagnosis not present

## 2020-08-16 ENCOUNTER — Other Ambulatory Visit: Payer: Self-pay | Admitting: Family Medicine

## 2020-08-22 DIAGNOSIS — Z9889 Other specified postprocedural states: Secondary | ICD-10-CM | POA: Diagnosis not present

## 2020-08-22 DIAGNOSIS — R2689 Other abnormalities of gait and mobility: Secondary | ICD-10-CM | POA: Diagnosis not present

## 2020-08-22 DIAGNOSIS — R262 Difficulty in walking, not elsewhere classified: Secondary | ICD-10-CM | POA: Diagnosis not present

## 2020-08-22 DIAGNOSIS — Z471 Aftercare following joint replacement surgery: Secondary | ICD-10-CM | POA: Diagnosis not present

## 2020-08-26 DIAGNOSIS — Z471 Aftercare following joint replacement surgery: Secondary | ICD-10-CM | POA: Diagnosis not present

## 2020-08-26 DIAGNOSIS — R262 Difficulty in walking, not elsewhere classified: Secondary | ICD-10-CM | POA: Diagnosis not present

## 2020-08-26 DIAGNOSIS — Z9889 Other specified postprocedural states: Secondary | ICD-10-CM | POA: Diagnosis not present

## 2020-08-26 DIAGNOSIS — R2689 Other abnormalities of gait and mobility: Secondary | ICD-10-CM | POA: Diagnosis not present

## 2020-09-02 DIAGNOSIS — Z471 Aftercare following joint replacement surgery: Secondary | ICD-10-CM | POA: Diagnosis not present

## 2020-09-02 DIAGNOSIS — R2689 Other abnormalities of gait and mobility: Secondary | ICD-10-CM | POA: Diagnosis not present

## 2020-09-02 DIAGNOSIS — Z9889 Other specified postprocedural states: Secondary | ICD-10-CM | POA: Diagnosis not present

## 2020-09-02 DIAGNOSIS — R262 Difficulty in walking, not elsewhere classified: Secondary | ICD-10-CM | POA: Diagnosis not present

## 2020-09-04 DIAGNOSIS — R262 Difficulty in walking, not elsewhere classified: Secondary | ICD-10-CM | POA: Diagnosis not present

## 2020-09-04 DIAGNOSIS — E1142 Type 2 diabetes mellitus with diabetic polyneuropathy: Secondary | ICD-10-CM | POA: Diagnosis not present

## 2020-09-04 DIAGNOSIS — R2689 Other abnormalities of gait and mobility: Secondary | ICD-10-CM | POA: Diagnosis not present

## 2020-09-04 DIAGNOSIS — E782 Mixed hyperlipidemia: Secondary | ICD-10-CM | POA: Diagnosis not present

## 2020-09-04 DIAGNOSIS — E1165 Type 2 diabetes mellitus with hyperglycemia: Secondary | ICD-10-CM | POA: Diagnosis not present

## 2020-09-04 DIAGNOSIS — Z9889 Other specified postprocedural states: Secondary | ICD-10-CM | POA: Diagnosis not present

## 2020-09-04 DIAGNOSIS — I10 Essential (primary) hypertension: Secondary | ICD-10-CM | POA: Diagnosis not present

## 2020-09-04 DIAGNOSIS — Z471 Aftercare following joint replacement surgery: Secondary | ICD-10-CM | POA: Diagnosis not present

## 2020-09-08 DIAGNOSIS — R2689 Other abnormalities of gait and mobility: Secondary | ICD-10-CM | POA: Diagnosis not present

## 2020-09-08 DIAGNOSIS — Z9889 Other specified postprocedural states: Secondary | ICD-10-CM | POA: Diagnosis not present

## 2020-09-08 DIAGNOSIS — Z471 Aftercare following joint replacement surgery: Secondary | ICD-10-CM | POA: Diagnosis not present

## 2020-09-08 DIAGNOSIS — R262 Difficulty in walking, not elsewhere classified: Secondary | ICD-10-CM | POA: Diagnosis not present

## 2020-09-10 ENCOUNTER — Other Ambulatory Visit: Payer: Self-pay | Admitting: Family Medicine

## 2020-09-10 DIAGNOSIS — Z471 Aftercare following joint replacement surgery: Secondary | ICD-10-CM | POA: Diagnosis not present

## 2020-09-10 DIAGNOSIS — Z9889 Other specified postprocedural states: Secondary | ICD-10-CM | POA: Diagnosis not present

## 2020-09-10 DIAGNOSIS — R262 Difficulty in walking, not elsewhere classified: Secondary | ICD-10-CM | POA: Diagnosis not present

## 2020-09-10 DIAGNOSIS — R2689 Other abnormalities of gait and mobility: Secondary | ICD-10-CM | POA: Diagnosis not present

## 2020-10-22 ENCOUNTER — Telehealth: Payer: Self-pay

## 2020-10-22 MED ORDER — LOSARTAN POTASSIUM 50 MG PO TABS: 100.0000 mg | ORAL_TABLET | Freq: Every day | ORAL | 0 refills | Status: DC

## 2020-10-22 NOTE — Telephone Encounter (Signed)
Pharmacy sent a fax stating that they have ran out of Losartan 100 mg and they only have the 50 mg tablets. They are requesting an updated order for this. Please advise.

## 2020-10-22 NOTE — Addendum Note (Signed)
Addended by: Ria Bush on: 10/22/2020 02:07 PM   Modules accepted: Orders

## 2020-12-11 DIAGNOSIS — I10 Essential (primary) hypertension: Secondary | ICD-10-CM | POA: Diagnosis not present

## 2020-12-11 DIAGNOSIS — E1142 Type 2 diabetes mellitus with diabetic polyneuropathy: Secondary | ICD-10-CM | POA: Diagnosis not present

## 2020-12-11 DIAGNOSIS — E782 Mixed hyperlipidemia: Secondary | ICD-10-CM | POA: Diagnosis not present

## 2020-12-11 DIAGNOSIS — E1165 Type 2 diabetes mellitus with hyperglycemia: Secondary | ICD-10-CM | POA: Diagnosis not present

## 2020-12-27 ENCOUNTER — Other Ambulatory Visit: Payer: Self-pay | Admitting: Family Medicine

## 2020-12-27 DIAGNOSIS — E785 Hyperlipidemia, unspecified: Secondary | ICD-10-CM

## 2020-12-27 DIAGNOSIS — E1169 Type 2 diabetes mellitus with other specified complication: Secondary | ICD-10-CM

## 2020-12-27 DIAGNOSIS — E1149 Type 2 diabetes mellitus with other diabetic neurological complication: Secondary | ICD-10-CM

## 2020-12-29 ENCOUNTER — Other Ambulatory Visit: Payer: Self-pay

## 2020-12-29 ENCOUNTER — Other Ambulatory Visit (INDEPENDENT_AMBULATORY_CARE_PROVIDER_SITE_OTHER): Payer: Medicare HMO

## 2020-12-29 DIAGNOSIS — E785 Hyperlipidemia, unspecified: Secondary | ICD-10-CM

## 2020-12-29 DIAGNOSIS — E1169 Type 2 diabetes mellitus with other specified complication: Secondary | ICD-10-CM | POA: Diagnosis not present

## 2020-12-29 LAB — COMPREHENSIVE METABOLIC PANEL
ALT: 14 U/L (ref 0–53)
AST: 17 U/L (ref 0–37)
Albumin: 4.2 g/dL (ref 3.5–5.2)
Alkaline Phosphatase: 52 U/L (ref 39–117)
BUN: 25 mg/dL — ABNORMAL HIGH (ref 6–23)
CO2: 24 mEq/L (ref 19–32)
Calcium: 9.1 mg/dL (ref 8.4–10.5)
Chloride: 104 mEq/L (ref 96–112)
Creatinine, Ser: 1.38 mg/dL (ref 0.40–1.50)
GFR: 46.67 mL/min — ABNORMAL LOW (ref >60.00)
Glucose, Bld: 111 mg/dL — ABNORMAL HIGH (ref 70–99)
Potassium: 4.5 mEq/L (ref 3.5–5.1)
Sodium: 137 mEq/L (ref 135–145)
Total Bilirubin: 0.6 mg/dL (ref 0.2–1.2)
Total Protein: 6.7 g/dL (ref 6.0–8.3)

## 2020-12-29 LAB — LIPID PANEL
Cholesterol: 152 mg/dL (ref 0–200)
HDL: 62 mg/dL (ref >39.00)
LDL Cholesterol: 79 mg/dL (ref 0–99)
NonHDL: 90.34
Total CHOL/HDL Ratio: 2
Triglycerides: 56 mg/dL (ref 0.0–149.0)
VLDL: 11.2 mg/dL (ref 0.0–40.0)

## 2020-12-30 ENCOUNTER — Other Ambulatory Visit: Payer: Self-pay

## 2020-12-30 ENCOUNTER — Ambulatory Visit (INDEPENDENT_AMBULATORY_CARE_PROVIDER_SITE_OTHER): Payer: Medicare HMO

## 2020-12-30 DIAGNOSIS — Z0000 Encounter for general adult medical examination without abnormal findings: Secondary | ICD-10-CM

## 2020-12-30 NOTE — Patient Instructions (Signed)
[name] , Thank you for taking time to come for your Medicare Wellness Visit. I appreciate your ongoing commitment to your health goals. Please review the following plan we discussed and let me know if I can assist you in the future.   Screening recommendations/referrals: Colonoscopy: no longer required  Recommended yearly ophthalmology/optometry visit for glaucoma screening and checkup Recommended yearly dental visit for hygiene and checkup  Vaccinations: Influenza vaccine: Up to date, completed 05/21/2020, due 03/2021 Pneumococcal vaccine: Completed series Tdap vaccine: Up to date, completed 01/15/2011, due 01/2021 Shingles vaccine: due, check with your insurance regarding coverage if interested    Covid-19: Completed series, Please bring card to physical so we can document in chart  Advanced directives: Please bring a copy of your POA (Power of [place]) and/or Living Will to your next appointment.   Conditions/risks identified: diabetes, hyperlipidemia   Next appointment: Follow up in one year for your annual wellness visit.  Preventive Care 2 Years and Older, Male Preventive care refers to lifestyle choices and visits with your health care provider that can promote health and wellness. What does preventive care include?  A yearly physical exam. This is also called an annual well check.  Dental exams once or twice a year.  Routine eye exams. Ask your health care provider how often you should have your eyes checked.  Personal lifestyle choices, including:  Daily care of your teeth and gums.  Regular physical activity.  Eating a healthy diet.  Avoiding tobacco and drug use.  Limiting alcohol use.  Practicing safe sex.  Taking low doses of aspirin every day.  Taking vitamin and mineral supplements as recommended by your health care provider. What happens during an annual well check? The services and screenings done by your health care provider during your annual  well check will depend on your age, overall health, lifestyle risk factors, and family history of disease. Counseling  Your health care provider may ask you questions about your:  Alcohol use.  Tobacco use.  Drug use.  Emotional well-being.  Home and relationship well-being.  Sexual activity.  Eating habits.  History of falls.  Memory and ability to understand (cognition).  Work and work Statistician. Screening  You may have the following tests or measurements:  Height, weight, and BMI.  Blood pressure.  Lipid and cholesterol levels. These may be checked every 5 years, or more frequently if you are over 79 years old.  Skin check.  Lung cancer screening. You may have this screening every year starting at age 23 if you have a 30-pack-year history of smoking and currently smoke or have quit within the past 15 years.  Fecal occult blood test (FOBT) of the stool. You may have this test every year starting at age 63.  Flexible sigmoidoscopy or colonoscopy. You may have a sigmoidoscopy every 5 years or a colonoscopy every 10 years starting at age 29.  Prostate cancer screening. Recommendations will vary depending on your family history and other risks.  Hepatitis C blood test.  Hepatitis B blood test.  Sexually transmitted disease (STD) testing.  Diabetes screening. This is done by checking your blood sugar (glucose) after you have not eaten for a while (fasting). You may have this done every 1-3 years.  Abdominal aortic aneurysm (AAA) screening. You may need this if you are a current or former smoker.  Osteoporosis. You may be screened starting at age 8 if you are at high risk. Talk with your health care provider about your test  results, treatment options, and if necessary, the need for more tests. Vaccines  Your health care provider may recommend certain vaccines, such as:  Influenza vaccine. This is recommended every year.  Tetanus, diphtheria, and acellular  pertussis (Tdap, Td) vaccine. You may need a Td booster every 10 years.  Zoster vaccine. You may need this after age 43.  Pneumococcal 13-valent conjugate (PCV13) vaccine. One dose is recommended after age 75.  Pneumococcal polysaccharide (PPSV23) vaccine. One dose is recommended after age 62. Talk to your health care provider about which screenings and vaccines you need and how often you need them. This information is not intended to replace advice given to you by your health care provider. Make sure you discuss any questions you have with your health care provider. Document Released: 08/29/2015 Document Revised: 04/21/2016 Document Reviewed: 06/03/2015 Elsevier Interactive Patient Education  2017 [employer] Prevention in the Home Falls can cause injuries. They can happen to people of all ages. There are many things you can do to make your home safe and to help prevent falls. What can I do on the outside of my home?  Regularly fix the edges of walkways and driveways and fix any cracks.  Remove anything that might make you trip as you walk through a door, such as a raised step or threshold.  Trim any bushes or trees on the path to your home.  Use bright outdoor lighting.  Clear any walking paths of anything that might make someone trip, such as rocks or tools.  Regularly check to see if handrails are loose or broken. Make sure that both sides of any steps have handrails.  Any raised decks and porches should have guardrails on the edges.  Have any leaves, snow, or ice cleared regularly.  Use sand or salt on walking paths during winter.  Clean up any spills in your garage right away. This includes oil or grease spills. What can I do in the bathroom?  Use night lights.  Install grab bars by the toilet and in the tub and shower. Do not use towel bars as grab bars.  Use non-skid mats or decals in the tub or shower.  If you need to sit down in the shower, use a plastic,  non-slip stool.  Keep the floor dry. Clean up any water that spills on the floor as soon as it happens.  Remove soap buildup in the tub or shower regularly.  Attach bath mats securely with double-sided non-slip rug tape.  Do not have throw rugs and other things on the floor that can make you trip. What can I do in the bedroom?  Use night lights.  Make sure that you have a light by your bed that is easy to reach.  Do not use any sheets or blankets that are too big for your bed. They should not hang down onto the floor.  Have a firm chair that has side arms. You can use this for support while you get dressed.  Do not have throw rugs and other things on the floor that can make you trip. What can I do in the kitchen?  Clean up any spills right away.  Avoid walking on wet floors.  Keep items that you use a lot in easy-to-reach places.  If you need to reach something above you, use a strong step stool that has a grab bar.  Keep electrical cords out of the way.  Do not use floor polish or wax that makes  floors slippery. If you must use wax, use non-skid floor wax.  Do not have throw rugs and other things on the floor that can make you trip. What can I do with my stairs?  Do not leave any items on the stairs.  Make sure that there are handrails on both sides of the stairs and use them. Fix handrails that are broken or loose. Make sure that handrails are as long as the stairways.  Check any carpeting to make sure that it is firmly attached to the stairs. Fix any carpet that is loose or worn.  Avoid having throw rugs at the top or bottom of the stairs. If you do have throw rugs, attach them to the floor with carpet tape.  Make sure that you have a light switch at the top of the stairs and the bottom of the stairs. If you do not have them, ask someone to add them for you. What else can I do to help prevent falls?  Wear shoes that:  Do not have high heels.  Have rubber  bottoms.  Are comfortable and fit you well.  Are closed at the toe. Do not wear sandals.  If you use a stepladder:  Make sure that it is fully opened. Do not climb a closed stepladder.  Make sure that both sides of the stepladder are locked into place.  Ask someone to hold it for you, if possible.  Clearly mark and make sure that you can see:  Any grab bars or handrails.  First and last steps.  Where the edge of each step is.  Use tools that help you move around (mobility aids) if they are needed. These include:  Canes.  Walkers.  Scooters.  Crutches.  Turn on the lights when you go into a dark area. Replace any light bulbs as soon as they burn out.  Set up your furniture so you have a clear path. Avoid moving your furniture around.  If any of your floors are uneven, fix them.  If there are any pets around you, be aware of where they are.  Review your medicines with your doctor. Some medicines can make you feel dizzy. This can increase your chance of falling. Ask your doctor what other things that you can do to help prevent falls. This information is not intended to replace advice given to you by your health care provider. Make sure you discuss any questions you have with your health care provider. Document Released: 05/29/2009 Document Revised: 01/08/2016 Document Reviewed: 09/06/2014 Elsevier Interactive Patient Education  2017 [employer].

## 2020-12-30 NOTE — Progress Notes (Signed)
PCP notes:  Health Maintenance: No gaps noted    Abnormal Screenings: none   Patient concerns: Left hand numbness/tingling   Nurse concerns: none   Next PCP appt.: 01/02/2021 @ 11 am

## 2020-12-30 NOTE — Progress Notes (Signed)
Subjective:   John Sanford is a 87 y.o. male who presents for Medicare Annual/Subsequent preventive examination.  Review of Systems: N/A      I connected with the patient today by telephone and verified that I am speaking with the correct person using two identifiers. Location patient: home Location nurse: work Persons participating in the telephone visit: patient, nurse.   I discussed the limitations, risks, security and privacy concerns of performing an evaluation and management service by telephone and the availability of in person appointments. I also discussed with the patient that there may be a patient responsible charge related to this service. The patient expressed understanding and verbally consented to this telephonic visit.        Cardiac Risk Factors include: advanced age (>67mn, >>74women);male gender;diabetes mellitus;Other (see comment), Risk factor comments: hyperlipidemia     Objective:    Today's Vitals   There is no height or weight on file to calculate BMI.  Advanced Directives 12/30/2020 08/08/2020 07/30/2020 07/29/2020 07/25/2020 05/26/2020 04/03/2020  Does Patient Have a Medical Advance Directive? Yes Yes - No Yes Yes No;Yes  Type of [place]aramed[place]of [place] will Living will - - [place] will - [place] will  Does patient want to make changes to medical advance directive? - - - - - No - Patient declined -  Copy of [place] Chart? No - copy requested - - - No - copy requested - -  Would patient like information on creating a medical advance directive? - - No - Patient declined - - - -    Current Medications (verified) Outpatient Encounter Medications as of 12/30/2020  Medication Sig  . amLODipine (NORVASC) 5 MG tablet TAKE 1 TABLET BY MOUTH EVERY DAY  . aspirin 81 MG tablet Take 1 tablet (81 mg total) by mouth 2 (two) times daily after a meal.   . Blood Glucose Monitoring Suppl (ONE TOUCH ULTRA 2) w/Device KIT Use as instructed to check blood sugar once daily.  .Marland Kitchendocusate sodium (COLACE) 100 MG capsule Take 100 mg by mouth daily. OTC  . gabapentin (NEURONTIN) 300 MG capsule Take 300 mg by mouth at bedtime.  .Marland KitchenglipiZIDE (GLUCOTROL XL) 5 MG 24 hr tablet Take by mouth.  .Marland Kitchenglucose blood test strip Use as instructed to check blood sugar once daily.  . Lancets (ONETOUCH DELICA PLUS [id number] MISC Use as instructed to check blood sugar once daily.  .Marland Kitchenlosartan (COZAAR) 100 MG tablet TAKE 1 TABLET BY MOUTH EVERY DAY  . losartan (COZAAR) 50 MG tablet Take 2 tablets (100 mg total) by mouth daily.  . metFORMIN (GLUCOPHAGE) 1000 MG tablet Take 1 tablet (1,000 mg total) by mouth 2 (two) times daily with a meal.  . pravastatin (PRAVACHOL) 80 MG tablet TAKE 1 TABLET BY MOUTH EVERY DAY  . tiZANidine (ZANAFLEX) 2 MG tablet Take 1 tablet (2 mg total) by mouth every 6 (six) hours as needed for muscle spasms.  . traMADol (ULTRAM) 50 MG tablet Take 1 tablet (50 mg total) by mouth every 6 (six) hours as needed for moderate pain or severe pain (post op pain).  . TRULICITY 07.61MYW/7.3XTSOPN Inject 0.75 mg into the skin every Tuesday.  (Patient not taking: Reported on 12/30/2020)   No facility-administered encounter medications on file as of 12/30/2020.    Allergies (verified) Patient has no known allergies.   History: Past Medical History:  Diagnosis Date  .  Allergy   . Arthritis   . BPH (benign prostatic hypertrophy)   . Cancer (HCC)    hx of skin cancer   . Diabetes mellitus    type 2   . Hyperlipidemia   . Hypertension   . Pneumonia    hx of    Past Surgical History:  Procedure Laterality Date  . CATARACT EXTRACTION W/ INTRAOCULAR LENS IMPLANT    . COLONOSCOPY    . PROSTATE BIOPSY     negative  . TONSILLECTOMY AND ADENOIDECTOMY    . TOTAL HIP ARTHROPLASTY Left 07/29/2020   Procedure: LEFT TOTAL HIP ARTHROPLASTY ANTERIOR APPROACH;   Surgeon: Melrose Nakayama, MD;  Location: [facility];  Service: Orthopedics;  Laterality: Left;  Marland Kitchen VENTRAL HERNIA REPAIR     periumbilical   Family History  Problem Relation Age of Onset  . Heart disease Mother   . Diabetes Father   . Heart disease Father   . Diabetes Brother   . Heart disease Brother   . Pancreatic cancer Brother    Social History   Socioeconomic History  . Marital status: Widowed    Spouse name: Not on file  . Number of children: 3  . Years of education: 60  . Highest education level: Not on file  Occupational History  . Occupation: retired Nurse, mental health  . Smoking status: Former Smoker    Packs/day: 2.00    Years: 20.00    Pack years: 40.00    Types: Cigarettes    Quit date: 08/16/1972    Years since quitting: 48.4  . Smokeless tobacco: Never Used  Vaping Use  . Vaping Use: Never used  Substance and Sexual Activity  . Alcohol use: No    Comment: rare  . Drug use: No  . Sexual activity: Not on file  Other Topics Concern  . Not on file  Social History Narrative   Has living will.    Has health care POA---daughter then son   Would accept resuscitation--but no prolonged artificial ventilation   Would not want feeding tube if cognitively unaware   Social Determinants of Health   Financial Resource Strain: Low Risk   . Difficulty of Paying Living Expenses: Not hard at all  Food Insecurity: No Food Insecurity  . Worried About Charity fundraiser in the Last Year: Never true  . Ran Out of Food in the Last Year: Never true  Transportation Needs: No Transportation Needs  . Lack of Transportation (Medical): No  . Lack of Transportation (Non-Medical): No  Physical Activity: Inactive  . Days of Exercise per Week: 0 days  . Minutes of Exercise per Session: 0 min  Stress: No Stress Concern Present  . Feeling of Stress : Not at all  Social Connections: Not on file    Tobacco Counseling Counseling given: Not Answered   Clinical  Intake:  Pre-visit preparation completed: Yes  Pain : No/denies pain     Nutritional Risks: None Diabetes: Yes CBG done?: No Did pt. bring in CBG monitor from home?: No  How often do you need to have someone help you when you read instructions, pamphlets, or other written materials from your doctor or pharmacy?: 1 - Never  Diabetic: Yes Nutrition Risk Assessment:  Has the patient had any N/V/D within the last 2 months?  No  Does the patient have any non-healing wounds?  No  Has the patient had any unintentional weight loss or weight gain?  No   Diabetes:  Is the patient diabetic?  Yes  If diabetic, was a CBG obtained today?  No  telephone visit  Did the patient bring in their glucometer from home?  No  telephone visit  How often do you monitor your CBG's? daily.   Financial Strains and Diabetes Management:  Are you having any financial strains with the device, your supplies or your medication? No .  Does the patient want to be seen by Chronic Care Management for management of their diabetes?  No  Would the patient like to be referred to a Nutritionist or for Diabetic Management?  No   Diabetic Exams:  Diabetic Eye Exam: Completed 04/28/2020 Diabetic Foot Exam: Completed 05/21/2020   Interpreter Needed?: No  Information entered by :: CJohnson, LPN   Activities of Daily Living In your present state of health, do you have any difficulty performing the following activities: 12/30/2020 07/29/2020  Hearing? N Y  Vision? N N  Difficulty concentrating or making decisions? N N  Walking or climbing stairs? N Y  Dressing or bathing? N N  Doing errands, shopping? N N  Preparing Food and eating ? N -  Using the Toilet? N -  In the past six months, have you accidently leaked urine? N -  Do you have problems with loss of bowel control? N -  Managing your Medications? N -  Managing your Finances? N -  Housekeeping or managing your Housekeeping? N -  Some recent data might be  hidden    Patient Care Team: Ria Bush, MD as PCP - General (Family Medicine)  Indicate any recent Medical Services you may have received from other than [facility] providers in the past year (date may be approximate).     Assessment:   This is a routine wellness examination for [place].  Hearing/Vision screen  Hearing Screening   _0  _1  _2  _3  _4  _5  _6  _7  _8   Right ear:           Left ear:           Vision Screening Comments: Patient gets annual eye exams   Dietary issues and exercise activities discussed: Current Exercise Habits: The patient does not participate in regular exercise at present, Exercise limited by: None identified  Goals Addressed            This Visit's Progress   . Patient Stated       12/30/2020, I will maintain and continue medications as prescribed.       Depression Screen PHQ 2/9 Scores 12/30/2020 05/26/2020 04/25/2019 04/25/2019 11/15/2017 11/03/2016 10/21/2015  PHQ - 2 Score 0 0 0 0 0 0 0  PHQ- 9 Score 0 - - 0 - - -    Fall Risk Fall Risk  12/30/2020 05/26/2020 04/25/2019 04/25/2019 11/15/2017  Falls in the past year? 0 0 0 0 No  Number falls in past yr: 0 - - - -  Injury with Fall? 0 - - - -  Risk for fall due to : Medication side effect - - - -  Follow up Falls evaluation completed;Falls prevention discussed - - - -    FALL RISK PREVENTION PERTAINING TO THE HOME:  Any stairs in or around the home? Yes  If so, are there any without handrails? No  Home free of loose throw rugs in walkways, pet beds, electrical cords, etc? Yes  Adequate lighting in your home to reduce risk of falls? Yes   ASSISTIVE DEVICES UTILIZED TO PREVENT FALLS:  Life alert? No  Use of a cane, walker or w/c? No  Grab bars in the bathroom? No  Shower chair or bench in shower? No  Elevated toilet seat or a handicapped toilet? No   TIMED UP AND GO:  Was the test performed? N/A telephone visit .   Cognitive Function: MMSE - Mini Mental State  Exam 12/30/2020  Not completed: Refused       Mini Cog  Mini-Cog screen was not completed. Patient refused. Maximum score is 22. A value of 0 denotes this part of the MMSE was not completed or the patient failed this part of the Mini-Cog screening.  Immunizations Immunization History  Administered Date(s) Administered  . Fluad Quad(high Dose 65+) 04/25/2019, 05/21/2020  . Influenza Split 07/27/2011  . Influenza Whole 07/26/2007, 06/13/2008, 07/25/2009, 07/24/2010  . Influenza, Seasonal, Injecte, Preservative Fre 08/30/2012  . Influenza,inj,Quad PF,6+ Mos 05/28/2013, 09/20/2014, 04/21/2016, 05/10/2017, 05/17/2018  . PFIZER(Purple Top)SARS-COV-2 Vaccination 09/11/2019, 10/02/2019  . Pneumococcal Conjugate-13 03/12/2014  . Pneumococcal Polysaccharide-23 07/24/2010, 11/15/2017  . Td 02/13/2006, 01/15/2011  . Zoster 09/24/2013    TDAP status: Up to date  Flu Vaccine status: Up to date  Pneumococcal vaccine status: Up to date  Covid-19 vaccine status: Completed vaccines, Advised patient to bring card to physical so we can document in chart  Qualifies for Shingles Vaccine? Yes   Zostavax completed Yes   Shingrix Completed?: No.    Education has been provided regarding the importance of this vaccine. Patient has been advised to call insurance company to determine out of pocket expense if they have not yet received this vaccine. Advised may also receive vaccine at local pharmacy or Health Dept. Verbalized acceptance and understanding.  Screening Tests Health Maintenance  Topic Date Due  . COVID-19 Vaccine (3 - Pfizer risk 4-dose series) 10/30/2019  . HEMOGLOBIN A1C  12/30/2020  . TETANUS/TDAP  01/14/2021  . INFLUENZA VACCINE  03/16/2021  . OPHTHALMOLOGY EXAM  04/28/2021  . FOOT EXAM  05/21/2021  . PNA vac Low Risk Adult  Completed  . HPV VACCINES  Aged Out    Health Maintenance  Health Maintenance Due  Topic Date Due  . COVID-19 Vaccine (3 - Pfizer risk 4-dose series)  10/30/2019  . HEMOGLOBIN A1C  12/30/2020    Colorectal cancer screening: No longer required.   Lung Cancer Screening: (Low Dose CT Chest recommended if Age 60-80 years, 30 pack-year currently smoking OR have quit w/in 15years.) does not qualify.    Additional Screening:  Hepatitis C Screening: does not qualify; Completed N/A  Vision Screening: Recommended annual ophthalmology exams for early detection of glaucoma and other disorders of the eye. Is the patient up to date with their annual eye exam?  Yes  Who is the provider or what is the name of the office in which the patient attends annual eye exams? Dr. Einar Gip If pt is not established with a provider, would they like to be referred to a provider to establish care? No .   Dental Screening: Recommended annual dental exams for proper oral hygiene  Community Resource Referral / Chronic Care Management: CRR required this visit?  No   CCM required this visit?  No      Plan:     I have personally reviewed and noted the following in the patient's chart:   . Medical and social history . Use of alcohol, tobacco or illicit drugs  . Current medications and supplements including opioid prescriptions. Patient is not currently taking opioid prescriptions. . Functional ability and status .  Nutritional status . Physical activity . Advanced directives . List of other physicians . Hospitalizations, surgeries, and ER visits in previous 12 months . Vitals . Screenings to include cognitive, depression, and falls . Referrals and appointments  In addition, I have reviewed and discussed with patient certain preventive protocols, quality metrics, and best practice recommendations. A written personalized care plan for preventive services as well as general preventive health recommendations were provided to patient.   Due to this being a telephonic visit, the after visit summary with patients personalized plan was offered to patient via  office or my-chart. Patient preferred to pick up at office at next visit or via mychart.   Andrez Grime, LPN   10/05/2540

## 2021-01-02 ENCOUNTER — Other Ambulatory Visit: Payer: Self-pay

## 2021-01-02 ENCOUNTER — Ambulatory Visit (INDEPENDENT_AMBULATORY_CARE_PROVIDER_SITE_OTHER): Payer: Medicare HMO | Admitting: Family Medicine

## 2021-01-02 ENCOUNTER — Encounter: Payer: Self-pay | Admitting: Family Medicine

## 2021-01-02 VITALS — BP 134/60 | HR 83 | Temp 97.6°F | Ht 66.5 in | Wt 158.3 lb

## 2021-01-02 DIAGNOSIS — Z96642 Presence of left artificial hip joint: Secondary | ICD-10-CM

## 2021-01-02 DIAGNOSIS — E1149 Type 2 diabetes mellitus with other diabetic neurological complication: Secondary | ICD-10-CM | POA: Diagnosis not present

## 2021-01-02 DIAGNOSIS — Z0000 Encounter for general adult medical examination without abnormal findings: Secondary | ICD-10-CM | POA: Diagnosis not present

## 2021-01-02 DIAGNOSIS — Z7189 Other specified counseling: Secondary | ICD-10-CM | POA: Diagnosis not present

## 2021-01-02 DIAGNOSIS — K5900 Constipation, unspecified: Secondary | ICD-10-CM | POA: Diagnosis not present

## 2021-01-02 DIAGNOSIS — E1169 Type 2 diabetes mellitus with other specified complication: Secondary | ICD-10-CM | POA: Diagnosis not present

## 2021-01-02 DIAGNOSIS — I10 Essential (primary) hypertension: Secondary | ICD-10-CM

## 2021-01-02 DIAGNOSIS — E785 Hyperlipidemia, unspecified: Secondary | ICD-10-CM | POA: Diagnosis not present

## 2021-01-02 MED ORDER — PRAVASTATIN SODIUM 80 MG PO TABS: 80.0000 mg | ORAL_TABLET | Freq: Every day | ORAL | 3 refills | Status: DC

## 2021-01-02 MED ORDER — LOSARTAN POTASSIUM 50 MG PO TABS: 50.0000 mg | ORAL_TABLET | Freq: Every day | ORAL | 3 refills | Status: DC

## 2021-01-02 MED ORDER — AMLODIPINE BESYLATE 5 MG PO TABS: 1.0000 | ORAL_TABLET | Freq: Every day | ORAL | 3 refills | Status: DC

## 2021-01-02 NOTE — Assessment & Plan Note (Signed)
Chronic, stable on pravastatin - continue. The ASCVD Risk score Mikey Bussing DC Jr., et al., 2013) failed to calculate for the following reasons:   The 2013 ASCVD risk score is only valid for ages 101 to 87

## 2021-01-02 NOTE — Assessment & Plan Note (Signed)
From prior PCP note: Advanced directive discussion - Has living will scanned and in chart 10/2016 - will rescan to place in [place]. Has health care POA---daughter then son. Would accept resuscitation--but no prolonged artificial ventilation. Would not want feeding tube if cognitively unaware.

## 2021-01-02 NOTE — Assessment & Plan Note (Signed)
Preventative protocols reviewed and updated unless pt declined. Discussed healthy diet and lifestyle.  

## 2021-01-02 NOTE — Patient Instructions (Addendum)
Recheck height Call us with date of booster COVID shot to update your chart.  If interested, check with pharmacy about new 2 shot shingles series (shingrix).  Call to schedule eye exam - recommend yearly diabetic eye exam.  Return as needed or in 1 year for next physical/wellness visit   Health Maintenance After Age 85 After age 9, you are at a higher risk for certain long-term diseases and infections as well as injuries from falls. Falls are a major cause of broken bones and head injuries in people who are older than age 75. Getting regular preventive care can help to keep you healthy and well. Preventive care includes getting regular testing and making lifestyle changes as recommended by your health care provider. Talk with your health care provider about:  Which screenings and tests you should have. A screening is a test that checks for a disease when you have no symptoms.  A diet and exercise plan that is right for you. What should I know about screenings and tests to prevent falls? Screening and testing are the best ways to find a health problem early. Early diagnosis and treatment give you the best chance of managing medical conditions that are common after age 80. Certain conditions and lifestyle choices may make you more likely to have a fall. Your health care provider may recommend:  Regular vision checks. Poor vision and conditions such as cataracts can make you more likely to have a fall. If you wear glasses, make sure to get your prescription updated if your vision changes.  Medicine review. Work with your health care provider to regularly review all of the medicines you are taking, including over-the-counter medicines. Ask your health care provider about any side effects that may make you more likely to have a fall. Tell your health care provider if any medicines that you take make you feel dizzy or sleepy.  Osteoporosis screening. Osteoporosis is a condition that causes the bones to  get weaker. This can make the bones weak and cause them to break more easily.  Blood pressure screening. Blood pressure changes and medicines to control blood pressure can make you feel dizzy.  Strength and balance checks. Your health care provider may recommend certain tests to check your strength and balance while standing, walking, or changing positions.  Foot health exam. Foot pain and numbness, as well as not wearing proper footwear, can make you more likely to have a fall.  Depression screening. You may be more likely to have a fall if you have a fear of falling, feel emotionally low, or feel unable to do activities that you used to do.  Alcohol use screening. Using too much alcohol can affect your balance and may make you more likely to have a fall. What actions can I take to lower my risk of falls? General instructions  Talk with your health care provider about your risks for falling. Tell your health care provider if: ? You fall. Be sure to tell your health care provider about all falls, even ones that seem minor. ? You feel dizzy, sleepy, or off-balance.  Take over-the-counter and prescription medicines only as told by your health care provider. These include any supplements.  Eat a healthy diet and maintain a healthy weight. A healthy diet includes low-fat dairy products, low-fat (lean) meats, and fiber from whole grains, beans, and lots of fruits and vegetables. Home safety  Remove any tripping hazards, such as rugs, cords, and clutter.  Install safety equipment such as  grab bars in bathrooms and safety rails on stairs.  Keep rooms and walkways well-lit. Activity  Follow a regular exercise program to stay fit. This will help you maintain your balance. Ask your health care provider what types of exercise are appropriate for you.  If you need a cane or walker, use it as recommended by your health care provider.  Wear supportive shoes that have nonskid soles.    Lifestyle  Do not drink alcohol if your health care provider tells you not to drink.  If you drink alcohol, limit how much you have: ? 0-1 drink a day for women. ? 0-2 drinks a day for men.  Be aware of how much alcohol is in your drink. In the [place] one drink equals one typical bottle of beer (12 oz), one-half glass of wine (5 oz), or one shot of hard liquor (1 oz).  Do not use any products that contain nicotine or tobacco, such as cigarettes and e-cigarettes. If you need help quitting, ask your health care provider. Summary  Having a healthy lifestyle and getting preventive care can help to protect your health and wellness after age 12.  Screening and testing are the best way to find a health problem early and help you avoid having a fall. Early diagnosis and treatment give you the best chance for managing medical conditions that are more common for people who are older than age 55.  Falls are a major cause of broken bones and head injuries in people who are older than age 28. Take precautions to prevent a fall at home.  Work with your health care provider to learn what changes you can make to improve your health and wellness and to prevent falls. This information is not intended to replace advice given to you by your health care provider. Make sure you discuss any questions you have with your health care provider. Document Revised: 11/23/2018 Document Reviewed: 06/15/2017 Elsevier Patient Education  2021 [employer].

## 2021-01-02 NOTE — Assessment & Plan Note (Signed)
Notes this is better off trulicity

## 2021-01-02 NOTE — Progress Notes (Addendum)
Patient ID: John Sanford, male    DOB: Jan 12, 1933, 87 y.o.   MRN: [id number]  This visit was conducted in person.  BP 134/60   Pulse 83   Temp 97.6 F (36.4 C) (Temporal)   Ht 5' 6.5" (1.689 m)   Wt 158 lb 5 oz (71.8 kg)   SpO2 97%   BMI 25.17 kg/m    CC: CPE after AMW Subjective:   HPI: John Sanford is a 87 y.o. male presenting on 01/02/2021 for Annual Exam (Prt 2. )   Saw health advisor last week for medicare wellness visit. Note reviewed.    No exam data present  Flowsheet Row Clinical Support from 12/30/2020 in [place] [facility]  PHQ-2 Total Score 0      Fall Risk  12/30/2020 05/26/2020 04/25/2019 04/25/2019 11/15/2017  Falls in the past year? 0 0 0 0 No  Number falls in past yr: 0 - - - -  Injury with Fall? 0 - - - -  Risk for fall due to : Medication side effect - - - -  Follow up Falls evaluation completed;Falls prevention discussed - - - -   Has recovered well from L hip replacement surgery.   Continues seeing [facility] endocrinology for diabetes - last seen 2020-12-01, note reviewed - continues metformin 516m 2 tab bid and trulicity was switched to jardiance 28mdaily - unaffordable so started on glipizide XL 51m16maily. Trulicity caused indigestion and bad constipation and weight loss. Brings sugar log - overall stable readings. Recent A1c through endo 6.5%   He's been taking losartan 63m22mce daily with good readings (118/59).   Preventative: Colon cancer screening - colonoscopy 2009 TA ([place] Prostate cancer screening - aged out Lung cancer screening - aged out [place], 09/[place], booster x1 pfizer ~09/2020 Td 2012 Pneumovax23 2011, 2019 prevnar13 2015 zostavax 2015  Shingrix - discussed  Advanced directive discussion - Has living will scanned and in chart 10/2016. Has health care POA---daughter then son. Would accept resuscitation--but no prolonged artificial ventilation. Would not want  feeding tube if cognitively unaware.  Seat belt use discussed Sunscreen use discussed. No changing moles on skin. Has seen derm.  Ex smoker - quit remotely Alcohol - none Dentist - full dentures  Eye exam - yearly ([place])  Widower - wife deceased 4/20April 18, [place]ughter lives nearby, [place]  Relevant past medical, surgical, family and social history reviewed and updated as indicated. Interim medical history since our last visit reviewed. Allergies and medications reviewed and updated. Outpatient Medications Prior to Visit  Medication Sig Dispense Refill  . aspirin 81 MG tablet Take 1 tablet (81 mg total) by mouth 2 (two) times daily after a meal. 60 tablet 0  . Blood Glucose Monitoring Suppl (ONE TOUCH ULTRA 2) w/Device KIT Use as instructed to check blood sugar once daily. 1 kit 0  . docusate sodium (COLACE) 100 MG capsule Take 100 mg by mouth daily. OTC    . gabapentin (NEURONTIN) 300 MG capsule Take 300 mg by mouth at bedtime.    . glMarland KitchenpiZIDE (GLUCOTROL XL) 5 MG 24 hr tablet Take by mouth.    . glMarland Kitchencose blood test strip Use as instructed to check blood sugar once daily. 100 each 3  . Lancets (ONETOUCH DELICA PLUS [id number] Use as instructed to check blood sugar once daily. 100 each 3  .  metFORMIN (GLUCOPHAGE) 500 MG tablet SMARTSIG:2 Tablet(s) By Mouth Morning-Evening    . amLODipine (NORVASC) 5 MG tablet TAKE 1 TABLET BY MOUTH EVERY DAY 90 tablet 1  . losartan (COZAAR) 50 MG tablet Take 2 tablets (100 mg total) by mouth daily. (Patient taking differently: Take 100 mg by mouth daily. Takes 1 tablet daily) 180 tablet 0  . pravastatin (PRAVACHOL) 80 MG tablet TAKE 1 TABLET BY MOUTH EVERY DAY 90 tablet 1  . losartan (COZAAR) 100 MG tablet TAKE 1 TABLET BY MOUTH EVERY DAY 90 tablet 3  . metFORMIN (GLUCOPHAGE) 1000 MG tablet Take 1 tablet (1,000 mg total) by mouth 2 (two) times daily with a meal. 180 tablet 3  . tiZANidine (ZANAFLEX) 2 MG tablet Take 1  tablet (2 mg total) by mouth every 6 (six) hours as needed for muscle spasms. 40 tablet 1  . traMADol (ULTRAM) 50 MG tablet Take 1 tablet (50 mg total) by mouth every 6 (six) hours as needed for moderate pain or severe pain (post op pain). 40 tablet 0  . TRULICITY 9.79 YI/0.1KP SOPN Inject 0.75 mg into the skin every Tuesday.  (Patient not taking: Reported on 12/30/2020)     No facility-administered medications prior to visit.     Per HPI unless specifically indicated in ROS section below Review of Systems  Constitutional: Negative for activity change, appetite change, chills, fatigue, fever and unexpected weight change.  HENT: Negative for hearing loss.   Eyes: Negative for visual disturbance.  Respiratory: Negative for cough, chest tightness, shortness of breath and wheezing.   Cardiovascular: Negative for chest pain, palpitations and leg swelling.  Gastrointestinal: Negative for abdominal distention, abdominal pain, blood in stool, constipation, diarrhea, nausea and vomiting.  Genitourinary: Negative for difficulty urinating and hematuria.  Musculoskeletal: Negative for arthralgias, myalgias and neck pain.  Skin: Negative for rash.  Neurological: Negative for dizziness, seizures, syncope and headaches.  Hematological: Negative for adenopathy. Does not bruise/bleed easily.  Psychiatric/Behavioral: Negative for dysphoric mood. The patient is not nervous/anxious.    Objective:  BP 134/60   Pulse 83   Temp 97.6 F (36.4 C) (Temporal)   Ht 5' 6.5" (1.689 m)   Wt 158 lb 5 oz (71.8 kg)   SpO2 97%   BMI 25.17 kg/m   Wt Readings from Last 3 Encounters:  01/02/21 158 lb 5 oz (71.8 kg)  07/30/20 154 lb 15.7 oz (70.3 kg)  07/25/20 161 lb 4 oz (73.1 kg)    Ht Readings from Last 3 Encounters:  01/02/21 5' 6.5" (1.689 m)  07/30/20 _0  (1.702 m)  07/25/20 _1  (1.702 m)      Physical Exam Vitals and nursing note reviewed.  Constitutional:      General: He is not in acute  distress.    Appearance: Normal appearance. He is well-developed. He is not ill-appearing.  HENT:     Head: Normocephalic and atraumatic.     Right Ear: Hearing, tympanic membrane, ear canal and external ear normal.     Left Ear: Hearing, tympanic membrane, ear canal and external ear normal.     Mouth/Throat:     Pharynx: Uvula midline.  Eyes:     General: No scleral icterus.    Extraocular Movements: Extraocular movements intact.     Conjunctiva/sclera: Conjunctivae normal.     Pupils: Pupils are equal, round, and reactive to light.  Neck:     Thyroid: No thyroid mass or thyromegaly.     Vascular: No carotid bruit.  Cardiovascular:     Rate and Rhythm: Normal rate and regular rhythm.     Pulses: Normal pulses.          Radial pulses are 2+ on the right side and 2+ on the left side.     Heart sounds: Normal heart sounds. No murmur heard.   Pulmonary:     Effort: Pulmonary effort is normal. No respiratory distress.     Breath sounds: Normal breath sounds. No wheezing, rhonchi or rales.  Abdominal:     General: Bowel sounds are normal. There is no distension.     Palpations: Abdomen is soft. There is no mass.     Tenderness: There is no abdominal tenderness. There is no guarding or rebound.     Hernia: No hernia is present.  Musculoskeletal:        General: Normal range of motion.     Cervical back: Normal range of motion and neck supple.     Right lower leg: No edema.     Left lower leg: No edema.  Lymphadenopathy:     Cervical: No cervical adenopathy.  Skin:    General: Skin is warm and dry.     Findings: No rash.  Neurological:     General: No focal deficit present.     Mental Status: He is alert and oriented to person, place, and time.     Comments: CN grossly intact, station and gait intact  Psychiatric:        Mood and Affect: Mood normal.        Behavior: Behavior normal.        Thought Content: Thought content normal.        Judgment: Judgment normal.        Results for orders placed or performed in visit on 12/29/20  Lipid panel  Result Value Ref Range   Cholesterol 152 0 - 200 mg/dL   Triglycerides 56.0 0.0 - 149.0 mg/dL   HDL 62.00 >39.00 mg/dL   VLDL 11.2 0.0 - 40.0 mg/dL   LDL Cholesterol 79 0 - 99 mg/dL   Total CHOL/HDL Ratio 2    NonHDL 90.34   Comprehensive metabolic panel  Result Value Ref Range   Sodium 137 135 - 145 mEq/L   Potassium 4.5 3.5 - 5.1 mEq/L   Chloride 104 96 - 112 mEq/L   CO2 24 19 - 32 mEq/L   Glucose, Bld 111 (H) 70 - 99 mg/dL   BUN 25 (H) 6 - 23 mg/dL   Creatinine, Ser 1.38 0.40 - 1.50 mg/dL   Total Bilirubin 0.6 0.2 - 1.2 mg/dL   Alkaline Phosphatase 52 39 - 117 U/L   AST 17 0 - 37 U/L   ALT 14 0 - 53 U/L   Total Protein 6.7 6.0 - 8.3 g/dL   Albumin 4.2 3.5 - 5.2 g/dL   GFR 46.67 (L) >60.00 mL/min   Calcium 9.1 8.4 - 10.5 mg/dL   Lab Results  Component Value Date   HGBA1C 6.8 (A) 07/02/2020   A1c 6.5% through endo (09/4020)  Assessment & Plan:  This visit occurred during the SARS-CoV-2 public health emergency.  Safety protocols were in place, including screening questions prior to the visit, additional usage of staff PPE, and extensive cleaning of exam room while observing appropriate contact time as indicated for disinfecting solutions.   Problem List Items Addressed This Visit    Type 2 diabetes mellitus with neurological manifestations, controlled ([place])    Chronic, stable on  current regimen - appreciate endo care.  trulicity not tolerated, jardiance unaffordable.       Relevant Medications   metFORMIN (GLUCOPHAGE) 500 MG tablet   losartan (COZAAR) 50 MG tablet   pravastatin (PRAVACHOL) 80 MG tablet   Hyperlipidemia associated with type 2 diabetes mellitus (HCC)    Chronic, stable on pravastatin - continue. The ASCVD Risk score Mikey Bussing DC Jr., et al., 2013) failed to calculate for the following reasons:   The 2013 ASCVD risk score is only valid for ages 52 to 18       Relevant Medications    metFORMIN (GLUCOPHAGE) 500 MG tablet   losartan (COZAAR) 50 MG tablet   pravastatin (PRAVACHOL) 80 MG tablet   Essential hypertension, benign    Chronic, stable on lower losartan dose - continue 63m daily.       Relevant Medications   losartan (COZAAR) 50 MG tablet   amLODipine (NORVASC) 5 MG tablet   pravastatin (PRAVACHOL) 80 MG tablet   Constipation    Notes this is better off trulicity       Routine general medical examination at a health care facility - Primary    Preventative protocols reviewed and updated unless pt declined. Discussed healthy diet and lifestyle.       Advance directive discussed with patient    From prior PCP note: Advanced directive discussion - Has living will scanned and in chart 10/2016 [place] health care PO[place]---daughter then son. Would accept resuscitation--but no prolonged artificial ventilation. Would not want feeding tube if cognitively unaware.       Status post hip replacement, left       Meds ordered this encounter  Medications  . losartan (COZAAR) 50 MG tablet    Sig: Take 1 tablet (50 mg total) by mouth daily.    Dispense:  90 tablet    Refill:  3  . amLODipine (NORVASC) 5 MG tablet    Sig: Take 1 tablet (5 mg total) by mouth daily.    Dispense:  90 tablet    Refill:  3  . pravastatin (PRAVACHOL) 80 MG tablet    Sig: Take 1 tablet (80 mg total) by mouth daily.    Dispense:  90 tablet    Refill:  3   No orders of the defined types were placed in this encounter.   Patient instructions: Recheck height [place] of booster COVID shot to update your chart.  If interested, check with pharmacy about new 2 shot shingles series (shingrix).  Call to schedule eye exam - recommend yearly diabetic eye exam.  Return as needed or in 1 year for next physical/wellness visit   Follow up plan: Return in about 1 year (around 01/02/2022), or if symptoms worsen or fail to improve, for annual exam, prior  fasting for blood work.  JRia Bush MD

## 2021-01-02 NOTE — Assessment & Plan Note (Signed)
Chronic, stable on lower losartan dose - continue 50mg  daily.

## 2021-01-02 NOTE — Assessment & Plan Note (Signed)
Chronic, stable on current regimen - appreciate endo care.  trulicity not tolerated, jardiance unaffordable.

## 2021-01-17 ENCOUNTER — Other Ambulatory Visit: Payer: Self-pay | Admitting: Family Medicine

## 2021-03-19 DIAGNOSIS — I10 Essential (primary) hypertension: Secondary | ICD-10-CM | POA: Diagnosis not present

## 2021-03-19 DIAGNOSIS — E782 Mixed hyperlipidemia: Secondary | ICD-10-CM | POA: Diagnosis not present

## 2021-03-19 DIAGNOSIS — E1165 Type 2 diabetes mellitus with hyperglycemia: Secondary | ICD-10-CM | POA: Diagnosis not present

## 2021-03-19 DIAGNOSIS — E1142 Type 2 diabetes mellitus with diabetic polyneuropathy: Secondary | ICD-10-CM | POA: Diagnosis not present

## 2021-06-08 ENCOUNTER — Telehealth: Payer: Self-pay | Admitting: Family Medicine

## 2021-06-08 DIAGNOSIS — E1149 Type 2 diabetes mellitus with other diabetic neurological complication: Secondary | ICD-10-CM

## 2021-06-08 NOTE — Telephone Encounter (Signed)
Pt called and wanted to know if Dr.G would start managing his Diabetes now since [facility] is no longer at [facility]

## 2021-06-09 NOTE — Telephone Encounter (Signed)
Patient notified as instructed by telephone and verbalized understanding. Patient stated that he appreciates the help.

## 2021-06-09 NOTE — Telephone Encounter (Signed)
Ok to do. Thanks.  

## 2021-07-22 DIAGNOSIS — L821 Other seborrheic keratosis: Secondary | ICD-10-CM | POA: Diagnosis not present

## 2021-07-22 DIAGNOSIS — L570 Actinic keratosis: Secondary | ICD-10-CM | POA: Diagnosis not present

## 2021-08-20 DIAGNOSIS — L570 Actinic keratosis: Secondary | ICD-10-CM | POA: Diagnosis not present

## 2021-08-20 DIAGNOSIS — L578 Other skin changes due to chronic exposure to nonionizing radiation: Secondary | ICD-10-CM | POA: Diagnosis not present

## 2021-08-20 DIAGNOSIS — L821 Other seborrheic keratosis: Secondary | ICD-10-CM | POA: Diagnosis not present

## 2021-08-20 DIAGNOSIS — Z872 Personal history of diseases of the skin and subcutaneous tissue: Secondary | ICD-10-CM | POA: Diagnosis not present

## 2021-08-20 DIAGNOSIS — Z85828 Personal history of other malignant neoplasm of skin: Secondary | ICD-10-CM | POA: Diagnosis not present

## 2021-08-20 DIAGNOSIS — L218 Other seborrheic dermatitis: Secondary | ICD-10-CM | POA: Diagnosis not present

## 2021-10-06 ENCOUNTER — Telehealth: Payer: Self-pay | Admitting: Family Medicine

## 2021-10-06 MED ORDER — GLIPIZIDE ER 5 MG PO TB24: 5.0000 mg | ORAL_TABLET | Freq: Every day | ORAL | 3 refills | Status: DC

## 2021-10-06 NOTE — Telephone Encounter (Signed)
Pt is requesting a 90 day supply  Encourage patient to contact the pharmacy for refills or they can request refills through [place]:  Please schedule appointment if longer than 1 year  NEXT APPOINTMENT DATE:  MEDICATION:glipiZIDE (GLUCOTROL XL) 5 MG 24 hr tablet  Is the patient out of medication?   PHARMACY:[id number] - [place], [place] - [phone]   Let patient know to contact pharmacy at the end of the day to make sure medication is ready.  Please notify patient to allow 48-72 hours to process  CLINICAL FILLS OUT ALL BELOW:   LAST REFILL:  QTY:  REFILL DATE:    OTHER COMMENTS:    Okay for refill?  Please advise

## 2021-10-06 NOTE — Telephone Encounter (Signed)
Rx sent 

## 2021-12-28 ENCOUNTER — Other Ambulatory Visit: Payer: Self-pay | Admitting: Family Medicine

## 2021-12-28 DIAGNOSIS — I10 Essential (primary) hypertension: Secondary | ICD-10-CM

## 2021-12-28 DIAGNOSIS — E1169 Type 2 diabetes mellitus with other specified complication: Secondary | ICD-10-CM

## 2021-12-28 DIAGNOSIS — E1149 Type 2 diabetes mellitus with other diabetic neurological complication: Secondary | ICD-10-CM

## 2021-12-30 ENCOUNTER — Other Ambulatory Visit (INDEPENDENT_AMBULATORY_CARE_PROVIDER_SITE_OTHER): Payer: Medicare HMO

## 2021-12-30 DIAGNOSIS — E1169 Type 2 diabetes mellitus with other specified complication: Secondary | ICD-10-CM

## 2021-12-30 DIAGNOSIS — E785 Hyperlipidemia, unspecified: Secondary | ICD-10-CM

## 2021-12-30 DIAGNOSIS — E1149 Type 2 diabetes mellitus with other diabetic neurological complication: Secondary | ICD-10-CM | POA: Diagnosis not present

## 2021-12-30 DIAGNOSIS — I10 Essential (primary) hypertension: Secondary | ICD-10-CM | POA: Diagnosis not present

## 2021-12-30 LAB — COMPREHENSIVE METABOLIC PANEL
ALT: 18 U/L (ref 0–53)
AST: 24 U/L (ref 0–37)
Albumin: 4.4 g/dL (ref 3.5–5.2)
Alkaline Phosphatase: 54 U/L (ref 39–117)
BUN: 25 mg/dL — ABNORMAL HIGH (ref 6–23)
CO2: 24 mEq/L (ref 19–32)
Calcium: 9.4 mg/dL (ref 8.4–10.5)
Chloride: 100 mEq/L (ref 96–112)
Creatinine, Ser: 1.3 mg/dL (ref 0.40–1.50)
GFR: 49.79 mL/min — ABNORMAL LOW (ref >60.00)
Glucose, Bld: 131 mg/dL — ABNORMAL HIGH (ref 70–99)
Potassium: 4.5 mEq/L (ref 3.5–5.1)
Sodium: 132 mEq/L — ABNORMAL LOW (ref 135–145)
Total Bilirubin: 0.5 mg/dL (ref 0.2–1.2)
Total Protein: 7.1 g/dL (ref 6.0–8.3)

## 2021-12-30 LAB — MICROALBUMIN / CREATININE URINE RATIO
Creatinine,U: 38.6 mg/dL
Microalb Creat Ratio: 4.2 mg/g (ref 0.0–30.0)
Microalb, Ur: 1.6 mg/dL (ref 0.0–1.9)

## 2021-12-30 LAB — CBC WITH DIFFERENTIAL/PLATELET
Basophils Absolute: 0 10*3/uL (ref 0.0–0.1)
Basophils Relative: 0.6 % (ref 0.0–3.0)
Eosinophils Absolute: 0.1 10*3/uL (ref 0.0–0.7)
Eosinophils Relative: 2.5 % (ref 0.0–5.0)
HCT: 39.9 % (ref 39.0–52.0)
Hemoglobin: 13.2 g/dL (ref 13.0–17.0)
Lymphocytes Relative: 33.5 % (ref 12.0–46.0)
Lymphs Abs: 2 10*3/uL (ref 0.7–4.0)
MCHC: 33 g/dL (ref 30.0–36.0)
MCV: 94.1 fl (ref 78.0–100.0)
Monocytes Absolute: 0.4 10*3/uL (ref 0.1–1.0)
Monocytes Relative: 6.8 % (ref 3.0–12.0)
Neutro Abs: 3.3 10*3/uL (ref 1.4–7.7)
Neutrophils Relative %: 56.6 % (ref 43.0–77.0)
Platelets: 191 10*3/uL (ref 150.0–400.0)
RBC: 4.24 Mil/uL (ref 4.22–5.81)
RDW: 14.1 % (ref 11.5–15.5)
WBC: 5.8 10*3/uL (ref 4.0–10.5)

## 2021-12-30 LAB — LIPID PANEL
Cholesterol: 168 mg/dL (ref 0–200)
HDL: 69.7 mg/dL (ref >39.00)
LDL Cholesterol: 86 mg/dL (ref 0–99)
NonHDL: 98.74
Total CHOL/HDL Ratio: 2
Triglycerides: 64 mg/dL (ref 0.0–149.0)
VLDL: 12.8 mg/dL (ref 0.0–40.0)

## 2021-12-30 LAB — HEMOGLOBIN A1C: Hgb A1c MFr Bld: 6.3 % (ref 4.6–6.5)

## 2021-12-31 ENCOUNTER — Ambulatory Visit (INDEPENDENT_AMBULATORY_CARE_PROVIDER_SITE_OTHER): Payer: Medicare HMO

## 2021-12-31 VITALS — Wt 158.0 lb

## 2021-12-31 DIAGNOSIS — Z0000 Encounter for general adult medical examination without abnormal findings: Secondary | ICD-10-CM

## 2021-12-31 NOTE — Patient Instructions (Addendum)
[name] , Thank you for taking time to come for your Medicare Wellness Visit. I appreciate your ongoing commitment to your health goals. Please review the following plan we discussed and let me know if I can assist you in the future.   Screening recommendations/referrals: Colonoscopy: aged out Recommended yearly ophthalmology/optometry visit for glaucoma screening and checkup Recommended yearly dental visit for hygiene and checkup  Vaccinations: Influenza vaccine: n/d Pneumococcal vaccine: 11/15/17 Tdap vaccine: 01/15/11, due Shingles vaccine: Zostavax 09/24/13   Covid-19: 09/11/19, 10/02/19  Advanced directives: yes  Conditions/risks identified: no  Next appointment: Follow up in one year for your annual wellness visit. 01/03/13 @ 10:15 am by phone  Preventive Care 65 Years and Older, Male Preventive care refers to lifestyle choices and visits with your health care provider that can promote health and wellness. What does preventive care include? A yearly physical exam. This is also called an annual well check. Dental exams once or twice a year. Routine eye exams. Ask your health care provider how often you should have your eyes checked. Personal lifestyle choices, including: Daily care of your teeth and gums. Regular physical activity. Eating a healthy diet. Avoiding tobacco and drug use. Limiting alcohol use. Practicing safe sex. Taking low doses of aspirin every day. Taking vitamin and mineral supplements as recommended by your health care provider. What happens during an annual well check? The services and screenings done by your health care provider during your annual well check will depend on your age, overall health, lifestyle risk factors, and family history of disease. Counseling  Your health care provider may ask you questions about your: Alcohol use. Tobacco use. Drug use. Emotional well-being. Home and relationship well-being. Sexual activity. Eating  habits. History of falls. Memory and ability to understand (cognition). Work and work Statistician. Screening  You may have the following tests or measurements: Height, weight, and BMI. Blood pressure. Lipid and cholesterol levels. These may be checked every 5 years, or more frequently if you are over 13 years old. Skin check. Lung cancer screening. You may have this screening every year starting at age 57 if you have a 30-pack-year history of smoking and currently smoke or have quit within the past 15 years. Fecal occult blood test (FOBT) of the stool. You may have this test every year starting at age 70. Flexible sigmoidoscopy or colonoscopy. You may have a sigmoidoscopy every 5 years or a colonoscopy every 10 years starting at age 62. Prostate cancer screening. Recommendations will vary depending on your family history and other risks. Hepatitis C blood test. Hepatitis B blood test. Sexually transmitted disease (STD) testing. Diabetes screening. This is done by checking your blood sugar (glucose) after you have not eaten for a while (fasting). You may have this done every 1-3 years. Abdominal aortic aneurysm (AAA) screening. You may need this if you are a current or former smoker. Osteoporosis. You may be screened starting at age 37 if you are at high risk. Talk with your health care provider about your test results, treatment options, and if necessary, the need for more tests. Vaccines  Your health care provider may recommend certain vaccines, such as: Influenza vaccine. This is recommended every year. Tetanus, diphtheria, and acellular pertussis (Tdap, Td) vaccine. You may need a Td booster every 10 years. Zoster vaccine. You may need this after age 27. Pneumococcal 13-valent conjugate (PCV13) vaccine. One dose is recommended after age 57. Pneumococcal polysaccharide (PPSV23) vaccine. One dose is recommended after age 19. Talk to your  health care provider about which screenings and  vaccines you need and how often you need them. This information is not intended to replace advice given to you by your health care provider. Make sure you discuss any questions you have with your health care provider. Document Released: 08/29/2015 Document Revised: 04/21/2016 Document Reviewed: 06/03/2015 Elsevier Interactive Patient Education  2017 [employer] Prevention in the Home Falls can cause injuries. They can happen to people of all ages. There are many things you can do to make your home safe and to help prevent falls. What can I do on the outside of my home? Regularly fix the edges of walkways and driveways and fix any cracks. Remove anything that might make you trip as you walk through a door, such as a raised step or threshold. Trim any bushes or trees on the path to your home. Use bright outdoor lighting. Clear any walking paths of anything that might make someone trip, such as rocks or tools. Regularly check to see if handrails are loose or broken. Make sure that both sides of any steps have handrails. Any raised decks and porches should have guardrails on the edges. Have any leaves, snow, or ice cleared regularly. Use sand or salt on walking paths during winter. Clean up any spills in your garage right away. This includes oil or grease spills. What can I do in the bathroom? Use night lights. Install grab bars by the toilet and in the tub and shower. Do not use towel bars as grab bars. Use non-skid mats or decals in the tub or shower. If you need to sit down in the shower, use a plastic, non-slip stool. Keep the floor dry. Clean up any water that spills on the floor as soon as it happens. Remove soap buildup in the tub or shower regularly. Attach bath mats securely with double-sided non-slip rug tape. Do not have throw rugs and other things on the floor that can make you trip. What can I do in the bedroom? Use night lights. Make sure that you have a light by your  bed that is easy to reach. Do not use any sheets or blankets that are too big for your bed. They should not hang down onto the floor. Have a firm chair that has side arms. You can use this for support while you get dressed. Do not have throw rugs and other things on the floor that can make you trip. What can I do in the kitchen? Clean up any spills right away. Avoid walking on wet floors. Keep items that you use a lot in easy-to-reach places. If you need to reach something above you, use a strong step stool that has a grab bar. Keep electrical cords out of the way. Do not use floor polish or wax that makes floors slippery. If you must use wax, use non-skid floor wax. Do not have throw rugs and other things on the floor that can make you trip. What can I do with my stairs? Do not leave any items on the stairs. Make sure that there are handrails on both sides of the stairs and use them. Fix handrails that are broken or loose. Make sure that handrails are as long as the stairways. Check any carpeting to make sure that it is firmly attached to the stairs. Fix any carpet that is loose or worn. Avoid having throw rugs at the top or bottom of the stairs. If you do have throw rugs, attach them  to the floor with carpet tape. Make sure that you have a light switch at the top of the stairs and the bottom of the stairs. If you do not have them, ask someone to add them for you. What else can I do to help prevent falls? Wear shoes that: Do not have high heels. Have rubber bottoms. Are comfortable and fit you well. Are closed at the toe. Do not wear sandals. If you use a stepladder: Make sure that it is fully opened. Do not climb a closed stepladder. Make sure that both sides of the stepladder are locked into place. Ask someone to hold it for you, if possible. Clearly mark and make sure that you can see: Any grab bars or handrails. First and last steps. Where the edge of each step is. Use tools that  help you move around (mobility aids) if they are needed. These include: Canes. Walkers. Scooters. Crutches. Turn on the lights when you go into a dark area. Replace any light bulbs as soon as they burn out. Set up your furniture so you have a clear path. Avoid moving your furniture around. If any of your floors are uneven, fix them. If there are any pets around you, be aware of where they are. Review your medicines with your doctor. Some medicines can make you feel dizzy. This can increase your chance of falling. Ask your doctor what other things that you can do to help prevent falls. This information is not intended to replace advice given to you by your health care provider. Make sure you discuss any questions you have with your health care provider. Document Released: 05/29/2009 Document Revised: 01/08/2016 Document Reviewed: 09/06/2014 Elsevier Interactive Patient Education  2017 [employer].

## 2021-12-31 NOTE — Progress Notes (Signed)
Virtual Visit via Telephone Note  I connected with  John Sanford on 12/31/21 at 11:15 AM EDT by telephone and verified that I am speaking with the correct person using two identifiers.  Location: Patient: home Provider: [place] Persons participating in the virtual visit: [place]   I discussed the limitations, risks, security and privacy concerns of performing an evaluation and management service by telephone and the availability of in person appointments. The patient expressed understanding and agreed to proceed.  Interactive audio and video telecommunications were attempted between this nurse and patient, however failed, due to patient having technical difficulties OR patient did not have access to video capability.  We continued and completed visit with audio only.  Some vital signs may be absent or patient reported.   John David, LPN  Subjective:   John Sanford is a 88 y.o. male who presents for Medicare Annual/Subsequent preventive examination.  Review of Systems           Objective:    There were no vitals filed for this visit. There is no height or weight on file to calculate BMI.     12/30/2020   11:22 AM 08/08/2020   12:00 PM 07/30/2020    5:09 AM 07/29/2020   10:10 PM 07/25/2020   11:16 AM 05/26/2020   10:02 AM 04/03/2020    1:54 PM  Advanced Directives  Does Patient Have a Medical Advance Directive? Yes Yes  No Yes Yes No;Yes  Type of Paramedic of [place];Living will Living will   [place];Living will  [place];Living will  Does patient want to make changes to medical advance directive?      No - Patient declined   Copy of [place] in Chart? No - copy requested    No - copy requested    Would patient like information on creating a medical advance directive?   No - Patient declined        Current Medications (verified) Outpatient  Encounter Medications as of 12/31/2021  Medication Sig   glipiZIDE (GLUCOTROL XL) 5 MG 24 hr tablet Take by mouth.   losartan (COZAAR) 100 MG tablet    amLODipine (NORVASC) 5 MG tablet Take 1 tablet (5 mg total) by mouth daily.   aspirin 81 MG tablet Take 1 tablet (81 mg total) by mouth 2 (two) times daily after a meal.   Blood Glucose Monitoring Suppl (ONE TOUCH ULTRA 2) w/Device KIT Use as instructed to check blood sugar once daily.   docusate sodium (COLACE) 100 MG capsule Take 100 mg by mouth daily. OTC   gabapentin (NEURONTIN) 300 MG capsule Take 300 mg by mouth at bedtime.   glipiZIDE (GLUCOTROL XL) 5 MG 24 hr tablet Take 1 tablet (5 mg total) by mouth daily with breakfast.   glucose blood test strip Use as instructed to check blood sugar once daily.   ketoconazole (NIZORAL) 2 % cream Apply 1 application. topically daily.   Lancets (ONETOUCH DELICA PLUS [id number]) MISC Use as instructed to check blood sugar once daily.   losartan (COZAAR) 50 MG tablet TAKE 2 TABLETS BY MOUTH EVERY DAY   metFORMIN (GLUCOPHAGE) 500 MG tablet SMARTSIG:2 Tablet(s) By Mouth Morning-Evening   pravastatin (PRAVACHOL) 80 MG tablet Take 1 tablet (80 mg total) by mouth daily.   No facility-administered encounter medications on file as of 12/31/2021.    Allergies (verified) Patient has no known allergies.   History:  Past Medical History:  Diagnosis Date   Allergy    Arthritis    BPH (benign prostatic hypertrophy)    Cancer (HCC)    hx of skin cancer    Diabetes mellitus    type 2    Hyperlipidemia    Hypertension    Pneumonia    hx of    Past Surgical History:  Procedure Laterality Date   CATARACT EXTRACTION W/ INTRAOCULAR LENS IMPLANT     COLONOSCOPY     PROSTATE BIOPSY     negative   TONSILLECTOMY AND ADENOIDECTOMY     TOTAL HIP ARTHROPLASTY Left 07/29/2020   Procedure: LEFT TOTAL HIP ARTHROPLASTY ANTERIOR APPROACH;  Surgeon: Melrose Nakayama, MD;  Location: [facility];  Service: Orthopedics;   Laterality: Left;   VENTRAL HERNIA REPAIR     periumbilical   Family History  Problem Relation Age of Onset   Heart disease Mother    Diabetes Father    Heart disease Father    Diabetes Brother    Heart disease Brother    Pancreatic cancer Brother    Social History   Socioeconomic History   Marital status: Widowed    Spouse name: Not on file   Number of children: 3   Years of education: 7   Highest education level: Not on file  Occupational History   Occupation: retired Administrator  Tobacco Use   Smoking status: Former    Packs/day: 2.00    Years: 20.00    Pack years: 40.00    Types: Cigarettes    Quit date: 08/16/1972    Years since quitting: 49.4   Smokeless tobacco: Never  Vaping Use   Vaping Use: Never used  Substance and Sexual Activity   Alcohol use: No    Comment: rare   Drug use: No   Sexual activity: Not on file  Other Topics Concern   Not on file  Social History Narrative   Has living will.    Has health care POA---daughter then son   Would accept resuscitation--but no prolonged artificial ventilation   Would not want feeding tube if cognitively unaware   Widower - wife deceased 2007-01-07    Daughter lives nearby, son in [place] in [employer]    Social Determinants of Health   Financial Resource Strain: Not on file  Food Insecurity: Not on file  Transportation Needs: Not on file  Physical Activity: Not on file  Stress: Not on file  Social Connections: Not on file    Tobacco Counseling Counseling given: Not Answered   Clinical Intake:  Pre-visit preparation completed: Yes  Pain : No/denies pain     Nutritional Risks: None Diabetes: Yes CBG done?: No Did pt. bring in CBG monitor from home?: No  How often do you need to have someone help you when you read instructions, pamphlets, or other written materials from your doctor or pharmacy?: 1 - Never  Diabetic?yes Nutrition Risk Assessment:  Has the patient had any N/V/D  within the last 2 months?  No  Does the patient have any non-healing wounds?  No  Has the patient had any unintentional weight loss or weight gain?  No   Diabetes:  Is the patient diabetic?  Yes  If diabetic, was a CBG obtained today?  No  Did the patient bring in their glucometer from home?  No  How often do you monitor your CBG's? never  Financial Strains and Diabetes Management:  Are you having any  financial strains with the device, your supplies or your medication? No .  Does the patient want to be seen by Chronic Care Management for management of their diabetes?  No  Would the patient like to be referred to a Nutritionist or for Diabetic Management?  No   Diabetic Exams:  Diabetic Eye Exam: Completed 04/28/20. Overdue for diabetic eye exam. Pt has been advised about the importance in completing this exam.   Diabetic Foot Exam: Completed 05/21/20. Pt has been advised about the importance in completing this exam.     Interpreter Needed?: No  Information entered by :: Lorrie Barnes, LPN   Activities of Daily Living     View : No data to display.          Patient Care Team: Gutierrez, Javier, MD as PCP - General (Family Medicine)  Indicate any recent Medical Services you may have received from other than [facility] providers in the past year (date may be approximate).     Assessment:   This is a routine wellness examination for Ira.  Hearing/Vision screen No results found.  Dietary issues and exercise activities discussed:     Goals Addressed   None    Depression Screen    12/30/2020   11:25 AM 05/26/2020   10:02 AM 04/25/2019   12:16 PM 04/25/2019   11:30 AM 11/15/2017   10:11 AM 11/03/2016   10:11 AM 10/21/2015    9:01 AM  PHQ 2/9 Scores  PHQ - 2 Score 0 0 0 0 0 0 0  PHQ- 9 Score 0   0       Fall Risk    12/30/2020   11:23 AM 05/26/2020   10:02 AM 04/25/2019   12:16 PM 04/25/2019   11:30 AM 11/15/2017   10:11 AM  Fall Risk   Falls in the past year? 0 0 0 0  No  Number falls in past yr: 0      Injury with Fall? 0      Risk for fall due to : Medication side effect      Follow up Falls evaluation completed;Falls prevention discussed        FALL RISK PREVENTION PERTAINING TO THE HOME:  Any stairs in or around the home? No  If so, are there any without handrails? No  Home free of loose throw rugs in walkways, pet beds, electrical cords, etc? Yes  Adequate lighting in your home to reduce risk of falls? Yes   ASSISTIVE DEVICES UTILIZED TO PREVENT FALLS:  Life alert? No  Use of a cane, walker or w/c? No  Grab bars in the bathroom? No  Shower chair or bench in shower? No  Elevated toilet seat or a handicapped toilet? No    Cognitive Function:declined test       12/30/2020   11:28 AM  MMSE - Mini Mental State Exam  Not completed: Refused        Immunizations Immunization History  Administered Date(s) Administered   Fluad Quad(high Dose 65+) 04/25/2019, 05/21/2020   Influenza Split 07/27/2011   Influenza Whole 07/26/2007, 06/13/2008, 07/25/2009, 07/24/2010   Influenza, Seasonal, Injecte, Preservative Fre 08/30/2012   Influenza,inj,Quad PF,6+ Mos 05/28/2013, 09/20/2014, 04/21/2016, 05/10/2017, 05/17/2018   PFIZER(Purple Top)SARS-COV-2 Vaccination 09/11/2019, 10/02/2019   Pneumococcal Conjugate-13 03/12/2014   Pneumococcal Polysaccharide-23 07/24/2010, 11/15/2017   Td 02/13/2006, 01/15/2011   Zoster, Live 09/24/2013    TDAP status: Due, Education has been provided regarding the importance of this vaccine. Advised may receive this vaccine   at local pharmacy or Health Dept. Aware to provide a copy of the vaccination record if obtained from local pharmacy or Health Dept. Verbalized acceptance and understanding.  Flu Vaccine status: Declined, Education has been provided regarding the importance of this vaccine but patient still declined. Advised may receive this vaccine at local pharmacy or Health Dept. Aware to provide a copy of the  vaccination record if obtained from local pharmacy or Health Dept. Verbalized acceptance and understanding.  Pneumococcal vaccine status: Up to date  Covid-19 vaccine status: Completed vaccines  Qualifies for Shingles Vaccine? Yes   Zostavax completed Yes   Shingrix Completed?: No.    Education has been provided regarding the importance of this vaccine. Patient has been advised to call insurance company to determine out of pocket expense if they have not yet received this vaccine. Advised may also receive vaccine at local pharmacy or Health Dept. Verbalized acceptance and understanding.  Screening Tests Health Maintenance  Topic Date Due   Zoster Vaccines- Shingrix (1 of 2) Never done   COVID-19 Vaccine (3 - Pfizer risk series) 10/30/2019   TETANUS/TDAP  01/14/2021   OPHTHALMOLOGY EXAM  04/28/2021   FOOT EXAM  05/21/2021   INFLUENZA VACCINE  03/16/2022   HEMOGLOBIN A1C  07/02/2022   Pneumonia Vaccine 65+ Years old  Completed   HPV VACCINES  Aged Out    Health Maintenance  Health Maintenance Due  Topic Date Due   Zoster Vaccines- Shingrix (1 of 2) Never done   COVID-19 Vaccine (3 - Pfizer risk series) 10/30/2019   TETANUS/TDAP  01/14/2021   OPHTHALMOLOGY EXAM  04/28/2021   FOOT EXAM  05/21/2021    Colorectal cancer screening: No longer required.   Lung Cancer Screening: (Low Dose CT Chest recommended if Age 55-80 years, 30 pack-year currently smoking OR have quit w/in 15years.) does not qualify.   Additional Screening:  Hepatitis C Screening: does not qualify; Completed no  Vision Screening: Recommended annual ophthalmology exams for early detection of glaucoma and other disorders of the eye. Is the patient up to date with their annual eye exam?  No  Who is the provider or what is the name of the office in which the patient attends annual eye exams? No one If pt is not established with a provider, would they like to be referred to a provider to establish care? No .    Dental Screening: Recommended annual dental exams for proper oral hygiene  Community Resource Referral / Chronic Care Management: CRR required this visit?  No   CCM required this visit?  No      Plan:     I have personally reviewed and noted the following in the patient's chart:   Medical and social history Use of alcohol, tobacco or illicit drugs  Current medications and supplements including opioid prescriptions. Patient is not currently taking opioid prescriptions. Functional ability and status Nutritional status Physical activity Advanced directives List of other physicians Hospitalizations, surgeries, and ER visits in previous 12 months Vitals Screenings to include cognitive, depression, and falls Referrals and appointments  In addition, I have reviewed and discussed with patient certain preventive protocols, quality metrics, and best practice recommendations. A written personalized care plan for preventive services as well as general preventive health recommendations were provided to patient.     Lorrie S Barnes, LPN   12/31/2021   Nurse Notes: none    

## 2022-01-05 ENCOUNTER — Other Ambulatory Visit: Payer: Self-pay | Admitting: Family Medicine

## 2022-01-06 ENCOUNTER — Ambulatory Visit (INDEPENDENT_AMBULATORY_CARE_PROVIDER_SITE_OTHER): Payer: Medicare HMO | Admitting: Family Medicine

## 2022-01-06 ENCOUNTER — Encounter: Payer: Self-pay | Admitting: Family Medicine

## 2022-01-06 VITALS — BP 152/64 | HR 76 | Temp 97.6°F | Ht 66.5 in | Wt 156.5 lb

## 2022-01-06 DIAGNOSIS — R0989 Other specified symptoms and signs involving the circulatory and respiratory systems: Secondary | ICD-10-CM

## 2022-01-06 DIAGNOSIS — N183 Chronic kidney disease, stage 3 unspecified: Secondary | ICD-10-CM

## 2022-01-06 DIAGNOSIS — Z0000 Encounter for general adult medical examination without abnormal findings: Secondary | ICD-10-CM | POA: Diagnosis not present

## 2022-01-06 DIAGNOSIS — N400 Enlarged prostate without lower urinary tract symptoms: Secondary | ICD-10-CM | POA: Diagnosis not present

## 2022-01-06 DIAGNOSIS — K5900 Constipation, unspecified: Secondary | ICD-10-CM

## 2022-01-06 DIAGNOSIS — I10 Essential (primary) hypertension: Secondary | ICD-10-CM

## 2022-01-06 DIAGNOSIS — E1169 Type 2 diabetes mellitus with other specified complication: Secondary | ICD-10-CM

## 2022-01-06 DIAGNOSIS — E785 Hyperlipidemia, unspecified: Secondary | ICD-10-CM

## 2022-01-06 DIAGNOSIS — Z7189 Other specified counseling: Secondary | ICD-10-CM | POA: Diagnosis not present

## 2022-01-06 DIAGNOSIS — G479 Sleep disorder, unspecified: Secondary | ICD-10-CM | POA: Diagnosis not present

## 2022-01-06 DIAGNOSIS — E1149 Type 2 diabetes mellitus with other diabetic neurological complication: Secondary | ICD-10-CM

## 2022-01-06 DIAGNOSIS — E1122 Type 2 diabetes mellitus with diabetic chronic kidney disease: Secondary | ICD-10-CM | POA: Diagnosis not present

## 2022-01-06 MED ORDER — TAMSULOSIN HCL 0.4 MG PO CAPS: 0.4000 mg | ORAL_CAPSULE | Freq: Every day | ORAL | 6 refills | Status: DC

## 2022-01-06 MED ORDER — SAW PALMETTO 500 MG PO CAPS: 1.0000 | ORAL_CAPSULE | Freq: Every day | ORAL | 0 refills | Status: DC

## 2022-01-06 MED ORDER — LOSARTAN POTASSIUM 50 MG PO TABS: 50.0000 mg | ORAL_TABLET | Freq: Every day | ORAL | 3 refills | Status: DC

## 2022-01-06 MED ORDER — METFORMIN HCL 1000 MG PO TABS: 1000.0000 mg | ORAL_TABLET | Freq: Two times a day (BID) | ORAL | 3 refills | Status: DC

## 2022-01-06 MED ORDER — AMLODIPINE BESYLATE 5 MG PO TABS: 5.0000 mg | ORAL_TABLET | Freq: Every day | ORAL | 3 refills | Status: DC

## 2022-01-06 MED ORDER — GLIPIZIDE ER 5 MG PO TB24: 5.0000 mg | ORAL_TABLET | Freq: Every day | ORAL | 3 refills | Status: DC

## 2022-01-06 MED ORDER — PRAVASTATIN SODIUM 80 MG PO TABS: 80.0000 mg | ORAL_TABLET | Freq: Every day | ORAL | 3 refills | Status: DC

## 2022-01-06 NOTE — Progress Notes (Unsigned)
Patient ID: John Sanford, male    DOB: 01-12-1933, 88 y.o.   MRN: [id number]  This visit was conducted in person.  BP (!) 152/64 (BP Location: Right Arm, Cuff Size: Normal)   Pulse 76   Temp 97.6 F (36.4 C) (Temporal)   Ht 5' 6.5" (1.689 m)   Wt 156 lb 8 oz (71 kg)   SpO2 99%   BMI 24.88 kg/m   BP Readings from Last 3 Encounters:  01/06/22 (!) 152/64  01/02/21 134/60  08/08/20 (!) 162/68  154/60 on recheck  CC: CPE Subjective:   HPI: John Sanford is a 88 y.o. male presenting on 01/06/2022 for Annual Exam (MCR prt 2. )   Saw health advisor last week for medicare wellness visit. Note reviewed. Cognitive assessment was not performed.   No results found.  Flowsheet Row Clinical Support from 12/31/2021 in [facility]  PHQ-2 Total Score 0          12/31/2021   11:22 AM 12/30/2020   11:23 AM 05/26/2020   10:02 AM 04/25/2019   12:16 PM 04/25/2019   11:30 AM  Fall Risk   Falls in the past year? 0 0 0 0 0  Number falls in past yr: 0 0     Injury with Fall? 0 0     Risk for fall due to : No Fall Risks Medication side effect     Follow up Falls evaluation completed Falls evaluation completed;Falls prevention discussed      S/p L hip replacement surgery 07/2020.     Previously followed by [facility] endocrinology for diabetes - last seen 03/2021 - continues metformin 514m 2 tab bid and glipizide XL 531mdaily. Trulicity caused indigestion and bad constipation and weight loss. JaVania Reaas unaffordable. His endo left [facility] - he requests PCP continue managing diabetes.   BP elevated - more active recently. Also has only been taking only losartan 5025mightly.   Stopped gabapentin due to concerns over memory difficulty.  Notes a little more difficulty sleeping. Melatonin was effective but caused diarrhea.   Preventative: Colon cancer screening - colonoscopy 2009 TA ([facility] aged out. Denies blood in stool.  Prostate cancer screening - aged out.  Notes nocturia, started saw palmetto 500m3mce daily. Could be interested in flomax.  Lung cancer screening - aged out Flu shot - yearly [place], 09/[place], no booster Td 2012 Pneumovax23 2011, 2019 prevnar-13 2015 Zostavax 2015  Shingrix - discussed, to check with pharmacy Advanced directive discussion - Has living will scanned and in chart 10/2016. Has health care POA---daughter then son. Would accept resuscitation--but no prolonged artificial ventilation. Would not want feeding tube if cognitively unaware.  Seat belt use discussed.  Sunscreen use discussed. No changing moles on skin. Has seen derm for h/o skin cancer to back.  Ex smoker - quit remotely Alcohol - none Dentist - full dentures  Eye exam - yearly ([place]) Bowel - notes mild constipation to metformin, managed with colace with benefit.  Bladder - no incontinence   Widower - wife deceased 4/20Apr 30, 2008[place]ter lives nearby, [place]  Relevant past medical, surgical, family and social history reviewed and updated as indicated. Interim medical history since our last visit reviewed. Allergies and medications reviewed and updated. Outpatient Medications Prior to Visit  Medication Sig Dispense Refill  . aspirin 81 MG tablet Take 1 tablet (81 mg total) by  mouth 2 (two) times daily after a meal. 60 tablet 0  . docusate sodium (COLACE) 100 MG capsule Take 100 mg by mouth daily. OTC    . glucose blood test strip Use as instructed to check blood sugar once daily. 100 each 3  . ketoconazole (NIZORAL) 2 % cream Apply 1 application. topically daily.    . Lancets (ONETOUCH DELICA PLUS [id number]) MISC Use as instructed to check blood sugar once daily. 100 each 3  . amLODipine (NORVASC) 5 MG tablet Take 1 tablet (5 mg total) by mouth daily. 90 tablet 3  . glipiZIDE (GLUCOTROL XL) 5 MG 24 hr tablet Take 1 tablet (5 mg total) by mouth daily with breakfast. 30 tablet 3  . losartan (COZAAR) 50  MG tablet TAKE 2 TABLETS BY MOUTH EVERY DAY (Patient taking differently: Takes 1 tablet once a daily) 180 tablet 3  . metFORMIN (GLUCOPHAGE) 500 MG tablet SMARTSIG:2 Tablet(s) By Mouth Morning-Evening    . pravastatin (PRAVACHOL) 80 MG tablet Take 1 tablet (80 mg total) by mouth daily. 90 tablet 3  . Blood Glucose Monitoring Suppl (ONE TOUCH ULTRA 2) w/Device KIT Use as instructed to check blood sugar once daily. 1 kit 0  . gabapentin (NEURONTIN) 300 MG capsule Take 300 mg by mouth at bedtime.    Marland Kitchen losartan (COZAAR) 100 MG tablet      No facility-administered medications prior to visit.     Per HPI unless specifically indicated in ROS section below Review of Systems  Constitutional:  Negative for activity change, appetite change, chills, fatigue, fever and unexpected weight change.  HENT:  Negative for hearing loss.   Eyes:  Negative for visual disturbance.  Respiratory:  Negative for cough, chest tightness, shortness of breath and wheezing.   Cardiovascular:  Positive for leg swelling (mild). Negative for chest pain and palpitations.  Gastrointestinal:  Negative for abdominal distention, abdominal pain, blood in stool, constipation, diarrhea, nausea and vomiting.  Genitourinary:  Negative for difficulty urinating and hematuria.  Musculoskeletal:  Negative for arthralgias, myalgias and neck pain.  Skin:  Negative for rash.  Neurological:  Negative for dizziness, seizures, syncope and headaches.  Hematological:  Negative for adenopathy. Bruises/bleeds easily.  Psychiatric/Behavioral:  Negative for dysphoric mood. The patient is not nervous/anxious.    Objective:  BP (!) 152/64 (BP Location: Right Arm, Cuff Size: Normal)   Pulse 76   Temp 97.6 F (36.4 C) (Temporal)   Ht 5' 6.5" (1.689 m)   Wt 156 lb 8 oz (71 kg)   SpO2 99%   BMI 24.88 kg/m   Wt Readings from Last 3 Encounters:  01/06/22 156 lb 8 oz (71 kg)  12/31/21 158 lb (71.7 kg)  01/02/21 158 lb 5 oz (71.8 kg)       Physical Exam Vitals and nursing note reviewed.  Constitutional:      General: He is not in acute distress.    Appearance: Normal appearance. He is well-developed. He is not ill-appearing.  HENT:     Head: Normocephalic and atraumatic.     Right Ear: Hearing, tympanic membrane, ear canal and external ear normal.     Left Ear: Hearing, tympanic membrane, ear canal and external ear normal.  Eyes:     General: No scleral icterus.    Extraocular Movements: Extraocular movements intact.     Conjunctiva/sclera: Conjunctivae normal.     Pupils: Pupils are equal, round, and reactive to light.  Neck:     Thyroid: No thyroid mass or thyromegaly.  Vascular: Carotid bruit (R bruit) present.  Cardiovascular:     Rate and Rhythm: Normal rate and regular rhythm.     Pulses: Normal pulses.          Radial pulses are 2+ on the right side and 2+ on the left side.     Heart sounds: Normal heart sounds. No murmur heard. Pulmonary:     Effort: Pulmonary effort is normal. No respiratory distress.     Breath sounds: Normal breath sounds. No wheezing, rhonchi or rales.  Abdominal:     General: Bowel sounds are normal. There is no distension.     Palpations: Abdomen is soft. There is no mass.     Tenderness: There is no abdominal tenderness. There is no guarding or rebound.     Hernia: No hernia is present.  Musculoskeletal:        General: Normal range of motion.     Cervical back: Normal range of motion and neck supple.     Right lower leg: No edema.     Left lower leg: No edema.  Lymphadenopathy:     Cervical: No cervical adenopathy.  Skin:    General: Skin is warm and dry.     Findings: No rash.  Neurological:     General: No focal deficit present.     Mental Status: He is alert and oriented to person, place, and time.     Comments:  Recall 3/3  Calculation 4/5 DLOW  Psychiatric:        Mood and Affect: Mood normal.        Behavior: Behavior normal.        Thought Content: Thought  content normal.        Judgment: Judgment normal.      Results for orders placed or performed in visit on 12/30/21  CBC with Differential/Platelet  Result Value Ref Range   WBC 5.8 4.0 - 10.5 K/uL   RBC 4.24 4.22 - 5.81 Mil/uL   Hemoglobin 13.2 13.0 - 17.0 g/dL   HCT 39.9 39.0 - 52.0 %   MCV 94.1 78.0 - 100.0 fl   MCHC 33.0 30.0 - 36.0 g/dL   RDW 14.1 11.5 - 15.5 %   Platelets 191.0 150.0 - 400.0 K/uL   Neutrophils Relative % 56.6 43.0 - 77.0 %   Lymphocytes Relative 33.5 12.0 - 46.0 %   Monocytes Relative 6.8 3.0 - 12.0 %   Eosinophils Relative 2.5 0.0 - 5.0 %   Basophils Relative 0.6 0.0 - 3.0 %   Neutro Abs 3.3 1.4 - 7.7 K/uL   Lymphs Abs 2.0 0.7 - 4.0 K/uL   Monocytes Absolute 0.4 0.1 - 1.0 K/uL   Eosinophils Absolute 0.1 0.0 - 0.7 K/uL   Basophils Absolute 0.0 0.0 - 0.1 K/uL  Microalbumin / creatinine urine ratio  Result Value Ref Range   Microalb, Ur 1.6 0.0 - 1.9 mg/dL   Creatinine,U 38.6 mg/dL   Microalb Creat Ratio 4.2 0.0 - 30.0 mg/g  Hemoglobin A1c  Result Value Ref Range   Hgb A1c MFr Bld 6.3 4.6 - 6.5 %  Comprehensive metabolic panel  Result Value Ref Range   Sodium 132 (L) 135 - 145 mEq/L   Potassium 4.5 3.5 - 5.1 mEq/L   Chloride 100 96 - 112 mEq/L   CO2 24 19 - 32 mEq/L   Glucose, Bld 131 (H) 70 - 99 mg/dL   BUN 25 (H) 6 - 23 mg/dL   Creatinine, Ser 1.30 0.40 -  1.50 mg/dL   Total Bilirubin 0.5 0.2 - 1.2 mg/dL   Alkaline Phosphatase 54 39 - 117 U/L   AST 24 0 - 37 U/L   ALT 18 0 - 53 U/L   Total Protein 7.1 6.0 - 8.3 g/dL   Albumin 4.4 3.5 - 5.2 g/dL   GFR 49.79 (L) >60.00 mL/min   Calcium 9.4 8.4 - 10.5 mg/dL  Lipid panel  Result Value Ref Range   Cholesterol 168 0 - 200 mg/dL   Triglycerides 64.0 0.0 - 149.0 mg/dL   HDL 69.70 >39.00 mg/dL   VLDL 12.8 0.0 - 40.0 mg/dL   LDL Cholesterol 86 0 - 99 mg/dL   Total CHOL/HDL Ratio 2    NonHDL 98.74     Assessment & Plan:   Problem List Items Addressed This Visit     Health maintenance  examination - Primary (Chronic)    Preventative protocols reviewed and updated unless pt declined. Discussed healthy diet and lifestyle.        Advanced directives, counseling/discussion (Chronic)    Advanced directive discussion - Has living will scanned and in chart 10/2016. Has health care POA---daughter then son. Would accept resuscitation--but no prolonged artificial ventilation. Would not want feeding tube if cognitively unaware.       Type 2 diabetes mellitus with neurological manifestations, controlled (HCC)    Chronic, stable on metformin 1000 mg BID and glipizide XL 55m daily with breakfast - continue this regimen. He requests PCP take over diabetes management [facility] This is reasonable.        Relevant Medications   glipiZIDE (GLUCOTROL XL) 5 MG 24 hr tablet   metFORMIN (GLUCOPHAGE) 1000 MG tablet   losartan (COZAAR) 50 MG tablet   pravastatin (PRAVACHOL) 80 MG tablet   Hyperlipidemia associated with type 2 diabetes mellitus (HCC)    Chronic, stable on pravastatin - continue. LDL 86.  The ASCVD Risk score (Arnett DK, et al., 2019) failed to calculate for the following reasons:   The 2019 ASCVD risk score is only valid for ages 468to 770       Relevant Medications   glipiZIDE (GLUCOTROL XL) 5 MG 24 hr tablet   metFORMIN (GLUCOPHAGE) 1000 MG tablet   losartan (COZAAR) 50 MG tablet   amLODipine (NORVASC) 5 MG tablet   pravastatin (PRAVACHOL) 80 MG tablet   Essential hypertension, benign    Chronic, mildly elevated today however overall adequate.  Continue current regimen including losartan 522mdaily.  Monitor flomax effect.       Relevant Medications   losartan (COZAAR) 50 MG tablet   amLODipine (NORVASC) 5 MG tablet   pravastatin (PRAVACHOL) 80 MG tablet   Constipation    Mild attributed to metformin controlled with colace. Will continue to monitor.       BPH without obstruction/lower urinary tract symptoms    Notes  worsening difficulty with this, despite starting saw palmetto 50026m few weeks ago. Requests trial of medication - will start flomax, monitoring for orthostatic hypotension. Advised take at night time.        Relevant Medications   tamsulosin (FLOMAX) 0.4 MG CAPS capsule   Sleep disturbance    Stopped gabapentin due to concerns over memory effect. Overall stable off medication.        Right carotid bruit    Newly noted - check baseline carotid [place]    Relevant Orders   VAS [place]   CKD stage  3 due to type 2 diabetes mellitus ([facility])    Present over the past few years. Discussed with patient, encouraged limiting nephrotoxic agents, ensuring good hydration status.  Consider tighter BP control due to this.        Relevant Medications   glipiZIDE (GLUCOTROL XL) 5 MG 24 hr tablet   metFORMIN (GLUCOPHAGE) 1000 MG tablet   losartan (COZAAR) 50 MG tablet   pravastatin (PRAVACHOL) 80 MG tablet     Meds ordered this encounter  Medications  . glipiZIDE (GLUCOTROL XL) 5 MG 24 hr tablet    Sig: Take 1 tablet (5 mg total) by mouth daily with breakfast.    Dispense:  90 tablet    Refill:  3  . metFORMIN (GLUCOPHAGE) 1000 MG tablet    Sig: Take 1 tablet (1,000 mg total) by mouth 2 (two) times daily with a meal.    Dispense:  180 tablet    Refill:  3  . losartan (COZAAR) 50 MG tablet    Sig: Take 1 tablet (50 mg total) by mouth daily.    Dispense:  90 tablet    Refill:  3  . amLODipine (NORVASC) 5 MG tablet    Sig: Take 1 tablet (5 mg total) by mouth daily.    Dispense:  90 tablet    Refill:  3  . pravastatin (PRAVACHOL) 80 MG tablet    Sig: Take 1 tablet (80 mg total) by mouth daily.    Dispense:  90 tablet    Refill:  3  . tamsulosin (FLOMAX) 0.4 MG CAPS capsule    Sig: Take 1 capsule (0.4 mg total) by mouth daily.    Dispense:  30 capsule    Refill:  6  . Saw Palmetto 500 MG CAPS    Sig: Take 1 capsule (500 mg total) by mouth daily.    Refill:  0   No orders of the  defined types were placed in this encounter.   Patient instructions: If interested, check with pharmacy about new 2 shot shingles series (shingrix).  Try flomax 0.9m nightly.  We will call you to schedule a neck ultrasound to check carotid artery.  You are doing well today Return as needed or in 6 months for diabetes follow up visit.   Follow up plan: Return in about 6 months (around 07/09/2022) for follow up visit.  JRia Bush MD

## 2022-01-06 NOTE — Patient Instructions (Addendum)
If interested, check with pharmacy about new 2 shot shingles series (shingrix).  Try flomax 0.'4mg'$  nightly.  We will call you to schedule a neck ultrasound to check carotid artery.  You are doing well today Return as needed or in 6 months for diabetes follow up visit.   Health Maintenance After Age 86 After age 53, you are at a higher risk for certain long-term diseases and infections as well as injuries from falls. Falls are a major cause of broken bones and head injuries in people who are older than age 75. Getting regular preventive care can help to keep you healthy and well. Preventive care includes getting regular testing and making lifestyle changes as recommended by your health care provider. Talk with your health care provider about: Which screenings and tests you should have. A screening is a test that checks for a disease when you have no symptoms. A diet and exercise plan that is right for you. What should I know about screenings and tests to prevent falls? Screening and testing are the best ways to find a health problem early. Early diagnosis and treatment give you the best chance of managing medical conditions that are common after age 59. Certain conditions and lifestyle choices may make you more likely to have a fall. Your health care provider may recommend: Regular vision checks. Poor vision and conditions such as cataracts can make you more likely to have a fall. If you wear glasses, make sure to get your prescription updated if your vision changes. Medicine review. Work with your health care provider to regularly review all of the medicines you are taking, including over-the-counter medicines. Ask your health care provider about any side effects that may make you more likely to have a fall. Tell your health care provider if any medicines that you take make you feel dizzy or sleepy. Strength and balance checks. Your health care provider may recommend certain tests to check your strength  and balance while standing, walking, or changing positions. Foot health exam. Foot pain and numbness, as well as not wearing proper footwear, can make you more likely to have a fall. Screenings, including: Osteoporosis screening. Osteoporosis is a condition that causes the bones to get weaker and break more easily. Blood pressure screening. Blood pressure changes and medicines to control blood pressure can make you feel dizzy. Depression screening. You may be more likely to have a fall if you have a fear of falling, feel depressed, or feel unable to do activities that you used to do. Alcohol use screening. Using too much alcohol can affect your balance and may make you more likely to have a fall. Follow these instructions at home: Lifestyle Do not drink alcohol if: Your health care provider tells you not to drink. If you drink alcohol: Limit how much you have to: 0-1 drink a day for women. 0-2 drinks a day for men. Know how much alcohol is in your drink. In the [place] one drink equals one 12 oz bottle of beer (355 mL), one 5 oz glass of wine (148 mL), or one 1 oz glass of hard liquor (44 mL). Do not use any products that contain nicotine or tobacco. These products include cigarettes, chewing tobacco, and vaping devices, such as e-cigarettes. If you need help quitting, ask your health care provider. Activity  Follow a regular exercise program to stay fit. This will help you maintain your balance. Ask your health care provider what types of exercise are appropriate for you. If you  need a cane or walker, use it as recommended by your health care provider. Wear supportive shoes that have nonskid soles. Safety  Remove any tripping hazards, such as rugs, cords, and clutter. Install safety equipment such as grab bars in bathrooms and safety rails on stairs. Keep rooms and walkways well-lit. General instructions Talk with your health care provider about your risks for falling. Tell your health  care provider if: You fall. Be sure to tell your health care provider about all falls, even ones that seem minor. You feel dizzy, tiredness (fatigue), or off-balance. Take over-the-counter and prescription medicines only as told by your health care provider. These include supplements. Eat a healthy diet and maintain a healthy weight. A healthy diet includes low-fat dairy products, low-fat (lean) meats, and fiber from whole grains, beans, and lots of fruits and vegetables. Stay current with your vaccines. Schedule regular health, dental, and eye exams. Summary Having a healthy lifestyle and getting preventive care can help to protect your health and wellness after age 83. Screening and testing are the best way to find a health problem early and help you avoid having a fall. Early diagnosis and treatment give you the best chance for managing medical conditions that are more common for people who are older than age 38. Falls are a major cause of broken bones and head injuries in people who are older than age 86. Take precautions to prevent a fall at home. Work with your health care provider to learn what changes you can make to improve your health and wellness and to prevent falls. This information is not intended to replace advice given to you by your health care provider. Make sure you discuss any questions you have with your health care provider. Document Revised: 12/22/2020 Document Reviewed: 12/22/2020 Elsevier Patient Education  [place].

## 2022-01-07 DIAGNOSIS — I739 Peripheral vascular disease, unspecified: Secondary | ICD-10-CM | POA: Insufficient documentation

## 2022-01-07 DIAGNOSIS — N183 Chronic kidney disease, stage 3 unspecified: Secondary | ICD-10-CM | POA: Insufficient documentation

## 2022-01-07 DIAGNOSIS — E1122 Type 2 diabetes mellitus with diabetic chronic kidney disease: Secondary | ICD-10-CM | POA: Insufficient documentation

## 2022-01-07 DIAGNOSIS — R0989 Other specified symptoms and signs involving the circulatory and respiratory systems: Secondary | ICD-10-CM | POA: Insufficient documentation

## 2022-01-07 DIAGNOSIS — Z0000 Encounter for general adult medical examination without abnormal findings: Secondary | ICD-10-CM | POA: Insufficient documentation

## 2022-01-07 NOTE — Assessment & Plan Note (Addendum)
Present over the past few years. Discussed with patient, encouraged limiting nephrotoxic agents, ensuring good hydration status.  Consider tighter BP control due to this.

## 2022-01-07 NOTE — Assessment & Plan Note (Signed)
Newly noted - check baseline carotid US

## 2022-01-07 NOTE — Assessment & Plan Note (Addendum)
Chronic, stable on metformin 1000 mg BID and glipizide XL '5mg'$  daily with breakfast - continue this regimen. He requests PCP take over diabetes management as his endocrinologist left [facility]. This is reasonable.

## 2022-01-07 NOTE — Assessment & Plan Note (Signed)
Advanced directive discussion - Has living will scanned and in chart 10/2016. Has health care POA---daughter then son. Would accept resuscitation--but no prolonged artificial ventilation. Would not want feeding tube if cognitively unaware.

## 2022-01-07 NOTE — Assessment & Plan Note (Addendum)
Stopped gabapentin due to concerns over memory effect. Overall stable off medication.

## 2022-01-07 NOTE — Assessment & Plan Note (Addendum)
Mild attributed to metformin controlled with colace. Will continue to monitor.

## 2022-01-07 NOTE — Assessment & Plan Note (Signed)
Preventative protocols reviewed and updated unless pt declined. Discussed healthy diet and lifestyle.  

## 2022-01-07 NOTE — Assessment & Plan Note (Addendum)
Chronic, stable on pravastatin - continue. LDL 86.  The ASCVD Risk score (Arnett DK, et al., 2019) failed to calculate for the following reasons:   The 2019 ASCVD risk score is only valid for ages 63 to 52

## 2022-01-07 NOTE — Assessment & Plan Note (Signed)
Notes worsening difficulty with this, despite starting saw palmetto '500mg'$  a few weeks ago. Requests trial of medication - will start flomax, monitoring for orthostatic hypotension. Advised take at night time.

## 2022-01-07 NOTE — Assessment & Plan Note (Signed)
Chronic, mildly elevated today however overall adequate.  Continue current regimen including losartan '50mg'$  daily.  Monitor flomax effect.

## 2022-01-20 ENCOUNTER — Ambulatory Visit (HOSPITAL_COMMUNITY)
Admission: RE | Admit: 2022-01-20 | Discharge: 2022-01-20 | Disposition: A | Discharge status: Home / Self Care | Payer: Medicare HMO | Source: Ambulatory Visit | Attending: Family Medicine | Admitting: Family Medicine

## 2022-01-20 DIAGNOSIS — R0989 Other specified symptoms and signs involving the circulatory and respiratory systems: Secondary | ICD-10-CM | POA: Diagnosis not present

## 2022-01-24 ENCOUNTER — Encounter: Payer: Self-pay | Admitting: Family Medicine

## 2022-07-01 ENCOUNTER — Other Ambulatory Visit: Payer: Self-pay | Admitting: Family Medicine

## 2022-07-01 DIAGNOSIS — N400 Enlarged prostate without lower urinary tract symptoms: Secondary | ICD-10-CM

## 2022-07-12 ENCOUNTER — Ambulatory Visit (INDEPENDENT_AMBULATORY_CARE_PROVIDER_SITE_OTHER): Payer: Medicare HMO | Admitting: Family Medicine

## 2022-07-12 ENCOUNTER — Encounter: Payer: Self-pay | Admitting: Family Medicine

## 2022-07-12 VITALS — BP 136/70 | HR 76 | Temp 97.7°F | Ht 66.5 in | Wt 161.0 lb

## 2022-07-12 DIAGNOSIS — Z23 Encounter for immunization: Secondary | ICD-10-CM

## 2022-07-12 DIAGNOSIS — E1122 Type 2 diabetes mellitus with diabetic chronic kidney disease: Secondary | ICD-10-CM

## 2022-07-12 DIAGNOSIS — I739 Peripheral vascular disease, unspecified: Secondary | ICD-10-CM | POA: Diagnosis not present

## 2022-07-12 DIAGNOSIS — E1149 Type 2 diabetes mellitus with other diabetic neurological complication: Secondary | ICD-10-CM | POA: Diagnosis not present

## 2022-07-12 DIAGNOSIS — N183 Chronic kidney disease, stage 3 unspecified: Secondary | ICD-10-CM | POA: Diagnosis not present

## 2022-07-12 LAB — TSH: TSH: 1.05 u[IU]/mL (ref 0.35–5.50)

## 2022-07-12 LAB — RENAL FUNCTION PANEL
Albumin: 4.2 g/dL (ref 3.5–5.2)
BUN: 25 mg/dL — ABNORMAL HIGH (ref 6–23)
CO2: 26 mEq/L (ref 19–32)
Calcium: 9.2 mg/dL (ref 8.4–10.5)
Chloride: 100 mEq/L (ref 96–112)
Creatinine, Ser: 1.34 mg/dL (ref 0.40–1.50)
GFR: 47.83 mL/min — ABNORMAL LOW (ref >60.00)
Glucose, Bld: 133 mg/dL — ABNORMAL HIGH (ref 70–99)
Phosphorus: 3 mg/dL (ref 2.3–4.6)
Potassium: 4.8 mEq/L (ref 3.5–5.1)
Sodium: 133 mEq/L — ABNORMAL LOW (ref 135–145)

## 2022-07-12 LAB — POCT GLYCOSYLATED HEMOGLOBIN (HGB A1C): Hemoglobin A1C: 5.6 % (ref 4.0–5.6)

## 2022-07-12 LAB — VITAMIN D 25 HYDROXY (VIT D DEFICIENCY, FRACTURES): VITD: 43.49 ng/mL (ref 30.00–100.00)

## 2022-07-12 NOTE — Assessment & Plan Note (Signed)
Update renal panel as per below.

## 2022-07-12 NOTE — Patient Instructions (Addendum)
Flu shot today  Labs today. We will be in touch with results.  Schedule yearly diabetic eye exam.  You are doing well today.  Return in 6 months for physical/wellness visit.

## 2022-07-12 NOTE — Progress Notes (Signed)
Patient ID: John Sanford, male    DOB: Dec 20, 1932, 88 y.o.   MRN: [id number]  This visit was conducted in person.  BP 136/70 (BP Location: Left Arm, Patient Position: Sitting)   Pulse 76   Temp 97.7 F (36.5 C) (Skin)   Ht 5' 6.5" (1.689 m)   Wt 161 lb (73 kg)   SpO2 99%   BMI 25.60 kg/m    CC: 6 mo f/u visit  Subjective:   HPI: John Sanford is a 88 y.o. male presenting on 07/12/2022 for Follow-up (6 month)   DM - does not regularly check sugars. Only eating 2 meals a day. Compliant with antihyperglycemic regimen which includes: glipizide XL '5mg'$  daily with breakfsat, metformin '1000mg'$  bid. Trulicity intolerance, jardiance unaffordable. Denies low sugars or hypoglycemic symptoms. Denies paresthesias, blurry vision. Last diabetic eye exam 04/2020 - DUE. Glucometer brand: one touch. Last foot exam: 05/2020 - DUE. DSME: referred 05/2020. Has seen [facility].  Lab Results  Component Value Date   HGBA1C 5.6 07/12/2022   Diabetic Foot Exam - Simple   Simple Foot Form Diabetic Foot exam was performed with the following findings: Yes 07/12/2022 11:30 AM  Visual Inspection No deformities, no ulcerations, no other skin breakdown bilaterally: Yes Sensation Testing Intact to touch and monofilament testing bilaterally: Yes Pulse Check Posterior Tibialis and Dorsalis pulse intact bilaterally: Yes Comments Bunion on right     Lab Results  Component Value Date   MICROALBUR 1.6 12/30/2021         Relevant past medical, surgical, family and social history reviewed and updated as indicated. Interim medical history since our last visit reviewed. Allergies and medications reviewed and updated. Outpatient Medications Prior to Visit  Medication Sig Dispense Refill   amLODipine (NORVASC) 5 MG tablet Take 1 tablet (5 mg total) by mouth daily. 90 tablet 3   aspirin 81 MG tablet Take 1 tablet (81 mg total) by mouth 2 (two) times daily after a meal. 60 tablet 0   docusate  sodium (COLACE) 100 MG capsule Take 100 mg by mouth daily. OTC     glipiZIDE (GLUCOTROL XL) 5 MG 24 hr tablet Take 1 tablet (5 mg total) by mouth daily with breakfast. 90 tablet 3   glucose blood test strip Use as instructed to check blood sugar once daily. 100 each 3   ketoconazole (NIZORAL) 2 % cream Apply 1 application. topically daily.     Lancets (ONETOUCH DELICA PLUS [id number]) MISC Use as instructed to check blood sugar once daily. 100 each 3   losartan (COZAAR) 50 MG tablet Take 1 tablet (50 mg total) by mouth daily. 90 tablet 3   metFORMIN (GLUCOPHAGE) 1000 MG tablet Take 1 tablet (1,000 mg total) by mouth 2 (two) times daily with a meal. 180 tablet 3   pravastatin (PRAVACHOL) 80 MG tablet Take 1 tablet (80 mg total) by mouth daily. 90 tablet 3   Saw Palmetto 500 MG CAPS Take 1 capsule (500 mg total) by mouth daily.  0   tamsulosin (FLOMAX) 0.4 MG CAPS capsule TAKE 1 CAPSULE BY MOUTH EVERY DAY 90 capsule 1   No facility-administered medications prior to visit.     Per HPI unless specifically indicated in ROS section below Review of Systems  Objective:  BP 136/70 (BP Location: Left Arm, Patient Position: Sitting)   Pulse 76   Temp 97.7 F (36.5 C) (Skin)   Ht 5' 6.5" (1.689 m)   Wt 161 lb (73 kg)  SpO2 99%   BMI 25.60 kg/m   Wt Readings from Last 3 Encounters:  07/12/22 161 lb (73 kg)  01/06/22 156 lb 8 oz (71 kg)  12/31/21 158 lb (71.7 kg)      Physical Exam Vitals and nursing note reviewed.  Constitutional:      Appearance: Normal appearance. He is not ill-appearing.  HENT:     Head: Normocephalic and atraumatic.     Mouth/Throat:     Mouth: Mucous membranes are moist.     Pharynx: Oropharynx is clear. No oropharyngeal exudate or posterior oropharyngeal erythema.  Eyes:     Extraocular Movements: Extraocular movements intact.     Conjunctiva/sclera: Conjunctivae normal.     Pupils: Pupils are equal, round, and reactive to light.  Cardiovascular:     Rate and  Rhythm: Normal rate and regular rhythm.     Pulses: Normal pulses.     Heart sounds: Normal heart sounds. No murmur heard. Pulmonary:     Effort: Pulmonary effort is normal. No respiratory distress.     Breath sounds: Normal breath sounds. No wheezing, rhonchi or rales.  Musculoskeletal:     Right lower leg: No edema.     Left lower leg: No edema.     Comments: See HPI for foot exam if done  Skin:    General: Skin is warm and dry.     Findings: No rash.  Neurological:     Mental Status: He is alert.  Psychiatric:        Mood and Affect: Mood normal.        Behavior: Behavior normal.       Results for orders placed or performed in visit on 07/12/22  POCT glycosylated hemoglobin (Hb A1C)  Result Value Ref Range   Hemoglobin A1C 5.6 4.0 - 5.6 %   HbA1c POC (<> result, manual entry)     HbA1c, POC (prediabetic range)     HbA1c, POC (controlled diabetic range)     Lab Results  Component Value Date   CREATININE 1.30 12/30/2021   BUN 25 (H) 12/30/2021   NA 132 (L) 12/30/2021   K 4.5 12/30/2021   CL 100 12/30/2021   CO2 24 12/30/2021   Lab Results  Component Value Date   TSH 0.54 02/27/2013    Assessment & Plan:   Problem List Items Addressed This Visit       Unprioritized   Type 2 diabetes mellitus with neurological manifestations, controlled ([facility]) - Primary    Chronic, stable on current regimen - advised to always take glipizide with food. Update renal panel, if Cr remains elevated, will drop metformin dose.       Relevant Orders   POCT glycosylated hemoglobin (Hb A1C) (Completed)   TSH   Renal function panel   PAD (peripheral artery disease) (HCC)    Good pedal pulses today.       CKD stage 3 due to type 2 diabetes mellitus (HCC)    Update renal panel as per below.       Relevant Orders   TSH   Renal function panel   VITAMIN D 25 Hydroxy (Vit-D Deficiency, Fractures)   Other Visit Diagnoses     Need for influenza vaccination       Relevant Orders    Flu Vaccine QUAD High Dose(Fluad) (Completed)        No orders of the defined types were placed in this encounter.  Orders Placed This Encounter  Procedures   Flu  Vaccine QUAD High Dose(Fluad)   TSH   Renal function panel   VITAMIN D 25 Hydroxy (Vit-D Deficiency, Fractures)   POCT glycosylated hemoglobin (Hb A1C)     Patient Instructions  Flu shot today  Labs today. We will be in touch with results.  Schedule yearly diabetic eye exam.  You are doing well today.  Return in 6 months for physical/wellness visit.   Follow up plan: Return in about 6 months (around 01/10/2023) for annual exam, prior fasting for blood work, medicare wellness visit.  Ria Bush, MD

## 2022-07-12 NOTE — Assessment & Plan Note (Signed)
Good pedal pulses today.

## 2022-07-12 NOTE — Assessment & Plan Note (Addendum)
Chronic, stable on current regimen - advised to always take glipizide with food. Update renal panel, if Cr remains elevated, will drop metformin dose.

## 2022-07-13 ENCOUNTER — Telehealth: Payer: Self-pay

## 2022-07-13 ENCOUNTER — Other Ambulatory Visit: Payer: Self-pay | Admitting: Family Medicine

## 2022-07-13 MED ORDER — METFORMIN HCL 500 MG PO TABS: 500.0000 mg | ORAL_TABLET | Freq: Two times a day (BID) | ORAL | 1 refills | Status: DC

## 2022-07-13 NOTE — Telephone Encounter (Signed)
Informed patient of results and recommendations. 

## 2022-07-13 NOTE — Telephone Encounter (Signed)
-----   Message from Ria Bush, MD sent at 07/13/2022  7:47 AM EST ----- Plz notify kidney function remains impaired but stable.  Sugar was mildly elevated. Thyroid function, vitamin D level normal. I would like him to drop his metformin dose to '500mg'$  twice daily. New '500mg'$  tablet dose sent into pharmacy. Continue glipizide XL '5mg'$  daily with breakfast.

## 2022-10-15 ENCOUNTER — Other Ambulatory Visit: Payer: Self-pay | Admitting: Family Medicine

## 2022-10-15 DIAGNOSIS — E1149 Type 2 diabetes mellitus with other diabetic neurological complication: Secondary | ICD-10-CM

## 2022-11-29 DIAGNOSIS — H04123 Dry eye syndrome of bilateral lacrimal glands: Secondary | ICD-10-CM | POA: Diagnosis not present

## 2022-11-29 DIAGNOSIS — H524 Presbyopia: Secondary | ICD-10-CM | POA: Diagnosis not present

## 2022-11-29 DIAGNOSIS — Z961 Presence of intraocular lens: Secondary | ICD-10-CM | POA: Diagnosis not present

## 2022-11-29 DIAGNOSIS — H5203 Hypermetropia, bilateral: Secondary | ICD-10-CM | POA: Diagnosis not present

## 2022-11-29 DIAGNOSIS — Z135 Encounter for screening for eye and ear disorders: Secondary | ICD-10-CM | POA: Diagnosis not present

## 2022-11-29 DIAGNOSIS — H52223 Regular astigmatism, bilateral: Secondary | ICD-10-CM | POA: Diagnosis not present

## 2022-12-18 ENCOUNTER — Other Ambulatory Visit: Payer: Self-pay | Admitting: Family Medicine

## 2022-12-18 DIAGNOSIS — N183 Chronic kidney disease, stage 3 unspecified: Secondary | ICD-10-CM

## 2022-12-18 DIAGNOSIS — E1169 Type 2 diabetes mellitus with other specified complication: Secondary | ICD-10-CM

## 2022-12-18 DIAGNOSIS — E1149 Type 2 diabetes mellitus with other diabetic neurological complication: Secondary | ICD-10-CM

## 2022-12-24 ENCOUNTER — Other Ambulatory Visit (INDEPENDENT_AMBULATORY_CARE_PROVIDER_SITE_OTHER): Payer: Medicare HMO

## 2022-12-24 ENCOUNTER — Other Ambulatory Visit: Payer: Self-pay | Admitting: Family Medicine

## 2022-12-24 DIAGNOSIS — E785 Hyperlipidemia, unspecified: Secondary | ICD-10-CM

## 2022-12-24 DIAGNOSIS — E1169 Type 2 diabetes mellitus with other specified complication: Secondary | ICD-10-CM

## 2022-12-24 DIAGNOSIS — E1122 Type 2 diabetes mellitus with diabetic chronic kidney disease: Secondary | ICD-10-CM

## 2022-12-24 DIAGNOSIS — E1149 Type 2 diabetes mellitus with other diabetic neurological complication: Secondary | ICD-10-CM

## 2022-12-24 DIAGNOSIS — N183 Chronic kidney disease, stage 3 unspecified: Secondary | ICD-10-CM | POA: Diagnosis not present

## 2022-12-24 DIAGNOSIS — I10 Essential (primary) hypertension: Secondary | ICD-10-CM

## 2022-12-24 LAB — COMPREHENSIVE METABOLIC PANEL
ALT: 18 U/L (ref 0–53)
AST: 22 U/L (ref 0–37)
Albumin: 3.9 g/dL (ref 3.5–5.2)
Alkaline Phosphatase: 56 U/L (ref 39–117)
BUN: 25 mg/dL — ABNORMAL HIGH (ref 6–23)
CO2: 25 mEq/L (ref 19–32)
Calcium: 8.8 mg/dL (ref 8.4–10.5)
Chloride: 105 mEq/L (ref 96–112)
Creatinine, Ser: 1.4 mg/dL (ref 0.40–1.50)
GFR: 45.24 mL/min — ABNORMAL LOW (ref >60.00)
Glucose, Bld: 146 mg/dL — ABNORMAL HIGH (ref 70–99)
Potassium: 4.7 mEq/L (ref 3.5–5.1)
Sodium: 137 mEq/L (ref 135–145)
Total Bilirubin: 0.5 mg/dL (ref 0.2–1.2)
Total Protein: 6 g/dL (ref 6.0–8.3)

## 2022-12-24 LAB — MICROALBUMIN / CREATININE URINE RATIO
Creatinine,U: 76.2 mg/dL
Microalb Creat Ratio: 2.1 mg/g (ref 0.0–30.0)
Microalb, Ur: 1.6 mg/dL (ref 0.0–1.9)

## 2022-12-24 LAB — CBC WITH DIFFERENTIAL/PLATELET
Basophils Absolute: 0 10*3/uL (ref 0.0–0.1)
Basophils Relative: 0.6 % (ref 0.0–3.0)
Eosinophils Absolute: 0.1 10*3/uL (ref 0.0–0.7)
Eosinophils Relative: 1.8 % (ref 0.0–5.0)
HCT: 37 % — ABNORMAL LOW (ref 39.0–52.0)
Hemoglobin: 12.3 g/dL — ABNORMAL LOW (ref 13.0–17.0)
Lymphocytes Relative: 57.2 % — ABNORMAL HIGH (ref 12.0–46.0)
Lymphs Abs: 4.6 10*3/uL — ABNORMAL HIGH (ref 0.7–4.0)
MCHC: 33.4 g/dL (ref 30.0–36.0)
MCV: 94.4 fl (ref 78.0–100.0)
Monocytes Absolute: 0.7 10*3/uL (ref 0.1–1.0)
Monocytes Relative: 8.3 % (ref 3.0–12.0)
Neutro Abs: 2.6 10*3/uL (ref 1.4–7.7)
Neutrophils Relative %: 32.1 % — ABNORMAL LOW (ref 43.0–77.0)
Platelets: 214 10*3/uL (ref 150.0–400.0)
RBC: 3.92 Mil/uL — ABNORMAL LOW (ref 4.22–5.81)
RDW: 14.3 % (ref 11.5–15.5)
WBC: 8.1 10*3/uL (ref 4.0–10.5)

## 2022-12-24 LAB — PHOSPHORUS: Phosphorus: 3.5 mg/dL (ref 2.3–4.6)

## 2022-12-24 LAB — HEMOGLOBIN A1C: Hgb A1c MFr Bld: 6.4 % (ref 4.6–6.5)

## 2022-12-24 LAB — LIPID PANEL
Cholesterol: 147 mg/dL (ref 0–200)
HDL: 52 mg/dL (ref >39.00)
LDL Cholesterol: 83 mg/dL (ref 0–99)
NonHDL: 94.58
Total CHOL/HDL Ratio: 3
Triglycerides: 58 mg/dL (ref 0.0–149.0)
VLDL: 11.6 mg/dL (ref 0.0–40.0)

## 2022-12-24 LAB — VITAMIN D 25 HYDROXY (VIT D DEFICIENCY, FRACTURES): VITD: 25.92 ng/mL — ABNORMAL LOW (ref 30.00–100.00)

## 2022-12-25 LAB — PARATHYROID HORMONE, INTACT (NO CA): PTH: 94 pg/mL — ABNORMAL HIGH (ref 16–77)

## 2022-12-29 ENCOUNTER — Other Ambulatory Visit: Payer: Medicare HMO

## 2022-12-30 ENCOUNTER — Other Ambulatory Visit: Payer: Self-pay | Admitting: Family Medicine

## 2022-12-30 DIAGNOSIS — I10 Essential (primary) hypertension: Secondary | ICD-10-CM

## 2022-12-30 DIAGNOSIS — E1149 Type 2 diabetes mellitus with other diabetic neurological complication: Secondary | ICD-10-CM

## 2022-12-30 DIAGNOSIS — N400 Enlarged prostate without lower urinary tract symptoms: Secondary | ICD-10-CM

## 2023-01-04 ENCOUNTER — Ambulatory Visit (INDEPENDENT_AMBULATORY_CARE_PROVIDER_SITE_OTHER): Payer: Medicare HMO

## 2023-01-04 VITALS — Ht 67.0 in | Wt 165.0 lb

## 2023-01-04 DIAGNOSIS — Z0000 Encounter for general adult medical examination without abnormal findings: Secondary | ICD-10-CM | POA: Diagnosis not present

## 2023-01-04 NOTE — Progress Notes (Signed)
I connected with  John Sanford on 01/04/23 by a audio enabled telemedicine application and verified that I am speaking with the correct person using two identifiers.  Patient Location: Home  Provider Location: Home Office  I discussed the limitations of evaluation and management by telemedicine. The patient expressed understanding and agreed to proceed.  Subjective:   John Sanford is a 89 y.o. male who presents for Medicare Annual/Subsequent preventive examination.  Review of Systems      Cardiac Risk Factors include: advanced age (>62men, >79 women);hypertension;diabetes mellitus;male gender;smoking/ tobacco exposure     Objective:    Today's Vitals   01/04/23 1029  Weight: 165 lb (74.8 kg)  Height: 5\' 7"  (1.702 m)   Body mass index is 25.84 kg/m.     01/04/2023   10:43 AM 12/31/2021   11:20 AM 12/30/2020   11:22 AM 08/08/2020   12:00 PM 07/30/2020    5:09 AM 07/29/2020   10:10 PM 07/25/2020   11:16 AM  Advanced Directives  Does Patient Have a Medical Advance Directive? Yes Yes Yes Yes  No Yes  Type of Estate agent of [place];Living will Healthcare Power of [place];Living will Healthcare Power of [place];Living will Living will   Healthcare Power of [place];Living will  Does patient want to make changes to medical advance directive? No - Patient declined Yes (Inpatient - patient defers changing a medical advance directive and declines information at this time)       Copy of Healthcare Power of Attorney in Chart? Yes - validated most recent copy scanned in chart (See row information) Yes - validated most recent copy scanned in chart (See row information) No - copy requested    No - copy requested  Would patient like information on creating a medical advance directive?     No - Patient declined      Current Medications (verified) Outpatient Encounter Medications as of 01/04/2023  Medication Sig   amLODipine (NORVASC) 5 MG tablet TAKE 1  TABLET (5 MG TOTAL) BY MOUTH DAILY.   aspirin 81 MG tablet Take 1 tablet (81 mg total) by mouth 2 (two) times daily after a meal.   docusate sodium (COLACE) 100 MG capsule Take 100 mg by mouth daily. OTC   glipiZIDE (GLUCOTROL XL) 5 MG 24 hr tablet TAKE 1 TABLET BY MOUTH EVERY DAY WITH BREAKFAST   glucose blood test strip Use as instructed to check blood sugar once daily.   Lancets (ONETOUCH DELICA PLUS LANCET33G) MISC Use as instructed to check blood sugar once daily.   losartan (COZAAR) 50 MG tablet TAKE 1 TABLET BY MOUTH EVERY DAY   metFORMIN (GLUCOPHAGE) 500 MG tablet TAKE 1 TABLET BY MOUTH 2 TIMES DAILY WITH A MEAL.   pravastatin (PRAVACHOL) 80 MG tablet Take 1 tablet (80 mg total) by mouth daily.   tamsulosin (FLOMAX) 0.4 MG CAPS capsule TAKE 1 CAPSULE BY MOUTH EVERY DAY   ketoconazole (NIZORAL) 2 % cream Apply 1 application. topically daily. (Patient not taking: Reported on 01/04/2023)   Saw Palmetto 500 MG CAPS Take 1 capsule (500 mg total) by mouth daily. (Patient not taking: Reported on 01/04/2023)   No facility-administered encounter medications on file as of 01/04/2023.    Allergies (verified) Patient has no known allergies.   History: Past Medical History:  Diagnosis Date   Allergy    Arthritis    BPH (benign prostatic hypertrophy)    Cancer (HCC)    hx of skin cancer  Diabetes mellitus    type 2    Hyperlipidemia    Hypertension    Pneumonia    hx of    Past Surgical History:  Procedure Laterality Date   CATARACT EXTRACTION W/ INTRAOCULAR LENS IMPLANT     COLONOSCOPY     PROSTATE BIOPSY     negative   TONSILLECTOMY AND ADENOIDECTOMY     TOTAL HIP ARTHROPLASTY Left 07/29/2020   Procedure: LEFT TOTAL HIP ARTHROPLASTY ANTERIOR APPROACH;  Surgeon: Marcene Corning, MD;  Location: [facility];  Service: Orthopedics;  Laterality: Left;   VENTRAL HERNIA REPAIR     periumbilical   Family History  Problem Relation Age of Onset   Heart disease Mother    Diabetes Father     Heart disease Father    Diabetes Brother    Heart disease Brother    Pancreatic cancer Brother    Social History   Socioeconomic History   Marital status: Widowed    Spouse name: Not on file   Number of children: 3   Years of education: 53   Highest education level: Not on file  Occupational History   Occupation: retired Naval architect  Tobacco Use   Smoking status: Former    Packs/day: 2.00    Years: 20.00    Additional pack years: 0.00    Total pack years: 40.00    Types: Cigarettes    Quit date: 08/16/1972    Years since quitting: 50.4   Smokeless tobacco: Never  Vaping Use   Vaping Use: Never used  Substance and Sexual Activity   Alcohol use: No    Comment: rare   Drug use: No   Sexual activity: Not on file  Other Topics Concern   Not on file  Social History Narrative   Has living will.    Has health care POA---daughter then son   Would accept resuscitation--but no prolonged artificial ventilation   Would not want feeding tube if cognitively unaware   Widower - wife deceased 12-30-06    Daughter lives nearby, son in [place] in [place]    Social Determinants of Health   Financial Resource Strain: Low Risk  (01/04/2023)   Overall Financial Resource Strain (CARDIA)    Difficulty of Paying Living Expenses: Not hard at all  Food Insecurity: No Food Insecurity (01/04/2023)   Hunger Vital Sign    Worried About Running Out of Food in the Last Year: Never true    Ran Out of Food in the Last Year: Never true  Transportation Needs: No Transportation Needs (01/04/2023)   PRAPARE - Administrator, Civil Service (Medical): No    Lack of Transportation (Non-Medical): No  Physical Activity: Inactive (01/04/2023)   Exercise Vital Sign    Days of Exercise per Week: 0 days    Minutes of Exercise per Session: 0 min  Stress: No Stress Concern Present (01/04/2023)   [employer] of Occupational Health - Occupational Stress Questionnaire    Feeling of  Stress : Not at all  Social Connections: Moderately Isolated (01/04/2023)   Social Connection and Isolation Panel [NHANES]    Frequency of Communication with Friends and Family: More than three times a week    Frequency of Social Gatherings with Friends and Family: More than three times a week    Attends Religious Services: 1 to 4 times per year    Active Member of [employer] or Organizations: No    Attends Banker Meetings:  Never    Marital Status: Widowed    Tobacco Counseling Counseling given: Not Answered   Clinical Intake:  Pre-visit preparation completed: Yes  Pain : No/denies pain     Nutritional Risks: None Diabetes: Yes CBG done?: No Did pt. bring in CBG monitor from home?: No  How often do you need to have someone help you when you read instructions, pamphlets, or other written materials from your doctor or pharmacy?: 1 - Never  Diabetic?Nutrition Risk Assessment:  Has the patient had any N/V/D within the last 2 months?  No  Does the patient have any non-healing wounds?  No  Has the patient had any unintentional weight loss or weight gain?  No   Diabetes:  Is the patient diabetic?  Yes  If diabetic, was a CBG obtained today?  No  Did the patient bring in their glucometer from home?  No  How often do you monitor your CBG's? Does not check.   Financial Strains and Diabetes Management:  Are you having any financial strains with the device, your supplies or your medication? No .  Does the patient want to be seen by Chronic Care Management for management of their diabetes?  No  Would the patient like to be referred to a Nutritionist or for Diabetic Management?  No   Diabetic Exams:  Diabetic Eye Exam: Completed 11/2022 Dr.McFarland Diabetic Foot Exam: Completed 07/12/22 PCP    Interpreter Needed?: No  Information entered by :: C.Melchizedek Espinola LPN   Activities of Daily Living    01/04/2023   10:44 AM  In your present state of health, do you have any  difficulty performing the following activities:  Hearing? 0  Vision? 0  Difficulty concentrating or making decisions? 1  Comment occasionally  Walking or climbing stairs? 0  Dressing or bathing? 0  Doing errands, shopping? 0  Preparing Food and eating ? N  Using the Toilet? N  In the past six months, have you accidently leaked urine? N  Do you have problems with loss of bowel control? N  Managing your Medications? N  Managing your Finances? N  Housekeeping or managing your Housekeeping? N    Patient Care Team: Eustaquio Boyden, MD as PCP - General (Family Medicine)  Indicate any recent Medical Services you may have received from other than [facility] providers in the past year (date may be approximate).     Assessment:   This is a routine wellness examination for John Sanford.  Hearing/Vision screen Hearing Screening - Comments:: No aids Vision Screening - Comments:: Glasses - Dr.McFarland  Dietary issues and exercise activities discussed: Current Exercise Habits: The patient does not participate in regular exercise at present, Exercise limited by: None identified   Goals Addressed             This Visit's Progress    Patient Stated       No new goals       Depression Screen    01/04/2023   10:42 AM 12/31/2021   11:15 AM 12/30/2020   11:25 AM 05/26/2020   10:02 AM 04/25/2019   12:16 PM 04/25/2019   11:30 AM 11/15/2017   10:11 AM  PHQ 2/9 Scores  PHQ - 2 Score 0 0 0 0 0 0 0  PHQ- 9 Score   0   0     Fall Risk    01/04/2023   10:34 AM 12/31/2021   11:22 AM 12/30/2020   11:23 AM 05/26/2020   10:02 AM 04/25/2019  12:16 PM  Fall Risk   Falls in the past year? 0 0 0 0 0  Number falls in past yr: 0 0 0    Injury with Fall? 0 0 0    Risk for fall due to : No Fall Risks No Fall Risks Medication side effect    Follow up Falls prevention discussed;Falls evaluation completed Falls evaluation completed Falls evaluation completed;Falls prevention discussed      FALL RISK  PREVENTION PERTAINING TO THE HOME:  Any stairs in or around the home? Yes  If so, are there any without handrails? No  Home free of loose throw rugs in walkways, pet beds, electrical cords, etc? Yes  Adequate lighting in your home to reduce risk of falls? Yes   ASSISTIVE DEVICES UTILIZED TO PREVENT FALLS:  Life alert? No  Use of a cane, walker or w/c? No  Grab bars in the bathroom? Yes  Shower chair or bench in shower? Yes  Elevated toilet seat or a handicapped toilet? Yes   Cognitive Function:    12/30/2020   11:28 AM  MMSE - Mini Mental State Exam  Not completed: Refused        01/04/2023   10:45 AM  6CIT Screen  What Year? 0 points  What month? 0 points  What time? 0 points  Count back from 20 0 points  Months in reverse 0 points  Repeat phrase 2 points  Total Score 2 points    Immunizations Immunization History  Administered Date(s) Administered   Fluad Quad(high Dose 65+) 04/25/2019, 05/21/2020, 07/12/2022   Influenza Split 07/27/2011   Influenza Whole 07/26/2007, 06/13/2008, 07/25/2009, 07/24/2010   Influenza, Seasonal, Injecte, Preservative Fre 08/30/2012   Influenza,inj,Quad PF,6+ Mos 05/28/2013, 09/20/2014, 04/21/2016, 05/10/2017, 05/17/2018   PFIZER(Purple Top)SARS-COV-2 Vaccination 09/11/2019, 10/02/2019   Pneumococcal Conjugate-13 03/12/2014   Pneumococcal Polysaccharide-23 07/24/2010, 11/15/2017   Td 02/13/2006, 01/15/2011   Zoster Recombinat (Shingrix) 01/06/2022   Zoster, Live 09/24/2013    TDAP status: Due, Education has been provided regarding the importance of this vaccine. Advised may receive this vaccine at local pharmacy or Health Dept. Aware to provide a copy of the vaccination record if obtained from local pharmacy or Health Dept. Verbalized acceptance and understanding.  Flu Vaccine status: Up to date  Pneumococcal vaccine status: Up to date  Covid-19 vaccine status: Information provided on how to obtain vaccines.   Qualifies for  Shingles Vaccine? Yes   Zostavax completed Yes   Shingrix Completed?: No.    Education has been provided regarding the importance of this vaccine. Patient has been advised to call insurance company to determine out of pocket expense if they have not yet received this vaccine. Advised may also receive vaccine at local pharmacy or Health Dept. Verbalized acceptance and understanding.  Screening Tests Health Maintenance  Topic Date Due   COVID-19 Vaccine (3 - Pfizer risk series) 10/30/2019   DTaP/Tdap/Td (3 - Tdap) 01/14/2021   OPHTHALMOLOGY EXAM  04/28/2021   Zoster Vaccines- Shingrix (2 of 2) 03/03/2022   INFLUENZA VACCINE  03/17/2023   HEMOGLOBIN A1C  06/26/2023   FOOT EXAM  07/13/2023   Medicare Annual Wellness (AWV)  01/04/2024   Pneumonia Vaccine 64+ Years old  Completed   HPV VACCINES  Aged Out    Health Maintenance  Health Maintenance Due  Topic Date Due   COVID-19 Vaccine (3 - Pfizer risk series) 10/30/2019   DTaP/Tdap/Td (3 - Tdap) 01/14/2021   OPHTHALMOLOGY EXAM  04/28/2021   Zoster Vaccines- Shingrix (2  of 2) 03/03/2022    Colorectal cancer screening: No longer required.   Lung Cancer Screening: (Low Dose CT Chest recommended if Age 36-80 years, 30 pack-year currently smoking OR have quit w/in 15years.) does not qualify.   Lung Cancer Screening Referral: no  Additional Screening:  Hepatitis C Screening: does not qualify; Completed no  Vision Screening: Recommended annual ophthalmology exams for early detection of glaucoma and other disorders of the eye. Is the patient up to date with their annual eye exam?  Yes  Who is the provider or what is the name of the office in which the patient attends annual eye exams? Dr.McFarland If pt is not established with a provider, would they like to be referred to a provider to establish care? No .   Dental Screening: Recommended annual dental exams for proper oral hygiene  Community Resource Referral / Chronic Care  Management: CRR required this visit?  No   CCM required this visit?  No      Plan:     I have personally reviewed and noted the following in the patient's chart:   Medical and social history Use of alcohol, tobacco or illicit drugs  Current medications and supplements including opioid prescriptions. Patient is not currently taking opioid prescriptions. Functional ability and status Nutritional status Physical activity Advanced directives List of other physicians Hospitalizations, surgeries, and ER visits in previous 12 months Vitals Screenings to include cognitive, depression, and falls Referrals and appointments  In addition, I have reviewed and discussed with patient certain preventive protocols, quality metrics, and best practice recommendations. A written personalized care plan for preventive services as well as general preventive health recommendations were provided to patient.     Maryan Puls, LPN   1/61/0960   Nurse Notes: none

## 2023-01-04 NOTE — Patient Instructions (Signed)
[name] , Thank you for taking time to come for your Medicare Wellness Visit. I appreciate your ongoing commitment to your health goals. Please review the following plan we discussed and let me know if I can assist you in the future.   These are the goals we discussed:  Goals      DIET - EAT MORE FRUITS AND VEGETABLES     Patient Stated     12/30/2020, I will maintain and continue medications as prescribed.      Patient Stated     No new goals        This is a list of the screening recommended for you and due dates:  Health Maintenance  Topic Date Due   COVID-19 Vaccine (3 - Pfizer risk series) 10/30/2019   DTaP/Tdap/Td vaccine (3 - Tdap) 01/14/2021   Eye exam for diabetics  04/28/2021   Zoster (Shingles) Vaccine (2 of 2) 03/03/2022   Flu Shot  03/17/2023   Hemoglobin A1C  06/26/2023   Complete foot exam   07/13/2023   Medicare Annual Wellness Visit  01/04/2024   Pneumonia Vaccine  Completed   HPV Vaccine  Aged Out    Advanced directives: in chart  Conditions/risks identified: Aim for 30 minutes of exercise or brisk walking, 6-8 glasses of water, and 5 servings of fruits and vegetables each day.   Next appointment: Follow up in one year for your annual wellness visit. 01/05/24 @10 :15 televisit  Preventive Care 65 Years and Older, Male  Preventive care refers to lifestyle choices and visits with your health care provider that can promote health and wellness. What does preventive care include? A yearly physical exam. This is also called an annual well check. Dental exams once or twice a year. Routine eye exams. Ask your health care provider how often you should have your eyes checked. Personal lifestyle choices, including: Daily care of your teeth and gums. Regular physical activity. Eating a healthy diet. Avoiding tobacco and drug use. Limiting alcohol use. Practicing safe sex. Taking low doses of aspirin every day. Taking vitamin and mineral supplements as  recommended by your health care provider. What happens during an annual well check? The services and screenings done by your health care provider during your annual well check will depend on your age, overall health, lifestyle risk factors, and family history of disease. Counseling  Your health care provider may ask you questions about your: Alcohol use. Tobacco use. Drug use. Emotional well-being. Home and relationship well-being. Sexual activity. Eating habits. History of falls. Memory and ability to understand (cognition). Work and work Astronomer. Screening  You may have the following tests or measurements: Height, weight, and BMI. Blood pressure. Lipid and cholesterol levels. These may be checked every 5 years, or more frequently if you are over 87 years old. Skin check. Lung cancer screening. You may have this screening every year starting at age 27 if you have a 30-pack-year history of smoking and currently smoke or have quit within the past 15 years. Fecal occult blood test (FOBT) of the stool. You may have this test every year starting at age 37. Flexible sigmoidoscopy or colonoscopy. You may have a sigmoidoscopy every 5 years or a colonoscopy every 10 years starting at age 22. Prostate cancer screening. Recommendations will vary depending on your family history and other risks. Hepatitis C blood test. Hepatitis B blood test. Sexually transmitted disease (STD) testing. Diabetes screening. This is done by checking your blood sugar (glucose) after you have not  eaten for a while (fasting). You may have this done every 1-3 years. Abdominal aortic aneurysm (AAA) screening. You may need this if you are a current or former smoker. Osteoporosis. You may be screened starting at age 21 if you are at high risk. Talk with your health care provider about your test results, treatment options, and if necessary, the need for more tests. Vaccines  Your health care provider may recommend  certain vaccines, such as: Influenza vaccine. This is recommended every year. Tetanus, diphtheria, and acellular pertussis (Tdap, Td) vaccine. You may need a Td booster every 10 years. Zoster vaccine. You may need this after age 14. Pneumococcal 13-valent conjugate (PCV13) vaccine. One dose is recommended after age 2. Pneumococcal polysaccharide (PPSV23) vaccine. One dose is recommended after age 60. Talk to your health care provider about which screenings and vaccines you need and how often you need them. This information is not intended to replace advice given to you by your health care provider. Make sure you discuss any questions you have with your health care provider. Document Released: 08/29/2015 Document Revised: 04/21/2016 Document Reviewed: 06/03/2015 Elsevier Interactive Patient Education  2017 [employer].  Fall Prevention in the Home Falls can cause injuries. They can happen to people of all ages. There are many things you can do to make your home safe and to help prevent falls. What can I do on the outside of my home? Regularly fix the edges of walkways and driveways and fix any cracks. Remove anything that might make you trip as you walk through a door, such as a raised step or threshold. Trim any bushes or trees on the path to your home. Use bright outdoor lighting. Clear any walking paths of anything that might make someone trip, such as rocks or tools. Regularly check to see if handrails are loose or broken. Make sure that both sides of any steps have handrails. Any raised decks and porches should have guardrails on the edges. Have any leaves, snow, or ice cleared regularly. Use sand or salt on walking paths during winter. Clean up any spills in your garage right away. This includes oil or grease spills. What can I do in the bathroom? Use night lights. Install grab bars by the toilet and in the tub and shower. Do not use towel bars as grab bars. Use non-skid mats or  decals in the tub or shower. If you need to sit down in the shower, use a plastic, non-slip stool. Keep the floor dry. Clean up any water that spills on the floor as soon as it happens. Remove soap buildup in the tub or shower regularly. Attach bath mats securely with double-sided non-slip rug tape. Do not have throw rugs and other things on the floor that can make you trip. What can I do in the bedroom? Use night lights. Make sure that you have a light by your bed that is easy to reach. Do not use any sheets or blankets that are too big for your bed. They should not hang down onto the floor. Have a firm chair that has side arms. You can use this for support while you get dressed. Do not have throw rugs and other things on the floor that can make you trip. What can I do in the kitchen? Clean up any spills right away. Avoid walking on wet floors. Keep items that you use a lot in easy-to-reach places. If you need to reach something above you, use a strong step stool that  has a grab bar. Keep electrical cords out of the way. Do not use floor polish or wax that makes floors slippery. If you must use wax, use non-skid floor wax. Do not have throw rugs and other things on the floor that can make you trip. What can I do with my stairs? Do not leave any items on the stairs. Make sure that there are handrails on both sides of the stairs and use them. Fix handrails that are broken or loose. Make sure that handrails are as long as the stairways. Check any carpeting to make sure that it is firmly attached to the stairs. Fix any carpet that is loose or worn. Avoid having throw rugs at the top or bottom of the stairs. If you do have throw rugs, attach them to the floor with carpet tape. Make sure that you have a light switch at the top of the stairs and the bottom of the stairs. If you do not have them, ask someone to add them for you. What else can I do to help prevent falls? Wear shoes that: Do not  have high heels. Have rubber bottoms. Are comfortable and fit you well. Are closed at the toe. Do not wear sandals. If you use a stepladder: Make sure that it is fully opened. Do not climb a closed stepladder. Make sure that both sides of the stepladder are locked into place. Ask someone to hold it for you, if possible. Clearly mark and make sure that you can see: Any grab bars or handrails. First and last steps. Where the edge of each step is. Use tools that help you move around (mobility aids) if they are needed. These include: Canes. Walkers. Scooters. Crutches. Turn on the lights when you go into a dark area. Replace any light bulbs as soon as they burn out. Set up your furniture so you have a clear path. Avoid moving your furniture around. If any of your floors are uneven, fix them. If there are any pets around you, be aware of where they are. Review your medicines with your doctor. Some medicines can make you feel dizzy. This can increase your chance of falling. Ask your doctor what other things that you can do to help prevent falls. This information is not intended to replace advice given to you by your health care provider. Make sure you discuss any questions you have with your health care provider. Document Released: 05/29/2009 Document Revised: 01/08/2016 Document Reviewed: 09/06/2014 Elsevier Interactive Patient Education  2017 [employer].

## 2023-01-05 ENCOUNTER — Encounter: Payer: Self-pay | Admitting: Family Medicine

## 2023-01-05 ENCOUNTER — Ambulatory Visit (INDEPENDENT_AMBULATORY_CARE_PROVIDER_SITE_OTHER): Payer: Medicare HMO | Admitting: Family Medicine

## 2023-01-05 VITALS — BP 142/62 | HR 80 | Temp 97.3°F | Ht 66.25 in | Wt 160.5 lb

## 2023-01-05 DIAGNOSIS — R0989 Other specified symptoms and signs involving the circulatory and respiratory systems: Secondary | ICD-10-CM | POA: Diagnosis not present

## 2023-01-05 DIAGNOSIS — Z7984 Long term (current) use of oral hypoglycemic drugs: Secondary | ICD-10-CM | POA: Diagnosis not present

## 2023-01-05 DIAGNOSIS — E1122 Type 2 diabetes mellitus with diabetic chronic kidney disease: Secondary | ICD-10-CM | POA: Diagnosis not present

## 2023-01-05 DIAGNOSIS — E1149 Type 2 diabetes mellitus with other diabetic neurological complication: Secondary | ICD-10-CM

## 2023-01-05 DIAGNOSIS — N400 Enlarged prostate without lower urinary tract symptoms: Secondary | ICD-10-CM

## 2023-01-05 DIAGNOSIS — D72820 Lymphocytosis (symptomatic): Secondary | ICD-10-CM | POA: Diagnosis not present

## 2023-01-05 DIAGNOSIS — I10 Essential (primary) hypertension: Secondary | ICD-10-CM

## 2023-01-05 DIAGNOSIS — E1169 Type 2 diabetes mellitus with other specified complication: Secondary | ICD-10-CM | POA: Diagnosis not present

## 2023-01-05 DIAGNOSIS — E785 Hyperlipidemia, unspecified: Secondary | ICD-10-CM | POA: Diagnosis not present

## 2023-01-05 DIAGNOSIS — I739 Peripheral vascular disease, unspecified: Secondary | ICD-10-CM | POA: Diagnosis not present

## 2023-01-05 DIAGNOSIS — K5900 Constipation, unspecified: Secondary | ICD-10-CM

## 2023-01-05 DIAGNOSIS — N2581 Secondary hyperparathyroidism of renal origin: Secondary | ICD-10-CM

## 2023-01-05 DIAGNOSIS — E559 Vitamin D deficiency, unspecified: Secondary | ICD-10-CM | POA: Diagnosis not present

## 2023-01-05 DIAGNOSIS — N183 Chronic kidney disease, stage 3 unspecified: Secondary | ICD-10-CM | POA: Diagnosis not present

## 2023-01-05 LAB — CBC WITH DIFFERENTIAL/PLATELET
Basophils Absolute: 39 cells/uL (ref 0–200)
Lymphs Abs: 3523 cells/uL (ref 850–3900)
Total Lymphocyte: 54.2 %

## 2023-01-05 LAB — SEDIMENTATION RATE: Sed Rate: 9 mm/hr (ref 0–20)

## 2023-01-05 MED ORDER — ASPIRIN 81 MG PO TBEC: 81.0000 mg | DELAYED_RELEASE_TABLET | Freq: Every day | ORAL | 12 refills | Status: DC

## 2023-01-05 MED ORDER — GLIPIZIDE ER 5 MG PO TB24: 5.0000 mg | ORAL_TABLET | Freq: Every day | ORAL | 4 refills | Status: DC

## 2023-01-05 MED ORDER — METFORMIN HCL 500 MG PO TABS: 500.0000 mg | ORAL_TABLET | Freq: Two times a day (BID) | ORAL | 4 refills | Status: DC

## 2023-01-05 MED ORDER — POLYETHYLENE GLYCOL 3350 17 GM/SCOOP PO POWD: 8.5000 g | Freq: Every day | ORAL | 1 refills | Status: AC | PRN

## 2023-01-05 MED ORDER — VITAMIN D3 25 MCG (1000 UT) PO CAPS: 1.0000 | ORAL_CAPSULE | Freq: Every day | ORAL | 4 refills | Status: DC

## 2023-01-05 MED ORDER — PRAVASTATIN SODIUM 80 MG PO TABS: 80.0000 mg | ORAL_TABLET | Freq: Every day | ORAL | 4 refills | Status: DC

## 2023-01-05 MED ORDER — TAMSULOSIN HCL 0.4 MG PO CAPS: 0.4000 mg | ORAL_CAPSULE | Freq: Every day | ORAL | 4 refills | Status: DC

## 2023-01-05 MED ORDER — LOSARTAN POTASSIUM 50 MG PO TABS: 50.0000 mg | ORAL_TABLET | Freq: Every day | ORAL | 4 refills | Status: DC

## 2023-01-05 MED ORDER — AMLODIPINE BESYLATE 5 MG PO TABS: 5.0000 mg | ORAL_TABLET | Freq: Every day | ORAL | 4 refills | Status: DC

## 2023-01-05 NOTE — Assessment & Plan Note (Addendum)
Heard today. Continue aspirin, statin, tight BP and sugar control.

## 2023-01-05 NOTE — Addendum Note (Signed)
Addended by: Donnamarie Poag on: 01/05/2023 11:38 AM   Modules accepted: Orders

## 2023-01-05 NOTE — Progress Notes (Signed)
Ph: [phone] Fax: [phone]   Patient ID: John Sanford, male    DOB: 1933-06-25, 89 y.o.   MRN: [id number]  This visit was conducted in person.  BP (!) 142/62   Pulse 80   Temp (!) 97.3 F (36.3 C) (Temporal)   Ht 5' 6.25" (1.683 m)   Wt 160 lb 8 oz (72.8 kg)   SpO2 98%   BMI 25.71 kg/m    CC: CPE - 2 days early for physical converted to follow up visit  Subjective:   HPI: John Sanford is a 89 y.o. male presenting on 01/05/2023 for Annual Exam (MCR prt 2 [AWV- 01/04/23]. Wants to discuss laxative packet. )   Saw health advisor yesterday for medicare wellness visit. Note reviewed.   No results found.  Flowsheet Row Clinical Support from 01/04/2023 in [facility]  PHQ-2 Total Score 0          01/04/2023   10:34 AM 12/31/2021   11:22 AM 12/30/2020   11:23 AM 05/26/2020   10:02 AM 04/25/2019   12:16 PM  Fall Risk   Falls in the past year? 0 0 0 0 0  Number falls in past yr: 0 0 0    Injury with Fall? 0 0 0    Risk for fall due to : No Fall Risks No Fall Risks Medication side effect    Follow up Falls prevention discussed;Falls evaluation completed Falls evaluation completed Falls evaluation completed;Falls prevention discussed     Decreased hearing to left since 1970s.   DM - continues glipizide XL 5mg  daily, metformin 500mg  BID with good effect. Lab Results  Component Value Date   HGBA1C 6.4 12/24/2022   Newly noted lymphocytosis - denies fevers/chills, weight loss or glandular enlargement. Once every few weeks wakes up with damp t-shirt but doesn't have to change clothes. Denies acute illness when he had recent labwork drawn 12/24/2022.   Preventative: Colon cancer screening - colonoscopy 2009 TA Russella Dar) - aged out. No blood in stool.  Prostate cancer screening - aged out. Started saw palmetto 500mg  once daily for nocturia Lung cancer screening - aged out Flu shot - yearly COVID vaccine Pfizer 08/2019, 09/2019,  no booster Td 2012 Pneumovax23 2011, 2019 prevnar-13 2015 Zostavax 2015  Shingrix - 12/2021, will check with pharmacy about 2nd dose Advanced directive discussion - Has living will scanned and in chart 10/2016. Has health care POA---daughter then son. Would accept resuscitation--but no prolonged artificial ventilation. Would not want feeding tube if cognitively unaware.  Seat belt use discussed.  Sunscreen use discussed. No changing moles on skin. Has seen derm for h/o skin cancer to back.  Ex smoker - quit remotely Alcohol - none Dentist - full dentures  Eye exam - yearly ([place]) Bowel - mild constipation to metformin, managed with colace and miralax with benefit Bladder - no incontinence   Widower - wife deceased Dec 21, 2006  Daughter lives nearby, son in [place] in [place]  Activity: tends to tomato, pepper, squash and okra plants yearly Diet: some water, fruits/vegetables daily      Relevant past medical, surgical, family and social history reviewed and updated as indicated. Interim medical history since our last visit reviewed. Allergies and medications reviewed and updated. Outpatient Medications Prior to Visit  Medication Sig Dispense Refill   docusate sodium (COLACE) 100 MG capsule Take 100 mg by mouth daily. OTC     Lancets (ONETOUCH DELICA PLUS LANCET33G) MISC Use  as instructed to check blood sugar once daily. 100 each 3   amLODipine (NORVASC) 5 MG tablet TAKE 1 TABLET (5 MG TOTAL) BY MOUTH DAILY. 90 tablet 0   aspirin 81 MG tablet Take 1 tablet (81 mg total) by mouth 2 (two) times daily after a meal. 60 tablet 0   glipiZIDE (GLUCOTROL XL) 5 MG 24 hr tablet TAKE 1 TABLET BY MOUTH EVERY DAY WITH BREAKFAST 90 tablet 0   glucose blood test strip Use as instructed to check blood sugar once daily. 100 each 3   ketoconazole (NIZORAL) 2 % cream Apply 1 application. topically daily. (Patient not taking: Reported on 01/04/2023)     losartan (COZAAR) 50 MG tablet TAKE 1 TABLET  BY MOUTH EVERY DAY 90 tablet 0   metFORMIN (GLUCOPHAGE) 500 MG tablet TAKE 1 TABLET BY MOUTH 2 TIMES DAILY WITH A MEAL. 180 tablet 0   pravastatin (PRAVACHOL) 80 MG tablet Take 1 tablet (80 mg total) by mouth daily. 90 tablet 3   Saw Palmetto 500 MG CAPS Take 1 capsule (500 mg total) by mouth daily. (Patient not taking: Reported on 01/04/2023)  0   tamsulosin (FLOMAX) 0.4 MG CAPS capsule TAKE 1 CAPSULE BY MOUTH EVERY DAY 90 capsule 0   No facility-administered medications prior to visit.     Per HPI unless specifically indicated in ROS section below Review of Systems  Objective:  BP (!) 142/62   Pulse 80   Temp (!) 97.3 F (36.3 C) (Temporal)   Ht 5' 6.25" (1.683 m)   Wt 160 lb 8 oz (72.8 kg)   SpO2 98%   BMI 25.71 kg/m   Wt Readings from Last 3 Encounters:  01/05/23 160 lb 8 oz (72.8 kg)  01/04/23 165 lb (74.8 kg)  07/12/22 161 lb (73 kg)      Physical Exam Vitals and nursing note reviewed.  Constitutional:      General: He is not in acute distress.    Appearance: Normal appearance. He is well-developed. He is not ill-appearing.  HENT:     Head: Normocephalic and atraumatic.     Right Ear: Hearing, tympanic membrane, ear canal and external ear normal.     Left Ear: Hearing, tympanic membrane, ear canal and external ear normal.     Nose: Nose normal.     Mouth/Throat:     Mouth: Mucous membranes are moist.     Pharynx: Oropharynx is clear. No oropharyngeal exudate or posterior oropharyngeal erythema.  Eyes:     General: No scleral icterus.    Extraocular Movements: Extraocular movements intact.     Conjunctiva/sclera: Conjunctivae normal.     Pupils: Pupils are equal, round, and reactive to light.  Neck:     Thyroid: No thyroid mass or thyromegaly.     Vascular: No carotid bruit.  Cardiovascular:     Rate and Rhythm: Normal rate and regular rhythm.     Pulses: Normal pulses.          Radial pulses are 2+ on the right side and 2+ on the left side.     Heart sounds:  Normal heart sounds. No murmur heard. Pulmonary:     Effort: Pulmonary effort is normal. No respiratory distress.     Breath sounds: Normal breath sounds. No wheezing, rhonchi or rales.  Abdominal:     General: Bowel sounds are normal. There is no distension.     Palpations: Abdomen is soft. There is no hepatomegaly, splenomegaly or mass.  Tenderness: There is no abdominal tenderness. There is no guarding or rebound. Negative signs include Murphy's sign.     Hernia: No hernia is present.     Comments: L sided abd bruit  Musculoskeletal:        General: Normal range of motion.     Cervical back: Normal range of motion and neck supple.     Right lower leg: No edema.     Left lower leg: No edema.  Lymphadenopathy:     Head:     Right side of head: No submental, submandibular, tonsillar, preauricular or posterior auricular adenopathy.     Left side of head: No submental, tonsillar, preauricular or posterior auricular adenopathy.     Cervical: No cervical adenopathy.     Upper Body:     Right upper body: No supraclavicular adenopathy.     Left upper body: No supraclavicular adenopathy.  Skin:    General: Skin is warm and dry.     Findings: No rash.  Neurological:     General: No focal deficit present.     Mental Status: He is alert and oriented to person, place, and time.  Psychiatric:        Mood and Affect: Mood normal.        Behavior: Behavior normal.        Thought Content: Thought content normal.        Judgment: Judgment normal.       Results for orders placed or performed in visit on 12/24/22  CBC with Differential/Platelet  Result Value Ref Range   WBC 8.1 4.0 - 10.5 K/uL   RBC 3.92 (L) 4.22 - 5.81 Mil/uL   Hemoglobin 12.3 (L) 13.0 - 17.0 g/dL   HCT 78.2 (L) 95.6 - 21.3 %   MCV 94.4 78.0 - 100.0 fl   MCHC 33.4 30.0 - 36.0 g/dL   RDW 08.6 57.8 - 46.9 %   Platelets 214.0 150.0 - 400.0 K/uL   Neutrophils Relative % 32.1 (L) 43.0 - 77.0 %   Lymphocytes Relative (H)  12.0 - 46.0 %    57.2 Manual smear review agrees with instrument differential.   Monocytes Relative 8.3 3.0 - 12.0 %   Eosinophils Relative 1.8 0.0 - 5.0 %   Basophils Relative 0.6 0.0 - 3.0 %   Neutro Abs 2.6 1.4 - 7.7 K/uL   Lymphs Abs 4.6 (H) 0.7 - 4.0 K/uL   Monocytes Absolute 0.7 0.1 - 1.0 K/uL   Eosinophils Absolute 0.1 0.0 - 0.7 K/uL   Basophils Absolute 0.0 0.0 - 0.1 K/uL  Parathyroid hormone, intact (no Ca)  Result Value Ref Range   PTH 94 (H) 16 - 77 pg/mL  Microalbumin / creatinine urine ratio  Result Value Ref Range   Microalb, Ur 1.6 0.0 - 1.9 mg/dL   Creatinine,U 62.9 mg/dL   Microalb Creat Ratio 2.1 0.0 - 30.0 mg/g  VITAMIN D 25 Hydroxy (Vit-D Deficiency, Fractures)  Result Value Ref Range   VITD 25.92 (L) 30.00 - 100.00 ng/mL  Phosphorus  Result Value Ref Range   Phosphorus 3.5 2.3 - 4.6 mg/dL  Comprehensive metabolic panel  Result Value Ref Range   Sodium 137 135 - 145 mEq/L   Potassium 4.7 3.5 - 5.1 mEq/L   Chloride 105 96 - 112 mEq/L   CO2 25 19 - 32 mEq/L   Glucose, Bld 146 (H) 70 - 99 mg/dL   BUN 25 (H) 6 - 23 mg/dL   Creatinine, Ser 5.28 0.40 -  1.50 mg/dL   Total Bilirubin 0.5 0.2 - 1.2 mg/dL   Alkaline Phosphatase 56 39 - 117 U/L   AST 22 0 - 37 U/L   ALT 18 0 - 53 U/L   Total Protein 6.0 6.0 - 8.3 g/dL   Albumin 3.9 3.5 - 5.2 g/dL   GFR 54.09 (L) >81.19 mL/min   Calcium 8.8 8.4 - 10.5 mg/dL  Lipid panel  Result Value Ref Range   Cholesterol 147 0 - 200 mg/dL   Triglycerides 14.7 0.0 - 149.0 mg/dL   HDL 82.95 >62.13 mg/dL   VLDL 08.6 0.0 - 57.8 mg/dL   LDL Cholesterol 83 0 - 99 mg/dL   Total CHOL/HDL Ratio 3    NonHDL 94.58   Hemoglobin A1c  Result Value Ref Range   Hgb A1c MFr Bld 6.4 4.6 - 6.5 %    Assessment & Plan:   Problem List Items Addressed This Visit     Type 2 diabetes mellitus with neurological manifestations, controlled (HCC)    Chronic, great control on current regimen - continue. No low sugars.       Relevant  Medications   aspirin EC 81 MG tablet   glipiZIDE (GLUCOTROL XL) 5 MG 24 hr tablet   losartan (COZAAR) 50 MG tablet   metFORMIN (GLUCOPHAGE) 500 MG tablet   pravastatin (PRAVACHOL) 80 MG tablet   Hyperlipidemia associated with type 2 diabetes mellitus (HCC)    Chronic, stable on pravastatin - continue. The ASCVD Risk score (Arnett DK, et al., 2019) failed to calculate for the following reasons:   The 2019 ASCVD risk score is only valid for ages 2 to 73       Relevant Medications   aspirin EC 81 MG tablet   amLODipine (NORVASC) 5 MG tablet   glipiZIDE (GLUCOTROL XL) 5 MG 24 hr tablet   losartan (COZAAR) 50 MG tablet   metFORMIN (GLUCOPHAGE) 500 MG tablet   pravastatin (PRAVACHOL) 80 MG tablet   Essential hypertension, benign    Chronic, stable. Continue current regimen.       Relevant Medications   aspirin EC 81 MG tablet   amLODipine (NORVASC) 5 MG tablet   losartan (COZAAR) 50 MG tablet   pravastatin (PRAVACHOL) 80 MG tablet   Constipation    He notes significant improvement with miralax - ok to continue this.       BPH without obstruction/lower urinary tract symptoms    Doing well on flomax daily.       Relevant Medications   tamsulosin (FLOMAX) 0.4 MG CAPS capsule   PAD (peripheral artery disease) (HCC)    Continue aspirin, statin       Relevant Medications   aspirin EC 81 MG tablet   amLODipine (NORVASC) 5 MG tablet   losartan (COZAAR) 50 MG tablet   pravastatin (PRAVACHOL) 80 MG tablet   CKD stage 3 due to type 2 diabetes mellitus (HCC)    Reviewed with patient, encouraged good hydration status. Check SPEP.       Relevant Medications   aspirin EC 81 MG tablet   glipiZIDE (GLUCOTROL XL) 5 MG 24 hr tablet   losartan (COZAAR) 50 MG tablet   metFORMIN (GLUCOPHAGE) 500 MG tablet   pravastatin (PRAVACHOL) 80 MG tablet   Lymphocytosis - Primary    Newly noted lymphocytes 57%, absolute lymphs 4.6 (H), with mild anemia.  Discussed possible causes including  blood disorder MM, leukemia, dysplasia.  Repeat labs today with periph smear, SPEP, ESR and flow cytometry.  Discussed if persistent abnormality, will refer to hematology. Pt agrees with plan.       Relevant Orders   CBC with Differential/Platelet   Pathologist smear review   LEUKEMIA/LYMPHOMA EVALUATION PANEL   Sedimentation rate   Serum protein electrophoresis with reflex   Abdominal bruit    Heard today. Continue aspirin, statin, tight BP and sugar control.       Vitamin D deficiency    Start vit D3 1000 IU daily.       Secondary hyperparathyroidism of renal origin (HCC)    Continue to monitor.         Meds ordered this encounter  Medications   polyethylene glycol powder (GLYCOLAX/MIRALAX) 17 GM/SCOOP powder    Sig: Take 8.5-17 g by mouth daily as needed for moderate constipation. (1/2-1 capful)    Dispense:  3350 g    Refill:  1   Cholecalciferol (VITAMIN D3) 25 MCG (1000 UT) CAPS    Sig: Take 1 capsule (1,000 Units total) by mouth daily.    Dispense:  90 capsule    Refill:  4   aspirin EC 81 MG tablet    Sig: Take 1 tablet (81 mg total) by mouth daily. Swallow whole.    Dispense:  30 tablet    Refill:  12   amLODipine (NORVASC) 5 MG tablet    Sig: Take 1 tablet (5 mg total) by mouth daily.    Dispense:  90 tablet    Refill:  4   glipiZIDE (GLUCOTROL XL) 5 MG 24 hr tablet    Sig: Take 1 tablet (5 mg total) by mouth daily with breakfast.    Dispense:  90 tablet    Refill:  4   losartan (COZAAR) 50 MG tablet    Sig: Take 1 tablet (50 mg total) by mouth daily.    Dispense:  90 tablet    Refill:  4   metFORMIN (GLUCOPHAGE) 500 MG tablet    Sig: Take 1 tablet (500 mg total) by mouth 2 (two) times daily with a meal.    Dispense:  180 tablet    Refill:  4   pravastatin (PRAVACHOL) 80 MG tablet    Sig: Take 1 tablet (80 mg total) by mouth daily.    Dispense:  90 tablet    Refill:  4   tamsulosin (FLOMAX) 0.4 MG CAPS capsule    Sig: Take 1 capsule (0.4 mg total)  by mouth daily.    Dispense:  90 capsule    Refill:  4    Orders Placed This Encounter  Procedures   CBC with Differential/Platelet   Pathologist smear review   LEUKEMIA/LYMPHOMA EVALUATION PANEL   Sedimentation rate   Serum protein electrophoresis with reflex    Patient Instructions  Ok to take miralax as needed for constipation. I've sent this in to the pharmacy for you.  Check with pharmacy about need for 2nd shingles shot.  Start vitamin D3 1000 units daily - over the counter.  One blood test (lymphocytes) returned high - repeat labs today. Pending results we may refer you to the blood doctor.  Return as needed or in 6 months for diabetes follow up visit.   Follow up plan: Return in about 6 months (around 07/08/2023) for follow up visit.  Eustaquio Boyden, MD

## 2023-01-05 NOTE — Assessment & Plan Note (Signed)
Chronic, stable on pravastatin - continue. The ASCVD Risk score (Arnett DK, et al., 2019) failed to calculate for the following reasons:   The 2019 ASCVD risk score is only valid for ages 40 to 79  

## 2023-01-05 NOTE — Assessment & Plan Note (Signed)
Continue to monitor

## 2023-01-05 NOTE — Assessment & Plan Note (Signed)
He notes significant improvement with miralax - ok to continue this.

## 2023-01-05 NOTE — Assessment & Plan Note (Signed)
Chronic, stable. Continue current regimen. 

## 2023-01-05 NOTE — Assessment & Plan Note (Signed)
Newly noted lymphocytes 57%, absolute lymphs 4.6 (H), with mild anemia.  Discussed possible causes including blood disorder MM, leukemia, dysplasia.  Repeat labs today with periph smear, SPEP, ESR and flow cytometry.  Discussed if persistent abnormality, will refer to hematology. Pt agrees with plan.

## 2023-01-05 NOTE — Patient Instructions (Addendum)
Ok to take miralax as needed for constipation. I've sent this in to the pharmacy for you.  Check with pharmacy about need for 2nd shingles shot.  Start vitamin D3 1000 units daily - over the counter.  One blood test (lymphocytes) returned high - repeat labs today. Pending results we may refer you to the blood doctor.  Return as needed or in 6 months for diabetes follow up visit.

## 2023-01-05 NOTE — Assessment & Plan Note (Addendum)
Reviewed with patient, encouraged good hydration status. Check SPEP.

## 2023-01-05 NOTE — Assessment & Plan Note (Signed)
Doing well on flomax daily.

## 2023-01-05 NOTE — Assessment & Plan Note (Signed)
Start vit D3 1000 IU daily.  ?

## 2023-01-05 NOTE — Assessment & Plan Note (Signed)
Chronic, great control on current regimen - continue. No low sugars.

## 2023-01-05 NOTE — Assessment & Plan Note (Signed)
Continue aspirin, statin.  

## 2023-01-06 LAB — CBC WITH DIFFERENTIAL/PLATELET
Basophils Relative: 0.6 %
MCHC: 34 g/dL (ref 32.0–36.0)
Monocytes Relative: 8.5 %
Platelets: 190 10*3/uL (ref 140–400)
RDW: 13 % (ref 11.0–15.0)

## 2023-01-08 LAB — PROTEIN ELECTROPHORESIS, SERUM, WITH REFLEX
Alpha 1: 0.2 g/dL (ref 0.2–0.3)
Alpha 2: 0.8 g/dL (ref 0.5–0.9)
Beta 2: 0.4 g/dL (ref 0.2–0.5)

## 2023-01-08 LAB — LEUKEMIA/LYMPHOMA EVALUATION PANEL

## 2023-01-08 LAB — CBC WITH DIFFERENTIAL/PLATELET
MCH: 31.4 pg (ref 27.0–33.0)
Neutrophils Relative %: 35.3 %

## 2023-01-12 ENCOUNTER — Other Ambulatory Visit: Payer: Self-pay | Admitting: Family Medicine

## 2023-01-12 DIAGNOSIS — D72820 Lymphocytosis (symptomatic): Secondary | ICD-10-CM

## 2023-01-12 LAB — CBC WITH DIFFERENTIAL/PLATELET
Absolute Monocytes: 553 cells/uL (ref 200–950)
Eosinophils Absolute: 91 cells/uL (ref 15–500)
Eosinophils Relative: 1.4 %
HCT: 37.1 % — ABNORMAL LOW (ref 38.5–50.0)
Hemoglobin: 12.6 g/dL — ABNORMAL LOW (ref 13.2–17.1)
MCV: 92.5 fL (ref 80.0–100.0)
MPV: 10.3 fL (ref 7.5–12.5)
Neutro Abs: 2295 cells/uL (ref 1500–7800)
RBC: 4.01 10*6/uL — ABNORMAL LOW (ref 4.20–5.80)
WBC: 6.5 10*3/uL (ref 3.8–10.8)

## 2023-01-12 LAB — IFE INTERPRETATION

## 2023-01-12 LAB — LEUKEMIA/LYMPHOMA EVALUATION PANEL
NUMBER OF MARKERS:: 22
VIABILITY:: 97 %

## 2023-01-12 LAB — PROTEIN ELECTROPHORESIS, SERUM, WITH REFLEX
Albumin ELP: 4 g/dL (ref 3.8–4.8)
Beta Globulin: 0.5 g/dL (ref 0.4–0.6)
Gamma Globulin: 0.6 g/dL — ABNORMAL LOW (ref 0.8–1.7)
Total Protein: 6.4 g/dL (ref 6.1–8.1)

## 2023-01-12 LAB — PATHOLOGIST SMEAR REVIEW

## 2023-01-14 ENCOUNTER — Inpatient Hospital Stay: Payer: Medicare HMO | Attending: Internal Medicine | Admitting: Internal Medicine

## 2023-01-14 ENCOUNTER — Inpatient Hospital Stay: Payer: Medicare HMO

## 2023-01-14 VITALS — BP 139/59 | HR 78 | Temp 96.7°F | Wt 160.0 lb

## 2023-01-14 DIAGNOSIS — D72820 Lymphocytosis (symptomatic): Secondary | ICD-10-CM

## 2023-01-14 DIAGNOSIS — D649 Anemia, unspecified: Secondary | ICD-10-CM

## 2023-01-14 DIAGNOSIS — D7289 Other specified disorders of white blood cells: Secondary | ICD-10-CM | POA: Diagnosis not present

## 2023-01-14 DIAGNOSIS — Z7982 Long term (current) use of aspirin: Secondary | ICD-10-CM | POA: Diagnosis not present

## 2023-01-14 DIAGNOSIS — N400 Enlarged prostate without lower urinary tract symptoms: Secondary | ICD-10-CM | POA: Insufficient documentation

## 2023-01-14 DIAGNOSIS — Z79899 Other long term (current) drug therapy: Secondary | ICD-10-CM | POA: Insufficient documentation

## 2023-01-14 DIAGNOSIS — Z87891 Personal history of nicotine dependence: Secondary | ICD-10-CM | POA: Diagnosis not present

## 2023-01-14 DIAGNOSIS — I10 Essential (primary) hypertension: Secondary | ICD-10-CM | POA: Insufficient documentation

## 2023-01-14 DIAGNOSIS — E785 Hyperlipidemia, unspecified: Secondary | ICD-10-CM | POA: Diagnosis not present

## 2023-01-14 DIAGNOSIS — Z85828 Personal history of other malignant neoplasm of skin: Secondary | ICD-10-CM | POA: Insufficient documentation

## 2023-01-14 DIAGNOSIS — Z7984 Long term (current) use of oral hypoglycemic drugs: Secondary | ICD-10-CM | POA: Insufficient documentation

## 2023-01-14 DIAGNOSIS — E119 Type 2 diabetes mellitus without complications: Secondary | ICD-10-CM | POA: Insufficient documentation

## 2023-01-14 LAB — CBC WITH DIFFERENTIAL/PLATELET
Abs Immature Granulocytes: 0.03 10*3/uL (ref 0.00–0.07)
Basophils Absolute: 0 10*3/uL (ref 0.0–0.1)
Basophils Relative: 1 %
Eosinophils Absolute: 0.1 10*3/uL (ref 0.0–0.5)
Eosinophils Relative: 1 %
HCT: 38.1 % — ABNORMAL LOW (ref 39.0–52.0)
Hemoglobin: 12.7 g/dL — ABNORMAL LOW (ref 13.0–17.0)
Immature Granulocytes: 0 %
Lymphocytes Relative: 40 %
Lymphs Abs: 3 10*3/uL (ref 0.7–4.0)
MCH: 31.1 pg (ref 26.0–34.0)
MCHC: 33.3 g/dL (ref 30.0–36.0)
MCV: 93.2 fL (ref 80.0–100.0)
Monocytes Absolute: 0.6 10*3/uL (ref 0.1–1.0)
Monocytes Relative: 8 %
Neutro Abs: 3.8 10*3/uL (ref 1.7–7.7)
Neutrophils Relative %: 50 %
Platelets: 190 10*3/uL (ref 150–400)
RBC: 4.09 MIL/uL — ABNORMAL LOW (ref 4.22–5.81)
RDW: 13.7 % (ref 11.5–15.5)
WBC: 7.5 10*3/uL (ref 4.0–10.5)
nRBC: 0 % (ref 0.0–0.2)

## 2023-01-14 LAB — IRON AND TIBC
Iron: 99 ug/dL (ref 45–182)
Saturation Ratios: 26 % (ref 17.9–39.5)
TIBC: 384 ug/dL (ref 250–450)
UIBC: 285 ug/dL

## 2023-01-14 LAB — FOLATE: Folate: 21.2 ng/mL (ref >5.9)

## 2023-01-14 LAB — VITAMIN B12: Vitamin B-12: 105 pg/mL — ABNORMAL LOW (ref 180–914)

## 2023-01-14 LAB — LACTATE DEHYDROGENASE: LDH: 123 U/L (ref 98–192)

## 2023-01-14 LAB — FERRITIN: Ferritin: 107 ng/mL (ref 24–336)

## 2023-01-14 NOTE — Progress Notes (Signed)
[facility]  Telephone:(336) [phone] Fax:(336) [phone]  ID: INFINITE NAYLOR OB: May 08, 1933  MR#: [id number]  QIO#:[id number]  Patient Care Team: Eustaquio Boyden, MD as PCP - General (Family Medicine)  REFERRING PROVIDER: Dr. Sharen Hones  REASON FOR REFERRAL: Lymphocytosis  HPI: John Sanford is a 89 y.o. male with past medical history of BPH, skin cancer, diabetes, hypertension, hyperlipidemia was referred to hematology for workup of lymphocytosis.  Routine blood work with primary care doctor from 12/24/2022 showed WBC 8.1, neutrophil 32%, lymphocyte 57% with ALC of 4.6.  Hemoglobin 12.3 and platelets 214.  Repeat CBC with differential from 01/05/2023.  WBC 6.5, lymphocyte 54% with ALC of 3.5.  Pathologist smear review showed myeloid population consisting predominantly of mature segmented neutrophils, few atypical lymphocytes.  Flow cytometry showed polyclonal B cells, heterogeneous CD3 expression in T cells and increased NK cells.  Molecular study for T-cell receptor gene rearrangement was recommended to rule out any clonal process.  SPEP showed hypogammaglobulinemia. No M spike.  Right hip surgery in Dec 2 years ago  Left hand goes to sleep  Denies weight loss. Denies appetite changes. Denies fever, chills, night sweats. Feels tired. No chest pain. Cramps in leg.  No recent infections No recent meds changes.    REVIEW OF SYSTEMS:   ROS  As per HPI. Otherwise, a complete review of systems is negative.  PAST MEDICAL HISTORY: Past Medical History:  Diagnosis Date   Allergy    Arthritis    BPH (benign prostatic hypertrophy)    Cancer (HCC)    hx of skin cancer    Diabetes mellitus    type 2    Hyperlipidemia    Hypertension    Pneumonia    hx of     PAST SURGICAL HISTORY: Past Surgical History:  Procedure Laterality Date   CATARACT EXTRACTION W/ INTRAOCULAR LENS IMPLANT     COLONOSCOPY     PROSTATE BIOPSY     negative    TONSILLECTOMY AND ADENOIDECTOMY     TOTAL HIP ARTHROPLASTY Left 07/29/2020   Procedure: LEFT TOTAL HIP ARTHROPLASTY ANTERIOR APPROACH;  Surgeon: Marcene Corning, MD;  Location: [facility];  Service: Orthopedics;  Laterality: Left;   VENTRAL HERNIA REPAIR     periumbilical    FAMILY HISTORY: Family History  Problem Relation Age of Onset   Heart disease Mother    Diabetes Father    Heart disease Father    Diabetes Brother    Heart disease Brother    Pancreatic cancer Brother     HEALTH MAINTENANCE: Social History   Tobacco Use   Smoking status: Former    Packs/day: 2.00    Years: 20.00    Additional pack years: 0.00    Total pack years: 40.00    Types: Cigarettes    Quit date: 08/16/1972    Years since quitting: 50.4   Smokeless tobacco: Never  Vaping Use   Vaping Use: Never used  Substance Use Topics   Alcohol use: No    Comment: rare   Drug use: No     No Known Allergies  Current Outpatient Medications  Medication Sig Dispense Refill   amLODipine (NORVASC) 5 MG tablet Take 1 tablet (5 mg total) by mouth daily. 90 tablet 4   aspirin EC 81 MG tablet Take 1 tablet (81 mg total) by mouth daily. Swallow whole. 30 tablet 12   Cholecalciferol (VITAMIN D3) 25 MCG (1000 UT) CAPS Take 1 capsule (1,000 Units total) by mouth daily. 90  capsule 4   docusate sodium (COLACE) 100 MG capsule Take 100 mg by mouth daily. OTC     glipiZIDE (GLUCOTROL XL) 5 MG 24 hr tablet Take 1 tablet (5 mg total) by mouth daily with breakfast. 90 tablet 4   Lancets (ONETOUCH DELICA PLUS LANCET33G) MISC Use as instructed to check blood sugar once daily. 100 each 3   losartan (COZAAR) 50 MG tablet Take 1 tablet (50 mg total) by mouth daily. 90 tablet 4   metFORMIN (GLUCOPHAGE) 500 MG tablet Take 1 tablet (500 mg total) by mouth 2 (two) times daily with a meal. 180 tablet 4   polyethylene glycol powder (GLYCOLAX/MIRALAX) 17 GM/SCOOP powder Take 8.5-17 g by mouth daily as needed for moderate constipation.  (1/2-1 capful) 3350 g 1   pravastatin (PRAVACHOL) 80 MG tablet Take 1 tablet (80 mg total) by mouth daily. 90 tablet 4   tamsulosin (FLOMAX) 0.4 MG CAPS capsule Take 1 capsule (0.4 mg total) by mouth daily. 90 capsule 4   No current facility-administered medications for this visit.    OBJECTIVE: There were no vitals filed for this visit.   There is no height or weight on file to calculate BMI.      General: Well-developed, well-nourished, no acute distress. Eyes: Pink conjunctiva, anicteric sclera. HEENT: Normocephalic, moist mucous membranes, clear oropharnyx. Lungs: Clear to auscultation bilaterally. Heart: Regular rate and rhythm. No rubs, murmurs, or gallops. Abdomen: Soft, nontender, nondistended. No organomegaly noted, normoactive bowel sounds. Musculoskeletal: No edema, cyanosis, or clubbing. Neuro: Alert, answering all questions appropriately. Cranial nerves grossly intact. Skin: No rashes or petechiae noted. Psych: Normal affect. Lymphatics: No cervical, calvicular, axillary or inguinal LAD.   LAB RESULTS:  Lab Results  Component Value Date   NA 137 12/24/2022   K 4.7 12/24/2022   CL 105 12/24/2022   CO2 25 12/24/2022   GLUCOSE 146 (H) 12/24/2022   BUN 25 (H) 12/24/2022   CREATININE 1.40 12/24/2022   CALCIUM 8.8 12/24/2022   PROT 6.4 01/05/2023   ALBUMIN 3.9 12/24/2022   AST 22 12/24/2022   ALT 18 12/24/2022   ALKPHOS 56 12/24/2022   BILITOT 0.5 12/24/2022   GFRNONAA 48 (L) 07/30/2020   GFRAA >60 04/03/2020    Lab Results  Component Value Date   WBC 6.5 01/05/2023   NEUTROABS 2,295 01/05/2023   HGB 12.6 (L) 01/05/2023   HCT 37.1 (L) 01/05/2023   MCV 92.5 01/05/2023   PLT 190 01/05/2023    No results found for: "TIBC", "FERRITIN", "IRONPCTSAT"   STUDIES: No results found.  ASSESSMENT AND PLAN:   John Sanford is a 89 y.o. male with pmh of BPH, skin cancer, diabetes, hypertension, hyperlipidemia was referred to hematology for workup of  lymphocytosis.  # Lymphocytosis # Elevated NK cells count on flow cytometry - Routine blood work with primary care doctor from 12/24/2022 showed WBC 8.1, neutrophil 32%, lymphocyte 57% with ALC of 4.6.  Hemoglobin 12.3 and platelets 214. Repeat CBC with differential from 01/05/2023.  WBC 6.5, lymphocyte 54% with ALC of 3.5.  - Pathologist smear review showed myeloid population consisting predominantly of mature segmented neutrophils, few atypical lymphocytes.  - Flow cytometry showed polyclonal B cells, heterogeneous CD3 expression in T cells and increased NK cells.  Molecular study for T-cell receptor gene rearrangement was recommended to rule out any clonal process. SPEP showed hypogammaglobulinemia. No M spike.  -Despite patient has elevated lymphocyte percentage, on repeat testing ALC was within normal limit.  Flow cytometry showed heterogeneous  CD3 expression in T cells and increased NK cells which is nonspecific can be seen with recent infection, medications, immune conditions or neoplastic process.  I will send for T-cell receptor gene rearrangement to rule out any clonal process.  I will also check for iron panel, ferritin, B12 and folate as a part of anemia workup.  Repeat CBC with differential today.  Will also check for ANA to rule out any autoimmune process.  Orders Placed This Encounter  Procedures   CBC with Differential/Platelet   Iron and TIBC   Vitamin B12   Ferritin   Folate   ANA w/Reflex    RTC in 2 weeks for MD visit to discuss labs. Lab today  Patient expressed understanding and was in agreement with this plan. He also understands that He can call clinic at any time with any questions, concerns, or complaints.   I spent a total of 45 minutes reviewing chart data, face-to-face evaluation with the patient, counseling and coordination of care as detailed above.  Michaelyn Barter, MD   01/14/2023 10:53 AM

## 2023-01-17 LAB — ANA W/REFLEX: Anti Nuclear Antibody (ANA): NEGATIVE

## 2023-01-26 LAB — MISC LABCORP TEST (SEND OUT): Labcorp test code: 48108

## 2023-01-28 ENCOUNTER — Inpatient Hospital Stay: Payer: Medicare HMO | Attending: Internal Medicine | Admitting: Internal Medicine

## 2023-01-28 ENCOUNTER — Encounter: Payer: Self-pay | Admitting: Internal Medicine

## 2023-01-28 VITALS — BP 123/67 | HR 78 | Temp 97.8°F | Wt 160.7 lb

## 2023-01-28 DIAGNOSIS — D7289 Other specified disorders of white blood cells: Secondary | ICD-10-CM | POA: Diagnosis not present

## 2023-01-28 DIAGNOSIS — D72820 Lymphocytosis (symptomatic): Secondary | ICD-10-CM | POA: Insufficient documentation

## 2023-01-28 DIAGNOSIS — Z800 Family history of malignant neoplasm of digestive organs: Secondary | ICD-10-CM | POA: Insufficient documentation

## 2023-01-28 DIAGNOSIS — D649 Anemia, unspecified: Secondary | ICD-10-CM | POA: Diagnosis not present

## 2023-01-28 DIAGNOSIS — E538 Deficiency of other specified B group vitamins: Secondary | ICD-10-CM | POA: Diagnosis not present

## 2023-01-28 NOTE — Progress Notes (Signed)
[facility]  Telephone:(336) [phone] Fax:(336) [phone]  ID: John Sanford OB: 1932/12/03  MR#: [id number]  AOZ#:[id number]  Patient Care Team: Eustaquio Boyden, MD as PCP - General (Family Medicine)  HPI: John Sanford is a 89 y.o. male with past medical history of BPH, skin cancer, diabetes, hypertension, hyperlipidemia was referred to hematology for workup of lymphocytosis.  Routine blood work with primary care doctor from 12/24/2022 showed WBC 8.1, neutrophil 32%, lymphocyte 57% with ALC of 4.6.  Hemoglobin 12.3 and platelets 214.  Repeat CBC with differential from 01/05/2023.  WBC 6.5, lymphocyte 54% with ALC of 3.5.  Pathologist smear review showed myeloid population consisting predominantly of mature segmented neutrophils, few atypical lymphocytes.  Flow cytometry showed polyclonal B cells, heterogeneous CD3 expression in T cells and increased NK cells.  Molecular study for T-cell receptor gene rearrangement was recommended to rule out any clonal process.  SPEP showed hypogammaglobulinemia. No M spike.  Right hip surgery in Dec 2 years ago  Left hand goes to sleep  Denies weight loss. Denies appetite changes. Denies fever, chills, night sweats. Feels tired. No chest pain. Cramps in leg.  No recent infections No recent meds changes.   Interval history Patient was seen today accompanied by son to discuss labs. He is feeling well overall.  Denies any new changes or complaints since the last clinic visit.   REVIEW OF SYSTEMS:   ROS  As per HPI. Otherwise, a complete review of systems is negative.  PAST MEDICAL HISTORY: Past Medical History:  Diagnosis Date   Allergy    Arthritis    BPH (benign prostatic hypertrophy)    Cancer (HCC)    hx of skin cancer    Diabetes mellitus    type 2    Hyperlipidemia    Hypertension    Pneumonia    hx of     PAST SURGICAL HISTORY: Past Surgical History:  Procedure Laterality Date   CATARACT  EXTRACTION W/ INTRAOCULAR LENS IMPLANT     COLONOSCOPY     PROSTATE BIOPSY     negative   TONSILLECTOMY AND ADENOIDECTOMY     TOTAL HIP ARTHROPLASTY Left 07/29/2020   Procedure: LEFT TOTAL HIP ARTHROPLASTY ANTERIOR APPROACH;  Surgeon: Marcene Corning, MD;  Location: [facility];  Service: Orthopedics;  Laterality: Left;   VENTRAL HERNIA REPAIR     periumbilical    FAMILY HISTORY: Family History  Problem Relation Age of Onset   Heart disease Mother    Diabetes Father    Heart disease Father    Diabetes Brother    Heart disease Brother    Pancreatic cancer Brother     HEALTH MAINTENANCE: Social History   Tobacco Use   Smoking status: Former    Packs/day: 2.00    Years: 20.00    Additional pack years: 0.00    Total pack years: 40.00    Types: Cigarettes    Quit date: 08/16/1972    Years since quitting: 50.4   Smokeless tobacco: Never  Vaping Use   Vaping Use: Never used  Substance Use Topics   Alcohol use: No    Comment: rare   Drug use: No     No Known Allergies  Current Outpatient Medications  Medication Sig Dispense Refill   amLODipine (NORVASC) 5 MG tablet Take 1 tablet (5 mg total) by mouth daily. 90 tablet 4   aspirin EC 81 MG tablet Take 1 tablet (81 mg total) by mouth daily. Swallow whole. 30 tablet 12  Cholecalciferol (VITAMIN D3) 25 MCG (1000 UT) CAPS Take 1 capsule (1,000 Units total) by mouth daily. 90 capsule 4   docusate sodium (COLACE) 100 MG capsule Take 100 mg by mouth daily. OTC     glipiZIDE (GLUCOTROL XL) 5 MG 24 hr tablet Take 1 tablet (5 mg total) by mouth daily with breakfast. 90 tablet 4   Lancets (ONETOUCH DELICA PLUS LANCET33G) MISC Use as instructed to check blood sugar once daily. 100 each 3   losartan (COZAAR) 50 MG tablet Take 1 tablet (50 mg total) by mouth daily. 90 tablet 4   metFORMIN (GLUCOPHAGE) 500 MG tablet Take 1 tablet (500 mg total) by mouth 2 (two) times daily with a meal. 180 tablet 4   polyethylene glycol powder  (GLYCOLAX/MIRALAX) 17 GM/SCOOP powder Take 8.5-17 g by mouth daily as needed for moderate constipation. (1/2-1 capful) 3350 g 1   pravastatin (PRAVACHOL) 80 MG tablet Take 1 tablet (80 mg total) by mouth daily. 90 tablet 4   tamsulosin (FLOMAX) 0.4 MG CAPS capsule Take 1 capsule (0.4 mg total) by mouth daily. 90 capsule 4   No current facility-administered medications for this visit.    OBJECTIVE: Vitals:   01/28/23 1051  BP: 123/67  Pulse: 78  Temp: 97.8 F (36.6 C)  SpO2: 98%     Body mass index is 25.74 kg/m.      General: Well-developed, well-nourished, no acute distress. Eyes: Pink conjunctiva, anicteric sclera. HEENT: Normocephalic, moist mucous membranes, clear oropharnyx. Lungs: Clear to auscultation bilaterally. Heart: Regular rate and rhythm. No rubs, murmurs, or gallops. Abdomen: Soft, nontender, nondistended. No organomegaly noted, normoactive bowel sounds. Musculoskeletal: No edema, cyanosis, or clubbing. Neuro: Alert, answering all questions appropriately. Cranial nerves grossly intact. Skin: No rashes or petechiae noted. Psych: Normal affect. Lymphatics: No cervical, calvicular, axillary or inguinal LAD.   LAB RESULTS:  Lab Results  Component Value Date   NA 137 12/24/2022   K 4.7 12/24/2022   CL 105 12/24/2022   CO2 25 12/24/2022   GLUCOSE 146 (H) 12/24/2022   BUN 25 (H) 12/24/2022   CREATININE 1.40 12/24/2022   CALCIUM 8.8 12/24/2022   PROT 6.4 01/05/2023   ALBUMIN 3.9 12/24/2022   AST 22 12/24/2022   ALT 18 12/24/2022   ALKPHOS 56 12/24/2022   BILITOT 0.5 12/24/2022   GFRNONAA 48 (L) 07/30/2020   GFRAA >60 04/03/2020    Lab Results  Component Value Date   WBC 7.5 01/14/2023   NEUTROABS 3.8 01/14/2023   HGB 12.7 (L) 01/14/2023   HCT 38.1 (L) 01/14/2023   MCV 93.2 01/14/2023   PLT 190 01/14/2023    Lab Results  Component Value Date   TIBC 384 01/14/2023   FERRITIN 107 01/14/2023   IRONPCTSAT 26 01/14/2023     STUDIES: No results  found.  ASSESSMENT AND PLAN:   John Sanford is a 89 y.o. male with pmh of BPH, skin cancer, diabetes, hypertension, hyperlipidemia was referred to hematology for workup of lymphocytosis.  # Lymphocytosis # Elevated NK cells count on flow cytometry - Routine blood work with primary care doctor from 12/24/2022 showed WBC 8.1, neutrophil 32%, lymphocyte 57% with ALC of 4.6.  Hemoglobin 12.3 and platelets 214. Repeat CBC with differential from 01/05/2023.  WBC 6.5, lymphocyte 54% with ALC of 3.5.  - Pathologist smear review showed myeloid population consisting predominantly of mature segmented neutrophils, few atypical lymphocytes.  - Flow cytometry showed polyclonal B cells, heterogeneous CD3 expression in T cells and increased NK  cells.  Molecular study for T-cell receptor gene rearrangement was recommended to rule out any clonal process. SPEP showed hypogammaglobulinemia. No M spike.  -Repeat CBC with differential (01/14/2023) showed normalization of lymphocyte count with ALC of 3 and 40%.  Mild anemia hemoglobin 12.7.  Iron panel normal.  Folate normal.  B12 105.  ANA negative.  LDH normal.  T-cell receptor gene rearrangement is positive.  His lymphocyte count has normalized on repeat blood work. T-cell receptor can be positive in nonmalignant conditions (reactive) such as recent viral infection or an elderly there could be a small clone of no clinical significance versus malignancy.  Reports feeling well.  Denies any B symptoms.  I discussed with the patient and his son that considering his lymphocytes have normalized and he is asymptomatic, I would consider holding off on bone marrow biopsy.  We can repeat blood work in 3 months time to assess for any changes.  He was advised to reach out to our clinic if he experiences any symptoms outside of his baseline.  They were agreeable with the plan.  # Vitamin B12 deficiency -B12 level 105.  Advised to start vitamin B12 supplements 1000 mcg  daily. -Recheck level in 3 months.  If still low, will consider B12 injections.    Orders Placed This Encounter  Procedures   CBC with Differential/Platelet   Comprehensive metabolic panel   Vitamin B12   RTC in 3 months for MD visit, labs  Patient expressed understanding and was in agreement with this plan. He also understands that He can call clinic at any time with any questions, concerns, or complaints.   I spent a total of 30 minutes reviewing chart data, face-to-face evaluation with the patient, counseling and coordination of care as detailed above.  Michaelyn Barter, MD   01/28/2023 12:38 PM

## 2023-01-28 NOTE — Patient Instructions (Signed)
Please start vitamin b12 1000 mcg once daily.

## 2023-04-29 ENCOUNTER — Inpatient Hospital Stay (HOSPITAL_BASED_OUTPATIENT_CLINIC_OR_DEPARTMENT_OTHER): Payer: Medicare HMO | Admitting: Internal Medicine

## 2023-04-29 ENCOUNTER — Encounter: Payer: Self-pay | Admitting: Internal Medicine

## 2023-04-29 ENCOUNTER — Inpatient Hospital Stay: Payer: Medicare HMO | Attending: Internal Medicine

## 2023-04-29 VITALS — BP 150/58 | HR 73 | Temp 98.2°F | Ht 66.25 in | Wt 160.0 lb

## 2023-04-29 DIAGNOSIS — E119 Type 2 diabetes mellitus without complications: Secondary | ICD-10-CM | POA: Diagnosis not present

## 2023-04-29 DIAGNOSIS — D72820 Lymphocytosis (symptomatic): Secondary | ICD-10-CM | POA: Diagnosis not present

## 2023-04-29 DIAGNOSIS — E538 Deficiency of other specified B group vitamins: Secondary | ICD-10-CM

## 2023-04-29 DIAGNOSIS — Z7982 Long term (current) use of aspirin: Secondary | ICD-10-CM | POA: Insufficient documentation

## 2023-04-29 DIAGNOSIS — Z79899 Other long term (current) drug therapy: Secondary | ICD-10-CM | POA: Diagnosis not present

## 2023-04-29 DIAGNOSIS — E785 Hyperlipidemia, unspecified: Secondary | ICD-10-CM | POA: Insufficient documentation

## 2023-04-29 DIAGNOSIS — Z85828 Personal history of other malignant neoplasm of skin: Secondary | ICD-10-CM | POA: Insufficient documentation

## 2023-04-29 DIAGNOSIS — I10 Essential (primary) hypertension: Secondary | ICD-10-CM | POA: Diagnosis not present

## 2023-04-29 DIAGNOSIS — D7289 Other specified disorders of white blood cells: Secondary | ICD-10-CM

## 2023-04-29 DIAGNOSIS — Z87891 Personal history of nicotine dependence: Secondary | ICD-10-CM | POA: Insufficient documentation

## 2023-04-29 DIAGNOSIS — Z7984 Long term (current) use of oral hypoglycemic drugs: Secondary | ICD-10-CM | POA: Insufficient documentation

## 2023-04-29 DIAGNOSIS — N400 Enlarged prostate without lower urinary tract symptoms: Secondary | ICD-10-CM | POA: Diagnosis not present

## 2023-04-29 LAB — CBC WITH DIFFERENTIAL/PLATELET
Abs Immature Granulocytes: 0.03 10*3/uL (ref 0.00–0.07)
Basophils Absolute: 0 10*3/uL (ref 0.0–0.1)
Basophils Relative: 1 %
Eosinophils Absolute: 0.2 10*3/uL (ref 0.0–0.5)
Eosinophils Relative: 2 %
HCT: 39.6 % (ref 39.0–52.0)
Hemoglobin: 13.1 g/dL (ref 13.0–17.0)
Immature Granulocytes: 0 %
Lymphocytes Relative: 33 %
Lymphs Abs: 2.5 10*3/uL (ref 0.7–4.0)
MCH: 31.2 pg (ref 26.0–34.0)
MCHC: 33.1 g/dL (ref 30.0–36.0)
MCV: 94.3 fL (ref 80.0–100.0)
Monocytes Absolute: 0.6 10*3/uL (ref 0.1–1.0)
Monocytes Relative: 7 %
Neutro Abs: 4.3 10*3/uL (ref 1.7–7.7)
Neutrophils Relative %: 57 %
Platelets: 214 10*3/uL (ref 150–400)
RBC: 4.2 MIL/uL — ABNORMAL LOW (ref 4.22–5.81)
RDW: 13.6 % (ref 11.5–15.5)
WBC: 7.6 10*3/uL (ref 4.0–10.5)
nRBC: 0 % (ref 0.0–0.2)

## 2023-04-29 LAB — COMPREHENSIVE METABOLIC PANEL
ALT: 18 U/L (ref 0–44)
AST: 20 U/L (ref 15–41)
Albumin: 4.1 g/dL (ref 3.5–5.0)
Alkaline Phosphatase: 56 U/L (ref 38–126)
Anion gap: 8 (ref 5–15)
BUN: 33 mg/dL — ABNORMAL HIGH (ref 8–23)
CO2: 24 mmol/L (ref 22–32)
Calcium: 9.3 mg/dL (ref 8.9–10.3)
Chloride: 102 mmol/L (ref 98–111)
Creatinine, Ser: 1.27 mg/dL — ABNORMAL HIGH (ref 0.61–1.24)
GFR, Estimated: 55 mL/min — ABNORMAL LOW (ref >60)
Glucose, Bld: 150 mg/dL — ABNORMAL HIGH (ref 70–99)
Potassium: 4.3 mmol/L (ref 3.5–5.1)
Sodium: 134 mmol/L — ABNORMAL LOW (ref 135–145)
Total Bilirubin: 0.6 mg/dL (ref 0.3–1.2)
Total Protein: 7.4 g/dL (ref 6.5–8.1)

## 2023-04-29 LAB — VITAMIN B12: Vitamin B-12: 585 pg/mL (ref 180–914)

## 2023-04-29 NOTE — Progress Notes (Signed)
[facility]  [phone] [phone]  ID: MONTERO MONCE OB: [birth year]  MR#: [id number]  YTK#:[id number]  Patient Care Team: Eustaquio Boyden, MD as PCP - General (Family Medicine)  HPI: John Sanford is a 89 y.o. male with past medical history of BPH, skin cancer, diabetes, hypertension, hyperlipidemia was referred to hematology for workup of lymphocytosis.  Routine blood work with primary care doctor from 12/24/2022 showed WBC 8.1, neutrophil 32%, lymphocyte 57% with ALC of 4.6.  Hemoglobin 12.3 and platelets 214.  Repeat CBC with differential from 01/05/2023.  WBC 6.5, lymphocyte 54% with ALC of 3.5.  Pathologist smear review showed myeloid population consisting predominantly of mature segmented neutrophils, few atypical lymphocytes.  Flow cytometry showed polyclonal B cells, heterogeneous CD3 expression in T cells and increased NK cells.  Molecular study for T-cell receptor gene rearrangement was recommended to rule out any clonal process.  T-cell receptor gene rearrangement positive. T-cell receptor can be positive in nonmalignant conditions (reactive) such as recent viral infection or an elderly there could be a small clone of no clinical significance versus malignancy.     Interval history Patient was seen today accompanied with daughter to discuss labs. He is feeling well overall.  Denies any new changes or complaints since the last clinic visit. Trying to gain weight.  REVIEW OF SYSTEMS:   ROS  As per HPI. Otherwise, a complete review of systems is negative.  PAST MEDICAL HISTORY: Past Medical History:  Diagnosis Date   Allergy    Arthritis    BPH (benign prostatic hypertrophy)    Cancer (HCC)    hx of skin cancer    Diabetes mellitus    type 2    Hyperlipidemia    Hypertension    Pneumonia    hx of     PAST SURGICAL HISTORY: Past Surgical History:  Procedure Laterality Date   CATARACT EXTRACTION W/  INTRAOCULAR LENS IMPLANT     COLONOSCOPY     PROSTATE BIOPSY     negative   TONSILLECTOMY AND ADENOIDECTOMY     TOTAL HIP ARTHROPLASTY Left 07/29/2020   Procedure: LEFT TOTAL HIP ARTHROPLASTY ANTERIOR APPROACH;  Surgeon: Marcene Corning, MD;  Location: [facility];  Service: Orthopedics;  Laterality: Left;   VENTRAL HERNIA REPAIR     periumbilical    FAMILY HISTORY: Family History  Problem Relation Age of Onset   Heart disease Mother    Diabetes Father    Heart disease Father    Diabetes Brother    Heart disease Brother    Pancreatic cancer Brother     HEALTH MAINTENANCE: Social History   Tobacco Use   Smoking status: Former    Current packs/day: 0.00    Average packs/day: 2.0 packs/day for 20.0 years (40.0 ttl pk-yrs)    Types: Cigarettes    Start date: 08/16/1952    Quit date: 08/16/1972    Years since quitting: 50.7   Smokeless tobacco: Never  Vaping Use   Vaping status: Never Used  Substance Use Topics   Alcohol use: No    Comment: rare   Drug use: No     No Known Allergies  Current Outpatient Medications  Medication Sig Dispense Refill   amLODipine (NORVASC) 5 MG tablet Take 1 tablet (5 mg total) by mouth daily. 90 tablet 4   aspirin EC 81 MG tablet Take 1 tablet (81 mg total) by mouth daily. Swallow whole. 30 tablet 12   Cholecalciferol (VITAMIN D3) 25 MCG (1000 UT)  CAPS Take 1 capsule (1,000 Units total) by mouth daily. 90 capsule 4   docusate sodium (COLACE) 100 MG capsule Take 100 mg by mouth daily. OTC     glipiZIDE (GLUCOTROL XL) 5 MG 24 hr tablet Take 1 tablet (5 mg total) by mouth daily with breakfast. 90 tablet 4   Lancets (ONETOUCH DELICA PLUS LANCET33G) MISC Use as instructed to check blood sugar once daily. 100 each 3   losartan (COZAAR) 50 MG tablet Take 1 tablet (50 mg total) by mouth daily. 90 tablet 4   metFORMIN (GLUCOPHAGE) 500 MG tablet Take 1 tablet (500 mg total) by mouth 2 (two) times daily with a meal. 180 tablet 4   polyethylene glycol powder  (GLYCOLAX/MIRALAX) 17 GM/SCOOP powder Take 8.5-17 g by mouth daily as needed for moderate constipation. (1/2-1 capful) 3350 g 1   pravastatin (PRAVACHOL) 80 MG tablet Take 1 tablet (80 mg total) by mouth daily. 90 tablet 4   tamsulosin (FLOMAX) 0.4 MG CAPS capsule Take 1 capsule (0.4 mg total) by mouth daily. 90 capsule 4   No current facility-administered medications for this visit.    OBJECTIVE: Vitals:   04/29/23 1036  BP: (!) 150/58  Pulse: 73  Temp: 98.2 F (36.8 C)  SpO2: 100%     Body mass index is 25.63 kg/m.      General: Well-developed, well-nourished, no acute distress. Eyes: Pink conjunctiva, anicteric sclera. HEENT: Normocephalic, moist mucous membranes, clear oropharnyx. Lungs: Clear to auscultation bilaterally. Heart: Regular rate and rhythm. No rubs, murmurs, or gallops. Abdomen: Soft, nontender, nondistended. No organomegaly noted, normoactive bowel sounds. Musculoskeletal: No edema, cyanosis, or clubbing. Neuro: Alert, answering all questions appropriately. Cranial nerves grossly intact. Skin: No rashes or petechiae noted. Psych: Normal affect. Lymphatics: No cervical, calvicular, axillary or inguinal LAD.   LAB RESULTS:  Lab Results  Component Value Date   NA 134 (L) 04/29/2023   K 4.3 04/29/2023   CL 102 04/29/2023   CO2 24 04/29/2023   GLUCOSE 150 (H) 04/29/2023   BUN 33 (H) 04/29/2023   CREATININE 1.27 (H) 04/29/2023   CALCIUM 9.3 04/29/2023   PROT 7.4 04/29/2023   ALBUMIN 4.1 04/29/2023   AST 20 04/29/2023   ALT 18 04/29/2023   ALKPHOS 56 04/29/2023   BILITOT 0.6 04/29/2023   GFRNONAA 55 (L) 04/29/2023   GFRAA >60 04/03/2020    Lab Results  Component Value Date   WBC 7.6 04/29/2023   NEUTROABS 4.3 04/29/2023   HGB 13.1 04/29/2023   HCT 39.6 04/29/2023   MCV 94.3 04/29/2023   PLT 214 04/29/2023    Lab Results  Component Value Date   TIBC 384 01/14/2023   FERRITIN 107 01/14/2023   IRONPCTSAT 26 01/14/2023     STUDIES: No  results found.  ASSESSMENT AND PLAN:   John Sanford is a 89 y.o. male with pmh of BPH, skin cancer, diabetes, hypertension, hyperlipidemia was referred to hematology for workup of lymphocytosis.  # Lymphocytosis - Routine blood work with primary care doctor from 12/24/2022 showed WBC 8.1, neutrophil 32%, lymphocyte 57% with ALC of 4.6.  Hemoglobin 12.3 and platelets 214. Repeat CBC with differential from 01/05/2023.  WBC 6.5, lymphocyte 54% with ALC of 3.5.  - Pathologist smear review showed myeloid population consisting predominantly of mature segmented neutrophils, few atypical lymphocytes.  - Flow cytometry showed polyclonal B cells, heterogeneous CD3 expression in T cells and increased NK cells.  Molecular study for T-cell receptor gene rearrangement was recommended to rule out  any clonal process. SPEP showed hypogammaglobulinemia. No M spike.  - T-cell receptor gene rearrangement is positive.  His lymphocyte count has normalized on repeat blood work. T-cell receptor can be positive in nonmalignant conditions (reactive) such as recent viral infection or an elderly there could be a small clone of no clinical significance versus malignancy.   -CBC with differential has been repeated twice.  His lymphocytes have normalized.  Hemoglobin and platelets are normal.  Other workup including iron panel/folate/ANA screen normal.  LDH normal.  Patient is very active.  Otherwise feeling well.  He no longer needs follow-up with hematology.  He will continue to follow with Dr. Sharen Hones for lab check once or twice a year.  If there are any concerns, patient can be referred back to hematology.  # Vitamin B12 deficiency -B12 level 105.  Continue with B12 1000 mcg daily -Repeat B12 pending from today.  If still low, will need B12 injections.  No orders of the defined types were placed in this encounter.  RTC as needed  Patient expressed understanding and was in agreement with this plan. He also  understands that He can call clinic at any time with any questions, concerns, or complaints.   I spent a total of 30 minutes reviewing chart data, face-to-face evaluation with the patient, counseling and coordination of care as detailed above.  Michaelyn Barter, MD   04/29/2023 12:02 PM

## 2023-07-05 ENCOUNTER — Ambulatory Visit (INDEPENDENT_AMBULATORY_CARE_PROVIDER_SITE_OTHER): Payer: Medicare HMO | Admitting: Family Medicine

## 2023-07-05 ENCOUNTER — Encounter: Payer: Self-pay | Admitting: Family Medicine

## 2023-07-05 VITALS — BP 152/60 | HR 63 | Temp 97.8°F | Ht 66.75 in | Wt 163.0 lb

## 2023-07-05 DIAGNOSIS — I10 Essential (primary) hypertension: Secondary | ICD-10-CM

## 2023-07-05 DIAGNOSIS — E1149 Type 2 diabetes mellitus with other diabetic neurological complication: Secondary | ICD-10-CM | POA: Diagnosis not present

## 2023-07-05 DIAGNOSIS — Z23 Encounter for immunization: Secondary | ICD-10-CM

## 2023-07-05 DIAGNOSIS — E538 Deficiency of other specified B group vitamins: Secondary | ICD-10-CM

## 2023-07-05 DIAGNOSIS — H9190 Unspecified hearing loss, unspecified ear: Secondary | ICD-10-CM | POA: Insufficient documentation

## 2023-07-05 DIAGNOSIS — H9193 Unspecified hearing loss, bilateral: Secondary | ICD-10-CM

## 2023-07-05 DIAGNOSIS — Z7984 Long term (current) use of oral hypoglycemic drugs: Secondary | ICD-10-CM | POA: Diagnosis not present

## 2023-07-05 LAB — POCT GLYCOSYLATED HEMOGLOBIN (HGB A1C): Hemoglobin A1C: 5.7 % — AB (ref 4.0–5.6)

## 2023-07-05 MED ORDER — VITAMIN B-12 1000 MCG PO TABS: 1000.0000 ug | ORAL_TABLET | Freq: Every day | ORAL | Status: AC

## 2023-07-05 NOTE — Assessment & Plan Note (Addendum)
Chronic, stable on current regimen. Not regularly checking sugars.  Will request latest eye exam from early 2024.  Continue metformin and glipizide XL

## 2023-07-05 NOTE — Assessment & Plan Note (Addendum)
L>R. Update hearing screen today - failed.  Recommend audiology evaluation and likely hearing aides. He will investigate and let me know preferred audiologist.

## 2023-07-05 NOTE — Progress Notes (Signed)
Ph: [phone] Fax: [phone]   Patient ID: John Sanford, male    DOB: 03/16/1933, 89 y.o.   MRN: [id number]  This visit was conducted in person.  BP (!) 152/60 (BP Location: Right Arm, Cuff Size: Large)   Pulse 63   Temp 97.8 F (36.6 C) (Oral)   Ht 5' 6.75" (1.695 m)   Wt 163 lb (73.9 kg)   SpO2 98%   BMI 25.72 kg/m   BP Readings from Last 3 Encounters:  07/05/23 (!) 152/60  04/29/23 (!) 150/58  01/28/23 123/67   Hearing Screening   500Hz  1000Hz  2000Hz  4000Hz   Right ear 40 0 0 0  Left ear 40 0 0 0b     CC: 6 mo f/u visit Subjective:   HPI: John Sanford is a 89 y.o. male presenting on 07/05/2023 for Medical Management of Chronic Issues (Here for 6 mo DM f/u.)   Saw health advisor 12/2022 for medicare wellness visit. Note reviewed.  Will be due for CPE next visit.   Chronic trouble hearing L>R. Doesn't wear hearing aides. Has not seen audiologist, declines referral.   Last visit noted lymphocytosis - established with hematology Dr Alena Bills, flow cytometry showed polyclonal B cells, T-cell receptor gene rearrangement positive. SPEP with hypogammaglobulinemia. Pt overall feels well. Lymphocytes normalize din interim. Found B12 deficiency - repleted. Planned heme f/u PRN.   HTN - continues amlodipine 5mg  daily, losartan 50mg  daily.   He regularly eats 2 meals/day.   DM - does not regularly check sugars. Compliant with antihyperglycemic regimen which includes: glipizide XL 5mg  daily and metformin 500mg  BID. Was on trulicity 2022 but this caused significant anorexia. Denies low sugars or hypoglycemic symptoms. Denies paresthesias, blurry vision. Last diabetic eye exam done early 2024 at [facility] doctor - records requested. Glucometer brand: one touch delica plus - not using. Last foot exam: DUE. DSME: referred, he states he saw them ~2021 Lab Results  Component Value Date   HGBA1C 5.7 (A) 07/05/2023   Diabetic Foot Exam - Simple   Simple Foot  Form Diabetic Foot exam was performed with the following findings: Yes 07/05/2023 10:42 AM  Visual Inspection See comments: Yes Sensation Testing Intact to touch and monofilament testing bilaterally: Yes Pulse Check Posterior Tibialis and Dorsalis pulse intact bilaterally: Yes Comments No claudication R>L bunion with hallux varus    Lab Results  Component Value Date   MICROALBUR 1.6 12/24/2022        Relevant past medical, surgical, family and social history reviewed and updated as indicated. Interim medical history since our last visit reviewed. Allergies and medications reviewed and updated. Outpatient Medications Prior to Visit  Medication Sig Dispense Refill   amLODipine (NORVASC) 5 MG tablet Take 1 tablet (5 mg total) by mouth daily. 90 tablet 4   aspirin EC 81 MG tablet Take 1 tablet (81 mg total) by mouth daily. Swallow whole. 30 tablet 12   Cholecalciferol (VITAMIN D3) 25 MCG (1000 UT) CAPS Take 1 capsule (1,000 Units total) by mouth daily. 90 capsule 4   docusate sodium (COLACE) 100 MG capsule Take 100 mg by mouth daily. OTC     glipiZIDE (GLUCOTROL XL) 5 MG 24 hr tablet Take 1 tablet (5 mg total) by mouth daily with breakfast. 90 tablet 4   losartan (COZAAR) 50 MG tablet Take 1 tablet (50 mg total) by mouth daily. 90 tablet 4   metFORMIN (GLUCOPHAGE) 500 MG tablet Take 1 tablet (500 mg total) by mouth 2 (two) times  daily with a meal. 180 tablet 4   polyethylene glycol powder (GLYCOLAX/MIRALAX) 17 GM/SCOOP powder Take 8.5-17 g by mouth daily as needed for moderate constipation. (1/2-1 capful) 3350 g 1   pravastatin (PRAVACHOL) 80 MG tablet Take 1 tablet (80 mg total) by mouth daily. 90 tablet 4   tamsulosin (FLOMAX) 0.4 MG CAPS capsule Take 1 capsule (0.4 mg total) by mouth daily. 90 capsule 4   Lancets (ONETOUCH DELICA PLUS LANCET33G) MISC Use as instructed to check blood sugar once daily. 100 each 3   No facility-administered medications prior to visit.     Per HPI  unless specifically indicated in ROS section below Review of Systems  Objective:  BP (!) 152/60 (BP Location: Right Arm, Cuff Size: Large)   Pulse 63   Temp 97.8 F (36.6 C) (Oral)   Ht 5' 6.75" (1.695 m)   Wt 163 lb (73.9 kg)   SpO2 98%   BMI 25.72 kg/m   Wt Readings from Last 3 Encounters:  07/05/23 163 lb (73.9 kg)  04/29/23 160 lb (72.6 kg)  01/28/23 160 lb 11.2 oz (72.9 kg)      Physical Exam Vitals and nursing note reviewed.  Constitutional:      Appearance: Normal appearance. He is not ill-appearing.  HENT:     Head: Normocephalic and atraumatic.     Mouth/Throat:     Mouth: Mucous membranes are moist.     Pharynx: Oropharynx is clear. No oropharyngeal exudate or posterior oropharyngeal erythema.  Eyes:     Extraocular Movements: Extraocular movements intact.     Conjunctiva/sclera: Conjunctivae normal.     Pupils: Pupils are equal, round, and reactive to light.  Cardiovascular:     Rate and Rhythm: Normal rate and regular rhythm.     Pulses: Normal pulses.     Heart sounds: Normal heart sounds. No murmur heard. Pulmonary:     Effort: Pulmonary effort is normal. No respiratory distress.     Breath sounds: Normal breath sounds. No wheezing, rhonchi or rales.  Musculoskeletal:     Right lower leg: No edema.     Left lower leg: No edema.     Comments: See HPI for foot exam if done  Skin:    General: Skin is warm and dry.     Findings: No rash.  Neurological:     Mental Status: He is alert.  Psychiatric:        Mood and Affect: Mood normal.        Behavior: Behavior normal.       Results for orders placed or performed in visit on 07/05/23  POCT glycosylated hemoglobin (Hb A1C)  Result Value Ref Range   Hemoglobin A1C 5.7 (A) 4.0 - 5.6 %   HbA1c POC (<> result, manual entry)     HbA1c, POC (prediabetic range)     HbA1c, POC (controlled diabetic range)     Lab Results  Component Value Date   NA 134 (L) 04/29/2023   CL 102 04/29/2023   K 4.3  04/29/2023   CO2 24 04/29/2023   BUN 33 (H) 04/29/2023   CREATININE 1.27 (H) 04/29/2023   GFRNONAA 55 (L) 04/29/2023   CALCIUM 9.3 04/29/2023   PHOS 3.5 12/24/2022   ALBUMIN 4.1 04/29/2023   GLUCOSE 150 (H) 04/29/2023   Assessment & Plan:   Problem List Items Addressed This Visit     Type 2 diabetes mellitus with neurological manifestations, controlled (HCC) - Primary    Chronic, stable on  current regimen. Not regularly checking sugars.  Will request latest eye exam from early 2024.  Continue metformin and glipizide XL       Relevant Orders   POCT glycosylated hemoglobin (Hb A1C) (Completed)   Essential hypertension, benign    Chronic, BP today above goal despite amlodipine and losartan.  Encouraged limiting salt/sodium in the diet and good water intake.       Vitamin B12 deficiency    Continues vit b12 replacement daily       Hearing loss    L>R. Update hearing screen today - failed.  Recommend audiology evaluation and likely hearing aides. He will investigate and let me know preferred audiologist.       Other Visit Diagnoses     Encounter for immunization       Relevant Orders   Flu Vaccine Trivalent High Dose (Fluad) (Completed)        Meds ordered this encounter  Medications   cyanocobalamin (VITAMIN B12) 1000 MCG tablet    Sig: Take 1 tablet (1,000 mcg total) by mouth daily.    Orders Placed This Encounter  Procedures   Flu Vaccine Trivalent High Dose (Fluad)   POCT glycosylated hemoglobin (Hb A1C)    Patient Instructions  Flu shot today You are doing well today Hearing screen today.  Good to see you today  Return in 6 months for physical/wellness visit   Follow up plan: Return in about 6 months (around 01/02/2024) for annual exam, prior fasting for blood work, medicare wellness visit.  Eustaquio Boyden, MD

## 2023-07-05 NOTE — Assessment & Plan Note (Addendum)
Chronic, BP today above goal despite amlodipine and losartan.  Encouraged limiting salt/sodium in the diet and good water intake.

## 2023-07-05 NOTE — Assessment & Plan Note (Addendum)
Continues vit b12 replacement daily

## 2023-07-05 NOTE — Patient Instructions (Addendum)
Flu shot today You are doing well today Hearing screen today.  Good to see you today  Return in 6 months for physical/wellness visit

## 2023-10-25 ENCOUNTER — Other Ambulatory Visit: Payer: Self-pay

## 2023-10-25 ENCOUNTER — Encounter (HOSPITAL_BASED_OUTPATIENT_CLINIC_OR_DEPARTMENT_OTHER): Payer: Self-pay | Admitting: Emergency Medicine

## 2023-10-25 ENCOUNTER — Emergency Department (HOSPITAL_BASED_OUTPATIENT_CLINIC_OR_DEPARTMENT_OTHER): Admitting: Radiology

## 2023-10-25 ENCOUNTER — Inpatient Hospital Stay (HOSPITAL_BASED_OUTPATIENT_CLINIC_OR_DEPARTMENT_OTHER)
Admission: EM | Admit: 2023-10-25 | Discharge: 2023-11-01 | DRG: 516 | Disposition: A | Discharge status: Skilled Nursing Facility | Attending: General Surgery | Admitting: General Surgery

## 2023-10-25 ENCOUNTER — Emergency Department (HOSPITAL_BASED_OUTPATIENT_CLINIC_OR_DEPARTMENT_OTHER)

## 2023-10-25 DIAGNOSIS — S2242XA Multiple fractures of ribs, left side, initial encounter for closed fracture: Secondary | ICD-10-CM | POA: Diagnosis not present

## 2023-10-25 DIAGNOSIS — Z79899 Other long term (current) drug therapy: Secondary | ICD-10-CM

## 2023-10-25 DIAGNOSIS — Z96642 Presence of left artificial hip joint: Secondary | ICD-10-CM | POA: Diagnosis present

## 2023-10-25 DIAGNOSIS — Z8701 Personal history of pneumonia (recurrent): Secondary | ICD-10-CM

## 2023-10-25 DIAGNOSIS — N400 Enlarged prostate without lower urinary tract symptoms: Secondary | ICD-10-CM | POA: Diagnosis present

## 2023-10-25 DIAGNOSIS — Z7982 Long term (current) use of aspirin: Secondary | ICD-10-CM

## 2023-10-25 DIAGNOSIS — W132XXA Fall from, out of or through roof, initial encounter: Secondary | ICD-10-CM | POA: Diagnosis present

## 2023-10-25 DIAGNOSIS — E785 Hyperlipidemia, unspecified: Secondary | ICD-10-CM | POA: Diagnosis present

## 2023-10-25 DIAGNOSIS — Z85828 Personal history of other malignant neoplasm of skin: Secondary | ICD-10-CM

## 2023-10-25 DIAGNOSIS — Z8249 Family history of ischemic heart disease and other diseases of the circulatory system: Secondary | ICD-10-CM

## 2023-10-25 DIAGNOSIS — N179 Acute kidney failure, unspecified: Secondary | ICD-10-CM

## 2023-10-25 DIAGNOSIS — Z602 Problems related to living alone: Secondary | ICD-10-CM | POA: Diagnosis present

## 2023-10-25 DIAGNOSIS — S32119A Unspecified Zone I fracture of sacrum, initial encounter for closed fracture: Principal | ICD-10-CM | POA: Diagnosis present

## 2023-10-25 DIAGNOSIS — Z87891 Personal history of nicotine dependence: Secondary | ICD-10-CM

## 2023-10-25 DIAGNOSIS — H9190 Unspecified hearing loss, unspecified ear: Secondary | ICD-10-CM | POA: Diagnosis present

## 2023-10-25 DIAGNOSIS — D62 Acute posthemorrhagic anemia: Secondary | ICD-10-CM | POA: Diagnosis not present

## 2023-10-25 DIAGNOSIS — E875 Hyperkalemia: Secondary | ICD-10-CM

## 2023-10-25 DIAGNOSIS — Z833 Family history of diabetes mellitus: Secondary | ICD-10-CM

## 2023-10-25 DIAGNOSIS — N183 Chronic kidney disease, stage 3 unspecified: Secondary | ICD-10-CM | POA: Diagnosis present

## 2023-10-25 DIAGNOSIS — E1122 Type 2 diabetes mellitus with diabetic chronic kidney disease: Secondary | ICD-10-CM | POA: Diagnosis present

## 2023-10-25 DIAGNOSIS — N5089 Other specified disorders of the male genital organs: Secondary | ICD-10-CM | POA: Diagnosis present

## 2023-10-25 DIAGNOSIS — R911 Solitary pulmonary nodule: Secondary | ICD-10-CM | POA: Diagnosis present

## 2023-10-25 DIAGNOSIS — W19XXXA Unspecified fall, initial encounter: Secondary | ICD-10-CM | POA: Diagnosis present

## 2023-10-25 DIAGNOSIS — I129 Hypertensive chronic kidney disease with stage 1 through stage 4 chronic kidney disease, or unspecified chronic kidney disease: Secondary | ICD-10-CM | POA: Diagnosis present

## 2023-10-25 DIAGNOSIS — Z800 Family history of malignant neoplasm of digestive organs: Secondary | ICD-10-CM

## 2023-10-25 DIAGNOSIS — S329XXA Fracture of unspecified parts of lumbosacral spine and pelvis, initial encounter for closed fracture: Secondary | ICD-10-CM

## 2023-10-25 DIAGNOSIS — Z7984 Long term (current) use of oral hypoglycemic drugs: Secondary | ICD-10-CM

## 2023-10-25 DIAGNOSIS — S32592A Other specified fracture of left pubis, initial encounter for closed fracture: Secondary | ICD-10-CM | POA: Diagnosis present

## 2023-10-25 LAB — CBC WITH DIFFERENTIAL/PLATELET
Abs Immature Granulocytes: 0.13 10*3/uL — ABNORMAL HIGH (ref 0.00–0.07)
Basophils Absolute: 0 10*3/uL (ref 0.0–0.1)
Basophils Relative: 0 %
Eosinophils Absolute: 0 10*3/uL (ref 0.0–0.5)
Eosinophils Relative: 0 %
HCT: 34.2 % — ABNORMAL LOW (ref 39.0–52.0)
Hemoglobin: 11.6 g/dL — ABNORMAL LOW (ref 13.0–17.0)
Immature Granulocytes: 1 %
Lymphocytes Relative: 6 %
Lymphs Abs: 1 10*3/uL (ref 0.7–4.0)
MCH: 31.9 pg (ref 26.0–34.0)
MCHC: 33.9 g/dL (ref 30.0–36.0)
MCV: 94 fL (ref 80.0–100.0)
Monocytes Absolute: 1.4 10*3/uL — ABNORMAL HIGH (ref 0.1–1.0)
Monocytes Relative: 8 %
Neutro Abs: 15.1 10*3/uL — ABNORMAL HIGH (ref 1.7–7.7)
Neutrophils Relative %: 85 %
Platelets: 156 10*3/uL (ref 150–400)
RBC: 3.64 MIL/uL — ABNORMAL LOW (ref 4.22–5.81)
RDW: 13.6 % (ref 11.5–15.5)
WBC: 17.9 10*3/uL — ABNORMAL HIGH (ref 4.0–10.5)
nRBC: 0 % (ref 0.0–0.2)

## 2023-10-25 LAB — BASIC METABOLIC PANEL
Anion gap: 9 (ref 5–15)
BUN: 30 mg/dL — ABNORMAL HIGH (ref 8–23)
CO2: 24 mmol/L (ref 22–32)
Calcium: 9.6 mg/dL (ref 8.9–10.3)
Chloride: 104 mmol/L (ref 98–111)
Creatinine, Ser: 1.98 mg/dL — ABNORMAL HIGH (ref 0.61–1.24)
GFR, Estimated: 32 mL/min — ABNORMAL LOW (ref >60)
Glucose, Bld: 216 mg/dL — ABNORMAL HIGH (ref 70–99)
Potassium: 6.1 mmol/L — ABNORMAL HIGH (ref 3.5–5.1)
Sodium: 137 mmol/L (ref 135–145)

## 2023-10-25 MED ORDER — IOHEXOL 300 MG/ML  SOLN
100.0000 mL | Freq: Once | INTRAMUSCULAR | Status: AC | PRN
  Administered 2023-10-25: 100 mL via INTRAVENOUS

## 2023-10-25 MED ORDER — SODIUM ZIRCONIUM CYCLOSILICATE 10 G PO PACK
10.0000 g | PACK | Freq: Once | ORAL | Status: AC
  Administered 2023-10-26: 10 g via ORAL
  Filled 2023-10-25: qty 1

## 2023-10-25 MED ORDER — LACTATED RINGERS IV BOLUS
500.0000 mL | Freq: Once | INTRAVENOUS | Status: AC
  Administered 2023-10-26: 500 mL via INTRAVENOUS

## 2023-10-25 NOTE — ED Provider Notes (Signed)
  Physical Exam  BP (!) 109/58 (BP Location: Right Arm)   Pulse 78   Temp 98 F (36.7 C) (Oral)   Resp 20   SpO2 90%   Physical Exam  Procedures  Procedures  ED Course / MDM   Clinical Course as of 10/26/23 0354  Tue Oct 25, 2023  2349 Creatinine(!): 1.98 [JL]  2349 BUN(!): 30 [JL]  2349 Glucose(!): 216 [JL]  2349 Potassium(!): 6.1 [JL]  Wed Oct 26, 2023  0353 CK Total(!): 577 [JL]    Clinical Course User Index [JL] Ernie Avena, MD   Medical Decision Making Amount and/or Complexity of Data Reviewed Labs: ordered. Decision-making details documented in ED Course. Radiology: ordered.  Risk Prescription drug management. Decision regarding hospitalization.   32M jumped off a 6 foot roof to avoid scaffolding during a fall. Fell onto left side. Hx of hip replacement. Pain on attempts at bearing weight. Rib pain, hip pain. Getting trauma scans. Not on AC.  CT Head and C Spine: IMPRESSION: 1. No acute intracranial abnormality. 2. No acute fracture or static subluxation of the cervical spine.  CT C/A/P:  IMPRESSION: 1. Acute nondisplaced anterior left second through sixth rib fractures. No pneumothorax. 2. Acute displaced left superior and inferior pubic rami fractures. 3. Acute nondisplaced left sacral ala fracture. 4. Hemorrhage in the presacral space, left pelvic sidewall and anterior extraperitoneal pelvis. 5. Diffuse bladder wall thickening worrisome for cystitis. 6. Right upper lobe pulmonary nodule measuring 9 x 8 mm. Consider one of the following in 3 months for both low-risk and high-risk individuals: (a) repeat chest CT, (b) follow-up PET-CT, or (c) tissue sampling. This recommendation follows the consensus statement: Guidelines for Management of Incidental Pulmonary Nodules Detected on CT Images: From the Fleischner Society 2017; Radiology 2017; 284:228-243.  Aortic Atherosclerosis (ICD10-I70.0) and Emphysema (ICD10-J43.9).  Trauma surgery consulted  for admission for pain control, PT and OT.  Laboratory evaluation also revealed evidence of an AKI and mild hyperkalemia for which the patient was administered Lokelma as he had no EKG changes.  Spoke with Dr. Dossie Der today who accepted patient admission to the trauma service.  Patient updated regarding the plan of care.  Pain controlled at time of admission.  Potassium improved to 5.6 on recheck.  CK was ordered due to concern for potential rhabdomyolysis.     Ernie Avena, MD 10/26/23 [phone]

## 2023-10-25 NOTE — ED Provider Notes (Signed)
 [place] [facility] AT [facility] Provider Note   CSN: [id number] Arrival date & time: 10/25/23  2118     History  Chief Complaint  Patient presents with   John Sanford is a 89 y.o. male.  Patient is an 89 year old male with a history of hypertension, hyperlipidemia, diabetes, BPH who does not take any anticoagulation other than a baby aspirin who is presenting today after falling approximately 6 feet off of a well house.  He reports that he was sawing a piece of rotten wood off of the roof when he started losing his balance and he did not want to hit the scaffolding that was next to the roof so he jumped off landing on the ground.  He landed on his left side and then fell to the ground and thinks his arm hit his ribs.  He is having pain when he takes a deep breath but denies any shortness of breath.  He reports severe pain when he attempts to put any weight on his left leg.  He has had a prior hip replacement and reports as long as he is laying in the bed he feels okay but if he tries to stand it is very uncomfortable.  He denies any neck or back pain but does report that earlier he felt the strange sensation in his head even though he does not recall hitting his head.  This only occurred after the fall.  The history is provided by the patient and medical records.  Fall       Home Medications Prior to Admission medications   Medication Sig Start Date End Date Taking? Authorizing Provider  amLODipine (NORVASC) 5 MG tablet Take 1 tablet (5 mg total) by mouth daily. 01/05/23   Eustaquio Boyden, MD  aspirin EC 81 MG tablet Take 1 tablet (81 mg total) by mouth daily. Swallow whole. 01/05/23   Eustaquio Boyden, MD  Cholecalciferol (VITAMIN D3) 25 MCG (1000 UT) CAPS Take 1 capsule (1,000 Units total) by mouth daily. 01/05/23   Eustaquio Boyden, MD  cyanocobalamin (VITAMIN B12) 1000 MCG tablet Take 1 tablet (1,000 mcg total) by mouth daily. 07/05/23    Eustaquio Boyden, MD  docusate sodium (COLACE) 100 MG capsule Take 100 mg by mouth daily. OTC    [provider]  glipiZIDE (GLUCOTROL XL) 5 MG 24 hr tablet Take 1 tablet (5 mg total) by mouth daily with breakfast. 01/05/23   Eustaquio Boyden, MD  losartan (COZAAR) 50 MG tablet Take 1 tablet (50 mg total) by mouth daily. 01/05/23   Eustaquio Boyden, MD  metFORMIN (GLUCOPHAGE) 500 MG tablet Take 1 tablet (500 mg total) by mouth 2 (two) times daily with a meal. 01/05/23   Eustaquio Boyden, MD  polyethylene glycol powder [facility]) 17 GM/SCOOP powder Take 8.5-17 g by mouth daily as needed for moderate constipation. (1/2-1 capful) 01/05/23   Eustaquio Boyden, MD  pravastatin (PRAVACHOL) 80 MG tablet Take 1 tablet (80 mg total) by mouth daily. 01/05/23   Eustaquio Boyden, MD  tamsulosin (FLOMAX) 0.4 MG CAPS capsule Take 1 capsule (0.4 mg total) by mouth daily. 01/05/23   Eustaquio Boyden, MD      Allergies    Patient has no known allergies.    Review of Systems   Review of Systems  Physical Exam Updated Vital Signs BP (!) 109/58 (BP Location: Right Arm)   Pulse 78   Temp 98 F (36.7 C) (Oral)   Resp 20   SpO2  90%  Physical Exam Vitals and nursing note reviewed.  Constitutional:      General: He is not in acute distress.    Appearance: He is well-developed.  HENT:     Head: Normocephalic and atraumatic.  Eyes:     Conjunctiva/sclera: Conjunctivae normal.     Pupils: Pupils are equal, round, and reactive to light.  Cardiovascular:     Rate and Rhythm: Normal rate and regular rhythm.     Pulses: Normal pulses.     Heart sounds: No murmur heard. Pulmonary:     Effort: Pulmonary effort is normal. No respiratory distress.     Breath sounds: Normal breath sounds. No wheezing or rales.  Chest:     Chest wall: Tenderness present.    Abdominal:     General: There is no distension.     Palpations: Abdomen is soft.     Tenderness: There is no abdominal tenderness.  There is no right CVA tenderness, left CVA tenderness, guarding or rebound.  Musculoskeletal:        General: Tenderness present. Normal range of motion.     Right shoulder: Normal.     Left shoulder: Normal.     Cervical back: Normal range of motion and neck supple. No tenderness. No spinous process tenderness or muscular tenderness.     Right hip: Normal.     Left hip: Tenderness present.     Right knee: Normal.     Left knee: Normal.     Right ankle: Normal.     Left ankle: Normal.     Comments: Tenderness with left hip flexion.  No deformity  Skin:    General: Skin is warm and dry.     Findings: No erythema or rash.  Neurological:     Mental Status: He is alert and oriented to person, place, and time. Mental status is at baseline.     Sensory: No sensory deficit.     Motor: No weakness.  Psychiatric:        Behavior: Behavior normal.     ED Results / Procedures / Treatments   Labs (all labs ordered are listed, but only abnormal results are displayed) Labs Reviewed  CBC WITH DIFFERENTIAL/PLATELET  BASIC METABOLIC PANEL    EKG None  Radiology No results found.  Procedures Procedures    Medications Ordered in ED Medications - No data to display  ED Course/ Medical Decision Making/ A&P                                 Medical Decision Making Amount and/or Complexity of Data Reviewed Labs: ordered. Radiology: ordered.   Pt with multiple medical problems and comorbidities and presenting today with a complaint that caries a high risk for morbidity and mortality.  Here today after a fall of 6 feet onto the ground now with severe left hip pain and with some left rib pain.  Patient takes aspirin but no other anticoagulation.  Vital signs are reassuring.  Patient does not desire any pain medication at this time.  Given patient's mechanism, age and he also describes a strain sensation in his head after falling we will do a CT of the head, chest abdomen and pelvis.   Patient is able to move his arms and legs and low suspicion for spinal injury.  Concern for pelvic fracture, rib fracture.         Final Clinical Impression(s) / ED  Diagnoses Final diagnoses:  None    Rx / DC Orders ED Discharge Orders     None         Gwyneth Sprout, MD 10/25/23 2326

## 2023-10-25 NOTE — ED Triage Notes (Signed)
 FELL OFF  a roof  Lost balance 66ft fall Pain in left hip And left rib pain  Denies hitting head, no loc Was able to stand after falling  About 6 PM  Takes daily asa

## 2023-10-26 ENCOUNTER — Inpatient Hospital Stay (HOSPITAL_COMMUNITY)

## 2023-10-26 DIAGNOSIS — Z79899 Other long term (current) drug therapy: Secondary | ICD-10-CM | POA: Diagnosis not present

## 2023-10-26 DIAGNOSIS — M1611 Unilateral primary osteoarthritis, right hip: Secondary | ICD-10-CM | POA: Diagnosis not present

## 2023-10-26 DIAGNOSIS — N178 Other acute kidney failure: Secondary | ICD-10-CM | POA: Diagnosis not present

## 2023-10-26 DIAGNOSIS — Z743 Need for continuous supervision: Secondary | ICD-10-CM | POA: Diagnosis not present

## 2023-10-26 DIAGNOSIS — Z7984 Long term (current) use of oral hypoglycemic drugs: Secondary | ICD-10-CM | POA: Diagnosis not present

## 2023-10-26 DIAGNOSIS — H9190 Unspecified hearing loss, unspecified ear: Secondary | ICD-10-CM | POA: Diagnosis not present

## 2023-10-26 DIAGNOSIS — Z8701 Personal history of pneumonia (recurrent): Secondary | ICD-10-CM | POA: Diagnosis not present

## 2023-10-26 DIAGNOSIS — S32811A Multiple fractures of pelvis with unstable disruption of pelvic ring, initial encounter for closed fracture: Secondary | ICD-10-CM | POA: Diagnosis not present

## 2023-10-26 DIAGNOSIS — E1122 Type 2 diabetes mellitus with diabetic chronic kidney disease: Secondary | ICD-10-CM | POA: Diagnosis not present

## 2023-10-26 DIAGNOSIS — S32592D Other specified fracture of left pubis, subsequent encounter for fracture with routine healing: Secondary | ICD-10-CM | POA: Diagnosis not present

## 2023-10-26 DIAGNOSIS — S32592A Other specified fracture of left pubis, initial encounter for closed fracture: Secondary | ICD-10-CM | POA: Diagnosis not present

## 2023-10-26 DIAGNOSIS — E875 Hyperkalemia: Secondary | ICD-10-CM | POA: Diagnosis not present

## 2023-10-26 DIAGNOSIS — M25552 Pain in left hip: Secondary | ICD-10-CM | POA: Diagnosis not present

## 2023-10-26 DIAGNOSIS — W19XXXA Unspecified fall, initial encounter: Secondary | ICD-10-CM | POA: Diagnosis present

## 2023-10-26 DIAGNOSIS — S72002A Fracture of unspecified part of neck of left femur, initial encounter for closed fracture: Secondary | ICD-10-CM | POA: Diagnosis not present

## 2023-10-26 DIAGNOSIS — S329XXA Fracture of unspecified parts of lumbosacral spine and pelvis, initial encounter for closed fracture: Secondary | ICD-10-CM | POA: Diagnosis not present

## 2023-10-26 DIAGNOSIS — N179 Acute kidney failure, unspecified: Secondary | ICD-10-CM | POA: Diagnosis not present

## 2023-10-26 DIAGNOSIS — R531 Weakness: Secondary | ICD-10-CM | POA: Diagnosis not present

## 2023-10-26 DIAGNOSIS — S32119A Unspecified Zone I fracture of sacrum, initial encounter for closed fracture: Secondary | ICD-10-CM | POA: Diagnosis not present

## 2023-10-26 DIAGNOSIS — I129 Hypertensive chronic kidney disease with stage 1 through stage 4 chronic kidney disease, or unspecified chronic kidney disease: Secondary | ICD-10-CM | POA: Diagnosis not present

## 2023-10-26 DIAGNOSIS — D62 Acute posthemorrhagic anemia: Secondary | ICD-10-CM | POA: Diagnosis not present

## 2023-10-26 DIAGNOSIS — R0781 Pleurodynia: Secondary | ICD-10-CM | POA: Diagnosis not present

## 2023-10-26 DIAGNOSIS — I10 Essential (primary) hypertension: Secondary | ICD-10-CM | POA: Diagnosis not present

## 2023-10-26 DIAGNOSIS — S2242XD Multiple fractures of ribs, left side, subsequent encounter for fracture with routine healing: Secondary | ICD-10-CM | POA: Diagnosis not present

## 2023-10-26 DIAGNOSIS — Z7401 Bed confinement status: Secondary | ICD-10-CM | POA: Diagnosis not present

## 2023-10-26 DIAGNOSIS — E785 Hyperlipidemia, unspecified: Secondary | ICD-10-CM | POA: Diagnosis not present

## 2023-10-26 DIAGNOSIS — E119 Type 2 diabetes mellitus without complications: Secondary | ICD-10-CM | POA: Diagnosis not present

## 2023-10-26 DIAGNOSIS — S32110D Nondisplaced Zone I fracture of sacrum, subsequent encounter for fracture with routine healing: Secondary | ICD-10-CM | POA: Diagnosis not present

## 2023-10-26 DIAGNOSIS — Z7982 Long term (current) use of aspirin: Secondary | ICD-10-CM | POA: Diagnosis not present

## 2023-10-26 DIAGNOSIS — Z800 Family history of malignant neoplasm of digestive organs: Secondary | ICD-10-CM | POA: Diagnosis not present

## 2023-10-26 DIAGNOSIS — N3090 Cystitis, unspecified without hematuria: Secondary | ICD-10-CM | POA: Diagnosis not present

## 2023-10-26 DIAGNOSIS — Z85828 Personal history of other malignant neoplasm of skin: Secondary | ICD-10-CM | POA: Diagnosis not present

## 2023-10-26 DIAGNOSIS — S3219XA Other fracture of sacrum, initial encounter for closed fracture: Secondary | ICD-10-CM | POA: Diagnosis not present

## 2023-10-26 DIAGNOSIS — R911 Solitary pulmonary nodule: Secondary | ICD-10-CM | POA: Diagnosis not present

## 2023-10-26 DIAGNOSIS — Z87891 Personal history of nicotine dependence: Secondary | ICD-10-CM | POA: Diagnosis not present

## 2023-10-26 DIAGNOSIS — N5089 Other specified disorders of the male genital organs: Secondary | ICD-10-CM | POA: Diagnosis not present

## 2023-10-26 DIAGNOSIS — Z96642 Presence of left artificial hip joint: Secondary | ICD-10-CM | POA: Diagnosis not present

## 2023-10-26 DIAGNOSIS — S32512A Fracture of superior rim of left pubis, initial encounter for closed fracture: Secondary | ICD-10-CM | POA: Diagnosis not present

## 2023-10-26 DIAGNOSIS — Z833 Family history of diabetes mellitus: Secondary | ICD-10-CM | POA: Diagnosis not present

## 2023-10-26 DIAGNOSIS — S32512D Fracture of superior rim of left pubis, subsequent encounter for fracture with routine healing: Secondary | ICD-10-CM | POA: Diagnosis not present

## 2023-10-26 DIAGNOSIS — S2242XA Multiple fractures of ribs, left side, initial encounter for closed fracture: Secondary | ICD-10-CM | POA: Diagnosis not present

## 2023-10-26 DIAGNOSIS — N183 Chronic kidney disease, stage 3 unspecified: Secondary | ICD-10-CM | POA: Diagnosis not present

## 2023-10-26 DIAGNOSIS — W132XXA Fall from, out of or through roof, initial encounter: Secondary | ICD-10-CM | POA: Diagnosis not present

## 2023-10-26 DIAGNOSIS — N400 Enlarged prostate without lower urinary tract symptoms: Secondary | ICD-10-CM | POA: Diagnosis not present

## 2023-10-26 DIAGNOSIS — W19XXXD Unspecified fall, subsequent encounter: Secondary | ICD-10-CM | POA: Diagnosis not present

## 2023-10-26 DIAGNOSIS — Z8249 Family history of ischemic heart disease and other diseases of the circulatory system: Secondary | ICD-10-CM | POA: Diagnosis not present

## 2023-10-26 LAB — CBC
HCT: 29 % — ABNORMAL LOW (ref 39.0–52.0)
Hemoglobin: 10 g/dL — ABNORMAL LOW (ref 13.0–17.0)
MCH: 32.1 pg (ref 26.0–34.0)
MCHC: 34.5 g/dL (ref 30.0–36.0)
MCV: 92.9 fL (ref 80.0–100.0)
Platelets: 141 10*3/uL — ABNORMAL LOW (ref 150–400)
RBC: 3.12 MIL/uL — ABNORMAL LOW (ref 4.22–5.81)
RDW: 13.8 % (ref 11.5–15.5)
WBC: 10.7 10*3/uL — ABNORMAL HIGH (ref 4.0–10.5)
nRBC: 0 % (ref 0.0–0.2)

## 2023-10-26 LAB — BASIC METABOLIC PANEL
Anion gap: 8 (ref 5–15)
BUN: 31 mg/dL — ABNORMAL HIGH (ref 8–23)
CO2: 27 mmol/L (ref 22–32)
Calcium: 8.7 mg/dL — ABNORMAL LOW (ref 8.9–10.3)
Chloride: 104 mmol/L (ref 98–111)
Creatinine, Ser: 1.82 mg/dL — ABNORMAL HIGH (ref 0.61–1.24)
GFR, Estimated: 35 mL/min — ABNORMAL LOW (ref >60)
Glucose, Bld: 161 mg/dL — ABNORMAL HIGH (ref 70–99)
Potassium: 4.7 mmol/L (ref 3.5–5.1)
Sodium: 139 mmol/L (ref 135–145)

## 2023-10-26 LAB — URINALYSIS, ROUTINE W REFLEX MICROSCOPIC
Bacteria, UA: NONE SEEN
Bilirubin Urine: NEGATIVE
Glucose, UA: NEGATIVE mg/dL
Leukocytes,Ua: NEGATIVE
Nitrite: NEGATIVE
Protein, ur: 30 mg/dL — AB
Specific Gravity, Urine: >1.046 — ABNORMAL HIGH (ref 1.005–1.030)
pH: 5 (ref 5.0–8.0)

## 2023-10-26 LAB — CK: Total CK: 577 U/L — ABNORMAL HIGH (ref 49–397)

## 2023-10-26 LAB — POTASSIUM: Potassium: 5.6 mmol/L — ABNORMAL HIGH (ref 3.5–5.1)

## 2023-10-26 LAB — CBG MONITORING, ED
Glucose-Capillary: 152 mg/dL — ABNORMAL HIGH (ref 70–99)
Glucose-Capillary: 155 mg/dL — ABNORMAL HIGH (ref 70–99)

## 2023-10-26 MED ORDER — OXYCODONE HCL 5 MG PO TABS
2.5000 mg | ORAL_TABLET | ORAL | Status: DC | PRN
  Administered 2023-10-27: 5 mg via ORAL
  Filled 2023-10-26 (×3): qty 1

## 2023-10-26 MED ORDER — AMLODIPINE BESYLATE 5 MG PO TABS
5.0000 mg | ORAL_TABLET | Freq: Every day | ORAL | Status: DC
  Administered 2023-10-26 – 2023-11-01 (×7): 5 mg via ORAL
  Filled 2023-10-26 (×7): qty 1

## 2023-10-26 MED ORDER — METFORMIN HCL 500 MG PO TABS
500.0000 mg | ORAL_TABLET | Freq: Two times a day (BID) | ORAL | Status: DC
Start: 2023-10-26 — End: 2023-10-26
  Filled 2023-10-26: qty 1

## 2023-10-26 MED ORDER — ASPIRIN 81 MG PO TBEC
81.0000 mg | DELAYED_RELEASE_TABLET | Freq: Every day | ORAL | Status: DC
  Administered 2023-10-28 – 2023-11-01 (×5): 81 mg via ORAL
  Filled 2023-10-26 (×5): qty 1

## 2023-10-26 MED ORDER — ACETAMINOPHEN 500 MG PO TABS
1000.0000 mg | ORAL_TABLET | Freq: Three times a day (TID) | ORAL | Status: DC
  Administered 2023-10-26 – 2023-11-01 (×16): 1000 mg via ORAL
  Filled 2023-10-26 (×18): qty 2

## 2023-10-26 MED ORDER — GLIPIZIDE ER 5 MG PO TB24
5.0000 mg | ORAL_TABLET | Freq: Every day | ORAL | Status: DC
Start: 2023-10-26 — End: 2023-11-02
  Administered 2023-10-28 – 2023-11-01 (×5): 5 mg via ORAL
  Filled 2023-10-26 (×7): qty 1

## 2023-10-26 MED ORDER — METHOCARBAMOL 500 MG PO TABS
500.0000 mg | ORAL_TABLET | Freq: Three times a day (TID) | ORAL | Status: DC
  Administered 2023-10-26 – 2023-11-01 (×18): 500 mg via ORAL
  Filled 2023-10-26 (×19): qty 1

## 2023-10-26 MED ORDER — TAMSULOSIN HCL 0.4 MG PO CAPS
0.4000 mg | ORAL_CAPSULE | Freq: Every day | ORAL | Status: DC
Start: 2023-10-26 — End: 2023-11-02
  Administered 2023-10-26 – 2023-11-01 (×7): 0.4 mg via ORAL
  Filled 2023-10-26 (×7): qty 1

## 2023-10-26 MED ORDER — LIDOCAINE 5 % EX PTCH
1.0000 | MEDICATED_PATCH | CUTANEOUS | Status: DC
  Administered 2023-10-26 – 2023-10-30 (×4): 1 via TRANSDERMAL
  Filled 2023-10-26 (×6): qty 1

## 2023-10-26 MED ORDER — LOSARTAN POTASSIUM 50 MG PO TABS
50.0000 mg | ORAL_TABLET | Freq: Every day | ORAL | Status: DC
  Administered 2023-10-26 – 2023-11-01 (×7): 50 mg via ORAL
  Filled 2023-10-26: qty 2
  Filled 2023-10-26 (×6): qty 1

## 2023-10-26 MED ORDER — METFORMIN HCL 500 MG PO TABS: 500.0000 mg | ORAL_TABLET | Freq: Two times a day (BID) | ORAL | Status: DC

## 2023-10-26 MED ORDER — MORPHINE SULFATE (PF) 2 MG/ML IV SOLN
2.0000 mg | INTRAVENOUS | Status: DC | PRN
  Administered 2023-10-26: 2 mg via INTRAVENOUS
  Filled 2023-10-26: qty 1

## 2023-10-26 MED ORDER — DOCUSATE SODIUM 100 MG PO CAPS
100.0000 mg | ORAL_CAPSULE | Freq: Every day | ORAL | Status: DC
  Administered 2023-10-26 – 2023-10-28 (×3): 100 mg via ORAL
  Filled 2023-10-26 (×3): qty 1

## 2023-10-26 NOTE — ED Notes (Signed)
 [facility] at bedside

## 2023-10-26 NOTE — ED Notes (Signed)
 Attempt to call report  Floor RN unavailable.  To call back

## 2023-10-26 NOTE — Progress Notes (Signed)
 Ortho Trauma Note  Consult received for pelvic fracture. Formal consult to follow tomorrow AM. Will make NPO after midnight and assess in the morning to see if patient will need possible fixation of his pelvis.  Roby Lofts, MD [facility] [phone] (office) [email or web address]

## 2023-10-26 NOTE — H&P (Signed)
 H&P Note  John Sanford 06/28/1933  [id number].    Requesting MD: Ernie Avena, MD Chief Complaint/Reason for Consult: fall from roof HPI:  Patient is an 89 year old male who presented to [facility] s/p fall from roof over a well house. Fell approximately 6 ft. He reported he was sawing a piece of rotten wood off the roof when he lost his balance and jumped to avoid the scaffolding next the the roof landing on his left side. Did not recall hitting his head. He reports pain with deep inspiration but denies SOB. He reports severe pain with trying to put weight on his LLE. PMH significant for HTN, HLD, BPH, T2DM and previous left hip replacement. Not on blood thinners. NKDA. Reported rare alcohol use and is a former but not current smoker, denied illicit drug use. 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   ROS: Negative other than HPI  Family History  Problem Relation Age of Onset   Heart disease Mother    Diabetes Father    Heart disease Father    Diabetes Brother    Heart disease Brother    Pancreatic cancer Brother        22 yo    Past Medical History:  Diagnosis Date   Allergy    Arthritis    BPH (benign prostatic hypertrophy)    Cancer (HCC)    hx of skin cancer    Diabetes mellitus    type 2    Hyperlipidemia    Hypertension    Pneumonia    hx of     Past Surgical History:  Procedure Laterality Date   CATARACT EXTRACTION W/ INTRAOCULAR LENS IMPLANT     COLONOSCOPY     PROSTATE BIOPSY     negative   TONSILLECTOMY AND ADENOIDECTOMY     TOTAL HIP ARTHROPLASTY Left 07/29/2020   Procedure: LEFT TOTAL HIP ARTHROPLASTY ANTERIOR APPROACH;  Surgeon: Marcene Corning, MD;  Location: [facility];  Service: Orthopedics;  Laterality: Left;   VENTRAL HERNIA REPAIR     periumbilical    Social History:  reports that he quit smoking about 51 years ago. His smoking use included cigarettes. He started smoking about 71 years ago. He has a 40 pack-year smoking history.  He has never used smokeless tobacco. He reports that he does not drink alcohol and does not use drugs.  Allergies: No Known Allergies  (Not in a hospital admission)   Blood pressure (!) 139/57, pulse 81, temperature 98 F (36.7 C), temperature source Oral, resp. rate (!) 21, SpO2 92%. Physical Exam:  General: pleasant, WD, elderly male who is laying in bed in NAD HEENT: head is normocephalic, atraumatic.  Sclera are noninjected.  PERRL.  Ears and nose without any masses or lesions.  Mouth is pink and moist Heart: regular, rate, and rhythm.  Normal s1,s2. No obvious murmurs, gallops, or rubs noted.  Palpable radial and pedal pulses bilaterally Lungs: CTAB, no wheezes, rhonchi, or rales noted.  Respiratory effort nonlabored, left chest pain with inspiration Abd: soft, NT, ND, +BS, no masses, hernias, or organomegaly MS: all 4 extremities are symmetrical with no cyanosis, clubbing, or edema, pain with active LLE movement Skin: warm and dry with no masses, lesions, or rashes Neuro: Cranial nerves 2-12 grossly intact, sensation is normal throughout Psych: A&Ox3 with an appropriate affect.   Results for orders placed or performed during the hospital encounter of 10/25/23 (from the past 48 hours)  Potassium  Status: Abnormal   Collection Time: 10/25/23 12:05 AM  Result Value Ref Range   Potassium 5.6 (H) 3.5 - 5.1 mmol/L    Comment: Performed at [facility], [address] [place], [place] [address]  CBC with Differential/Platelet     Status: Abnormal   Collection Time: 10/25/23 10:27 PM  Result Value Ref Range   WBC 17.9 (H) 4.0 - 10.5 K/uL   RBC 3.64 (L) 4.22 - 5.81 MIL/uL   Hemoglobin 11.6 (L) 13.0 - 17.0 g/dL   HCT 57.8 (L) 46.9 - 62.9 %   MCV 94.0 80.0 - 100.0 fL   MCH 31.9 26.0 - 34.0 pg   MCHC 33.9 30.0 - 36.0 g/dL   RDW 52.8 41.3 - 24.4 %   Platelets 156 150 - 400 K/uL   nRBC 0.0 0.0 - 0.2 %   Neutrophils Relative % 85 %   Neutro Abs 15.1 (H) 1.7 -  7.7 K/uL   Lymphocytes Relative 6 %   Lymphs Abs 1.0 0.7 - 4.0 K/uL   Monocytes Relative 8 %   Monocytes Absolute 1.4 (H) 0.1 - 1.0 K/uL   Eosinophils Relative 0 %   Eosinophils Absolute 0.0 0.0 - 0.5 K/uL   Basophils Relative 0 %   Basophils Absolute 0.0 0.0 - 0.1 K/uL   Immature Granulocytes 1 %   Abs Immature Granulocytes 0.13 (H) 0.00 - 0.07 K/uL    Comment: Performed at [facility], [address], [place], [place] [address]  Basic metabolic panel     Status: Abnormal   Collection Time: 10/25/23 10:27 PM  Result Value Ref Range   Sodium 137 135 - 145 mmol/L   Potassium 6.1 (H) 3.5 - 5.1 mmol/L   Chloride 104 98 - 111 mmol/L   CO2 24 22 - 32 mmol/L   Glucose, Bld 216 (H) 70 - 99 mg/dL    Comment: Glucose reference range applies only to samples taken after fasting for at least 8 hours.   BUN 30 (H) 8 - 23 mg/dL   Creatinine, Ser 2.53 (H) 0.61 - 1.24 mg/dL   Calcium 9.6 8.9 - 66.4 mg/dL   GFR, Estimated 32 (L) >60 mL/min    Comment: (NOTE) Calculated using the CKD-EPI Creatinine Equation (2021)    Anion gap 9 5 - 15    Comment: Performed at [facility], [address] [place], [place] [address]  CK     Status: Abnormal   Collection Time: 10/26/23  3:13 AM  Result Value Ref Range   Total CK 577 (H) 49 - 397 U/L    Comment: Performed at [facility], [address] [place], [place] [address]  CBG monitoring, ED     Status: Abnormal   Collection Time: 10/26/23  7:43 AM  Result Value Ref Range   Glucose-Capillary 152 (H) 70 - 99 mg/dL    Comment: Glucose reference range applies only to samples taken after fasting for at least 8 hours.  Basic metabolic panel     Status: Abnormal   Collection Time: 10/26/23  8:02 AM  Result Value Ref Range   Sodium 139 135 - 145 mmol/L   Potassium 4.7 3.5 - 5.1 mmol/L    Comment: REPEATED TO VERIFY DELTA CHECK NOTED NO VISIBLE HEMOLYSIS    Chloride 104 98 - 111 mmol/L   CO2  27 22 - 32 mmol/L   Glucose, Bld 161 (H) 70 - 99 mg/dL    Comment: Glucose reference range applies only  to samples taken after fasting for at least 8 hours.   BUN 31 (H) 8 - 23 mg/dL   Creatinine, Ser 1.61 (H) 0.61 - 1.24 mg/dL   Calcium 8.7 (L) 8.9 - 10.3 mg/dL   GFR, Estimated 35 (L) >60 mL/min    Comment: (NOTE) Calculated using the CKD-EPI Creatinine Equation (2021)    Anion gap 8 5 - 15    Comment: Performed at [facility], [address] [place], [place] [address]  Urinalysis, Routine w reflex microscopic -Urine, Clean Catch     Status: Abnormal   Collection Time: 10/26/23  8:17 AM  Result Value Ref Range   Color, Urine YELLOW YELLOW   APPearance CLEAR CLEAR   Specific Gravity, Urine >1.046 (H) 1.005 - 1.030   pH 5.0 5.0 - 8.0   Glucose, UA NEGATIVE NEGATIVE mg/dL   Hgb urine dipstick MODERATE (A) NEGATIVE   Bilirubin Urine NEGATIVE NEGATIVE   Ketones, ur TRACE (A) NEGATIVE mg/dL   Protein, ur 30 (A) NEGATIVE mg/dL   Nitrite NEGATIVE NEGATIVE   Leukocytes,Ua NEGATIVE NEGATIVE   RBC / HPF 0-5 0 - 5 RBC/hpf   WBC, UA 0-5 0 - 5 WBC/hpf   Bacteria, UA NONE SEEN NONE SEEN   Squamous Epithelial / HPF 0-5 0 - 5 /HPF   Hyaline Casts, UA PRESENT    Granular Casts, UA PRESENT     Comment: Performed at [facility], [address] [place], [place] [address]   CT CHEST ABDOMEN PELVIS W CONTRAST Result Date: 10/26/2023 CLINICAL DATA:  Trauma EXAM: CT CHEST, ABDOMEN, AND PELVIS WITH CONTRAST TECHNIQUE: Multidetector CT imaging of the chest, abdomen and pelvis was performed following the standard protocol during bolus administration of intravenous contrast. RADIATION DOSE REDUCTION: This exam was performed according to the departmental dose-optimization program which includes automated exposure control, adjustment of the mA and/or kV according to patient size and/or use of iterative reconstruction technique. CONTRAST:  OMNIPAQUE IOHEXOL 300  MG/ML  SOLN COMPARISON:  None Available. FINDINGS: CT CHEST FINDINGS Cardiovascular: No significant vascular findings. Normal heart size. No pericardial effusion. There are atherosclerotic calcifications of the aorta and coronary arteries. Mediastinum/Nodes: No enlarged mediastinal, hilar, or axillary lymph nodes. Thyroid gland, trachea, and esophagus demonstrate no significant findings. Lungs/Pleura: There is mild emphysema. There is a right upper lobe slightly spiculated nodular density measuring 9 x 8 mm image 4/52. There are few scattered peripheral reticular opacities in the lung bases, nonspecific. There is no pleural effusion or pneumothorax. Musculoskeletal: There are likely nondisplaced anterior left second through sixth rib fractures. No focal body wall hematoma. CT ABDOMEN PELVIS FINDINGS Hepatobiliary: No hepatic injury or perihepatic hematoma. Gallbladder is unremarkable. Pancreas: Unremarkable. No pancreatic ductal dilatation or surrounding inflammatory changes. Spleen: No splenic injury or perisplenic hematoma. Adrenals/Urinary Tract: No adrenal hemorrhage or renal injury identified. Bladder is unremarkable. There is a right renal cyst measuring 1.8 cm. There is diffuse bladder wall thickening. Stomach/Bowel: Stomach is within normal limits. Appendix appears normal. No evidence of bowel wall thickening, distention, or inflammatory changes. Vascular/Lymphatic: Aortic atherosclerosis. No enlarged abdominal or pelvic lymph nodes. Reproductive: Prostate is unremarkable. Other: There is presacral hemorrhage. There is also hemorrhage along the left pelvic sidewall and within the anterior extraperitoneal pelvis. There is no ascites or free air. Anterior abdominal wall mesh is present. Musculoskeletal: Left hip arthroplasty is present. There is an acute left inferior pubic ramus fracture with fracture fragments distracted 7 mm. There is a markedly comminuted left superior  pubic ramus fracture with major  fracture fragments distracted 1.5 cm. Pubic symphysis and sacroiliac joints appears intact. There is also nondisplaced left sacral ala fracture. Multilevel degenerative changes affect the spine. IMPRESSION: 1. Acute nondisplaced anterior left second through sixth rib fractures. No pneumothorax. 2. Acute displaced left superior and inferior pubic rami fractures. 3. Acute nondisplaced left sacral ala fracture. 4. Hemorrhage in the presacral space, left pelvic sidewall and anterior extraperitoneal pelvis. 5. Diffuse bladder wall thickening worrisome for cystitis. 6. Right upper lobe pulmonary nodule measuring 9 x 8 mm. Consider one of the following in 3 months for both low-risk and high-risk individuals: (a) repeat chest CT, (b) follow-up PET-CT, or (c) tissue sampling. This recommendation follows the consensus statement: Guidelines for Management of Incidental Pulmonary Nodules Detected on CT Images: From the Fleischner Society 2017; Radiology 2017; 284:228-243. Aortic Atherosclerosis (ICD10-I70.0) and Emphysema (ICD10-J43.9). Electronically Signed   By: Darliss Cheney M.D.   On: 10/26/2023 01:24   CT Head Wo Contrast Result Date: 10/26/2023 CLINICAL DATA:  Trauma.  Fall from roof EXAM: CT HEAD WITHOUT CONTRAST CT CERVICAL SPINE WITHOUT CONTRAST TECHNIQUE: Multidetector CT imaging of the head and cervical spine was performed following the standard protocol without intravenous contrast. Multiplanar CT image reconstructions of the cervical spine were also generated. RADIATION DOSE REDUCTION: This exam was performed according to the departmental dose-optimization program which includes automated exposure control, adjustment of the mA and/or kV according to patient size and/or use of iterative reconstruction technique. COMPARISON:  None Available. FINDINGS: CT HEAD FINDINGS Brain: No mass,hemorrhage or extra-axial collection. Normal appearance of the parenchyma and CSF spaces. Vascular: No hyperdense vessel or  unexpected vascular calcification. Skull: The visualized skull base, calvarium and extracranial soft tissues are normal. Sinuses/Orbits: No fluid levels or advanced mucosal thickening of the visualized paranasal sinuses. No mastoid or middle ear effusion. Normal orbits. Other: None. CT CERVICAL SPINE FINDINGS Alignment: Grade 1 retrolisthesis at C3-4. Grade 1 anterolisthesis at C4-5. Skull base and vertebrae: No acute fracture. Soft tissues and spinal canal: No prevertebral fluid or swelling. No visible canal hematoma. Disc levels: No advanced spinal canal or neural foraminal stenosis. Upper chest: Emphysema Other: Normal visualized paraspinal cervical soft tissues. IMPRESSION: 1. No acute intracranial abnormality. 2. No acute fracture or static subluxation of the cervical spine. Electronically Signed   By: Deatra Robinson M.D.   On: 10/26/2023 01:03   CT Cervical Spine Wo Contrast Result Date: 10/26/2023 CLINICAL DATA:  Trauma.  Fall from roof EXAM: CT HEAD WITHOUT CONTRAST CT CERVICAL SPINE WITHOUT CONTRAST TECHNIQUE: Multidetector CT imaging of the head and cervical spine was performed following the standard protocol without intravenous contrast. Multiplanar CT image reconstructions of the cervical spine were also generated. RADIATION DOSE REDUCTION: This exam was performed according to the departmental dose-optimization program which includes automated exposure control, adjustment of the mA and/or kV according to patient size and/or use of iterative reconstruction technique. COMPARISON:  None Available. FINDINGS: CT HEAD FINDINGS Brain: No mass,hemorrhage or extra-axial collection. Normal appearance of the parenchyma and CSF spaces. Vascular: No hyperdense vessel or unexpected vascular calcification. Skull: The visualized skull base, calvarium and extracranial soft tissues are normal. Sinuses/Orbits: No fluid levels or advanced mucosal thickening of the visualized paranasal sinuses. No mastoid or middle ear  effusion. Normal orbits. Other: None. CT CERVICAL SPINE FINDINGS Alignment: Grade 1 retrolisthesis at C3-4. Grade 1 anterolisthesis at C4-5. Skull base and vertebrae: No acute fracture. Soft tissues and spinal canal: No prevertebral fluid or swelling. No visible  canal hematoma. Disc levels: No advanced spinal canal or neural foraminal stenosis. Upper chest: Emphysema Other: Normal visualized paraspinal cervical soft tissues. IMPRESSION: 1. No acute intracranial abnormality. 2. No acute fracture or static subluxation of the cervical spine. Electronically Signed   By: Deatra Robinson M.D.   On: 10/26/2023 01:03      Assessment/Plan Fall off roof L2-6 rib fractures - multimodal pain control, IS, pulm toilet L superior and inferior pubic rami fractures - per orthopedic surgery Left sacral ala fracture with hemorrhage in pre-sacral space, left pelvic sidewall and anterior extraperitoneal pelvis - per orthopedic surgery, hgb 11.6 yesterday  Possible cystitis - UA not suggestive of UTI RUL pulmonary nodule - recommend outpatient follow up  BPH - home meds HTN - home meds and monitor BP HLD T2DM - ok to have glipizide, can resume metformin 3/14  FEN: reg diet VTE: ?LMWH if hgb stable ID: no current abx    I reviewed ED provider notes, last 24 h vitals and pain scores, last 48 h intake and output, last 24 h labs and trends, and last 24 h imaging results.  This care required high  level of medical decision making.  Please see Amion for pager number during day hours 7:00am-4:30pm

## 2023-10-26 NOTE — ED Notes (Signed)
 Called Thomas at [facility] for hosp transport 14:48

## 2023-10-26 NOTE — Progress Notes (Signed)
 Transition of Care [facility]) - CAGE-AID Screening   Patient Details  Name: John Sanford MRN: [id number] Date of Birth: 04-25-1933  Transition of Care [facility]) CM/SW Contact:    Katha Hamming, RN Phone Number: 10/26/2023, 7:32 PM   Clinical Narrative:  Denies drug and alcohol use, no resources indicated.  CAGE-AID Screening:    Have You Ever Felt You Ought to Cut Down on Your Drinking or Drug Use?: No Have People Annoyed You By Critizing Your Drinking Or Drug Use?: No Have You Felt Bad Or Guilty About Your Drinking Or Drug Use?: No Have You Ever Had a Drink or Used Drugs First Thing In The Morning to Steady Your Nerves or to Get Rid of a Hangover?: No CAGE-AID Score: 0  Substance Abuse Education Offered: No

## 2023-10-26 NOTE — ED Notes (Signed)
 Patient provided with urinal to collect a urine sample when able.

## 2023-10-27 ENCOUNTER — Encounter (HOSPITAL_COMMUNITY): Payer: Self-pay

## 2023-10-27 ENCOUNTER — Inpatient Hospital Stay (HOSPITAL_COMMUNITY)

## 2023-10-27 ENCOUNTER — Encounter (HOSPITAL_COMMUNITY): Admission: EM | Disposition: A | Discharge status: Skilled Nursing Facility | Payer: Self-pay | Source: Home / Self Care

## 2023-10-27 ENCOUNTER — Inpatient Hospital Stay (HOSPITAL_COMMUNITY): Admitting: Registered Nurse

## 2023-10-27 ENCOUNTER — Other Ambulatory Visit: Payer: Self-pay

## 2023-10-27 DIAGNOSIS — I129 Hypertensive chronic kidney disease with stage 1 through stage 4 chronic kidney disease, or unspecified chronic kidney disease: Secondary | ICD-10-CM | POA: Diagnosis not present

## 2023-10-27 DIAGNOSIS — N183 Chronic kidney disease, stage 3 unspecified: Secondary | ICD-10-CM | POA: Diagnosis not present

## 2023-10-27 DIAGNOSIS — S329XXA Fracture of unspecified parts of lumbosacral spine and pelvis, initial encounter for closed fracture: Secondary | ICD-10-CM

## 2023-10-27 DIAGNOSIS — Z87891 Personal history of nicotine dependence: Secondary | ICD-10-CM

## 2023-10-27 HISTORY — PX: ORIF PELVIC FRACTURE WITH PERCUTANEOUS SCREWS: SHX6800

## 2023-10-27 LAB — BASIC METABOLIC PANEL
Anion gap: 5 (ref 5–15)
BUN: 36 mg/dL — ABNORMAL HIGH (ref 8–23)
CO2: 24 mmol/L (ref 22–32)
Calcium: 7.8 mg/dL — ABNORMAL LOW (ref 8.9–10.3)
Chloride: 107 mmol/L (ref 98–111)
Creatinine, Ser: 1.93 mg/dL — ABNORMAL HIGH (ref 0.61–1.24)
GFR, Estimated: 33 mL/min — ABNORMAL LOW (ref >60)
Glucose, Bld: 167 mg/dL — ABNORMAL HIGH (ref 70–99)
Potassium: 4.2 mmol/L (ref 3.5–5.1)
Sodium: 136 mmol/L (ref 135–145)

## 2023-10-27 LAB — PREPARE RBC (CROSSMATCH)

## 2023-10-27 LAB — CBC
HCT: 25 % — ABNORMAL LOW (ref 39.0–52.0)
HCT: 24.8 % — ABNORMAL LOW (ref 39.0–52.0)
Hemoglobin: 8.4 g/dL — ABNORMAL LOW (ref 13.0–17.0)
Hemoglobin: 8.4 g/dL — ABNORMAL LOW (ref 13.0–17.0)
MCH: 31.3 pg (ref 26.0–34.0)
MCH: 32.4 pg (ref 26.0–34.0)
MCHC: 33.6 g/dL (ref 30.0–36.0)
MCHC: 33.9 g/dL (ref 30.0–36.0)
MCV: 93.3 fL (ref 80.0–100.0)
MCV: 95.8 fL (ref 80.0–100.0)
Platelets: 124 10*3/uL — ABNORMAL LOW (ref 150–400)
Platelets: 118 10*3/uL — ABNORMAL LOW (ref 150–400)
RBC: 2.68 MIL/uL — ABNORMAL LOW (ref 4.22–5.81)
RBC: 2.59 MIL/uL — ABNORMAL LOW (ref 4.22–5.81)
RDW: 14 % (ref 11.5–15.5)
RDW: 14.1 % (ref 11.5–15.5)
WBC: 11 10*3/uL — ABNORMAL HIGH (ref 4.0–10.5)
WBC: 11.3 10*3/uL — ABNORMAL HIGH (ref 4.0–10.5)
nRBC: 0 % (ref 0.0–0.2)
nRBC: 0 % (ref 0.0–0.2)

## 2023-10-27 LAB — SURGICAL PCR SCREEN
MRSA, PCR: NEGATIVE
Staphylococcus aureus: NEGATIVE

## 2023-10-27 LAB — GLUCOSE, CAPILLARY
Glucose-Capillary: 152 mg/dL — ABNORMAL HIGH (ref 70–99)
Glucose-Capillary: 156 mg/dL — ABNORMAL HIGH (ref 70–99)

## 2023-10-27 LAB — CREATININE, SERUM
Creatinine, Ser: 1.87 mg/dL — ABNORMAL HIGH (ref 0.61–1.24)
GFR, Estimated: 34 mL/min — ABNORMAL LOW (ref >60)

## 2023-10-27 SURGERY — CLOSED REDUCTION, PELVIS, WITH PERCUTANEOUS FIXATION
Anesthesia: General | Site: Hip | Laterality: Left

## 2023-10-27 MED ORDER — ENOXAPARIN SODIUM 40 MG/0.4ML IJ SOSY: 40.0000 mg | PREFILLED_SYRINGE | INTRAMUSCULAR | Status: DC

## 2023-10-27 MED ORDER — LACTATED RINGERS IV SOLN: INTRAVENOUS | Status: DC

## 2023-10-27 MED ORDER — FENTANYL CITRATE (PF) 250 MCG/5ML IJ SOLN
INTRAMUSCULAR | Status: DC | PRN
  Administered 2023-10-27 (×2): 50 ug via INTRAVENOUS

## 2023-10-27 MED ORDER — ONDANSETRON HCL 4 MG PO TABS: 4.0000 mg | ORAL_TABLET | Freq: Four times a day (QID) | ORAL | Status: DC | PRN

## 2023-10-27 MED ORDER — ORAL CARE MOUTH RINSE: 15.0000 mL | Freq: Once | OROMUCOSAL | Status: AC

## 2023-10-27 MED ORDER — PHENYLEPHRINE 80 MCG/ML (10ML) SYRINGE FOR IV PUSH (FOR BLOOD PRESSURE SUPPORT)
PREFILLED_SYRINGE | INTRAVENOUS | Status: DC | PRN
  Administered 2023-10-27 (×2): 80 ug via INTRAVENOUS
  Administered 2023-10-27: 160 ug via INTRAVENOUS
  Administered 2023-10-27: 80 ug via INTRAVENOUS

## 2023-10-27 MED ORDER — SODIUM CHLORIDE 0.9% FLUSH
3.0000 mL | Freq: Two times a day (BID) | INTRAVENOUS | Status: DC
  Administered 2023-10-27: 10 mL via INTRAVENOUS
  Administered 2023-10-27: 3 mL via INTRAVENOUS
  Administered 2023-10-28: 10 mL via INTRAVENOUS
  Administered 2023-10-29: 3 mL via INTRAVENOUS
  Administered 2023-10-29 – 2023-10-30 (×3): 10 mL via INTRAVENOUS
  Administered 2023-10-31: 3 mL via INTRAVENOUS
  Administered 2023-10-31 – 2023-11-01 (×2): 10 mL via INTRAVENOUS

## 2023-10-27 MED ORDER — SODIUM CHLORIDE 0.9% FLUSH: 3.0000 mL | INTRAVENOUS | Status: DC | PRN

## 2023-10-27 MED ORDER — CHLORHEXIDINE GLUCONATE 0.12 % MT SOLN: 15.0000 mL | Freq: Once | OROMUCOSAL | Status: AC

## 2023-10-27 MED ORDER — ENOXAPARIN SODIUM 30 MG/0.3ML IJ SOSY
30.0000 mg | PREFILLED_SYRINGE | INTRAMUSCULAR | Status: DC
  Administered 2023-10-28 – 2023-11-01 (×5): 30 mg via SUBCUTANEOUS
  Filled 2023-10-27 (×5): qty 0.3

## 2023-10-27 MED ORDER — CHLORHEXIDINE GLUCONATE 0.12 % MT SOLN
OROMUCOSAL | Status: AC
  Administered 2023-10-27: 15 mL via OROMUCOSAL
  Filled 2023-10-27: qty 15

## 2023-10-27 MED ORDER — SUGAMMADEX SODIUM 200 MG/2ML IV SOLN
INTRAVENOUS | Status: DC | PRN
  Administered 2023-10-27: 200 mg via INTRAVENOUS

## 2023-10-27 MED ORDER — ONDANSETRON HCL 4 MG/2ML IJ SOLN
INTRAMUSCULAR | Status: DC | PRN
  Administered 2023-10-27: 4 mg via INTRAVENOUS

## 2023-10-27 MED ORDER — ONDANSETRON HCL 4 MG/2ML IJ SOLN: 4.0000 mg | Freq: Four times a day (QID) | INTRAMUSCULAR | Status: DC | PRN

## 2023-10-27 MED ORDER — CEFAZOLIN SODIUM-DEXTROSE 2-4 GM/100ML-% IV SOLN
2.0000 g | INTRAVENOUS | Status: AC
  Administered 2023-10-27: 2 g via INTRAVENOUS

## 2023-10-27 MED ORDER — POLYETHYLENE GLYCOL 3350 17 G PO PACK
17.0000 g | PACK | Freq: Every day | ORAL | Status: DC | PRN
  Administered 2023-10-27: 17 g via ORAL
  Filled 2023-10-27: qty 1

## 2023-10-27 MED ORDER — METOCLOPRAMIDE HCL 5 MG PO TABS: 5.0000 mg | ORAL_TABLET | Freq: Three times a day (TID) | ORAL | Status: DC | PRN

## 2023-10-27 MED ORDER — CEFAZOLIN SODIUM-DEXTROSE 2-4 GM/100ML-% IV SOLN
2.0000 g | Freq: Two times a day (BID) | INTRAVENOUS | Status: AC
  Administered 2023-10-27 – 2023-10-28 (×2): 2 g via INTRAVENOUS
  Filled 2023-10-27 (×2): qty 100

## 2023-10-27 MED ORDER — SUGAMMADEX SODIUM 200 MG/2ML IV SOLN
INTRAVENOUS | Status: AC
  Filled 2023-10-27: qty 2

## 2023-10-27 MED ORDER — PHENYLEPHRINE HCL-NACL 20-0.9 MG/250ML-% IV SOLN
INTRAVENOUS | Status: DC | PRN
  Administered 2023-10-27: 20 ug/min via INTRAVENOUS

## 2023-10-27 MED ORDER — VANCOMYCIN HCL 1000 MG IV SOLR
INTRAVENOUS | Status: AC
  Filled 2023-10-27: qty 20

## 2023-10-27 MED ORDER — INSULIN ASPART 100 UNIT/ML IJ SOLN
0.0000 [IU] | INTRAMUSCULAR | Status: DC | PRN
Start: 2023-10-27 — End: 2023-10-27

## 2023-10-27 MED ORDER — METOCLOPRAMIDE HCL 5 MG/ML IJ SOLN: 5.0000 mg | Freq: Three times a day (TID) | INTRAMUSCULAR | Status: DC | PRN

## 2023-10-27 MED ORDER — SUCCINYLCHOLINE CHLORIDE 200 MG/10ML IV SOSY
PREFILLED_SYRINGE | INTRAVENOUS | Status: AC
  Filled 2023-10-27: qty 10

## 2023-10-27 MED ORDER — FENTANYL CITRATE (PF) 250 MCG/5ML IJ SOLN
INTRAMUSCULAR | Status: AC
  Filled 2023-10-27: qty 5

## 2023-10-27 MED ORDER — ACETAMINOPHEN 10 MG/ML IV SOLN
INTRAVENOUS | Status: DC | PRN
  Administered 2023-10-27: 1000 mg via INTRAVENOUS

## 2023-10-27 MED ORDER — ALBUMIN HUMAN 5 % IV SOLN: INTRAVENOUS | Status: DC | PRN

## 2023-10-27 MED ORDER — ONDANSETRON HCL 4 MG/2ML IJ SOLN
INTRAMUSCULAR | Status: AC
  Filled 2023-10-27: qty 2

## 2023-10-27 MED ORDER — LIDOCAINE 2% (20 MG/ML) 5 ML SYRINGE
INTRAMUSCULAR | Status: AC
  Filled 2023-10-27: qty 5

## 2023-10-27 MED ORDER — LIDOCAINE 2% (20 MG/ML) 5 ML SYRINGE
INTRAMUSCULAR | Status: DC | PRN
  Administered 2023-10-27: 100 mg via INTRAVENOUS

## 2023-10-27 MED ORDER — DEXAMETHASONE SODIUM PHOSPHATE 10 MG/ML IJ SOLN
INTRAMUSCULAR | Status: AC
Start: 2023-10-27 — End: ?
  Filled 2023-10-27: qty 1

## 2023-10-27 MED ORDER — ROCURONIUM BROMIDE 10 MG/ML (PF) SYRINGE
PREFILLED_SYRINGE | INTRAVENOUS | Status: AC
  Filled 2023-10-27: qty 10

## 2023-10-27 MED ORDER — PHENYLEPHRINE 80 MCG/ML (10ML) SYRINGE FOR IV PUSH (FOR BLOOD PRESSURE SUPPORT)
PREFILLED_SYRINGE | INTRAVENOUS | Status: AC
  Filled 2023-10-27: qty 10

## 2023-10-27 MED ORDER — CEFAZOLIN SODIUM-DEXTROSE 2-4 GM/100ML-% IV SOLN
INTRAVENOUS | Status: AC
  Filled 2023-10-27: qty 100

## 2023-10-27 MED ORDER — SODIUM CHLORIDE 0.9 % IV SOLN: INTRAVENOUS | Status: DC

## 2023-10-27 MED ORDER — PROPOFOL 10 MG/ML IV BOLUS
INTRAVENOUS | Status: DC | PRN
  Administered 2023-10-27: 100 mg via INTRAVENOUS

## 2023-10-27 MED ORDER — PROPOFOL 10 MG/ML IV BOLUS
INTRAVENOUS | Status: AC
  Filled 2023-10-27: qty 20

## 2023-10-27 MED ORDER — ROCURONIUM BROMIDE 10 MG/ML (PF) SYRINGE
PREFILLED_SYRINGE | INTRAVENOUS | Status: DC | PRN
Start: 2023-10-27 — End: 2023-10-27
  Administered 2023-10-27: 60 mg via INTRAVENOUS
  Administered 2023-10-27: 10 mg via INTRAVENOUS

## 2023-10-27 MED ORDER — DIPHENHYDRAMINE HCL 12.5 MG/5ML PO ELIX: 12.5000 mg | ORAL_SOLUTION | ORAL | Status: DC | PRN

## 2023-10-27 MED ORDER — 0.9 % SODIUM CHLORIDE (POUR BTL) OPTIME
TOPICAL | Status: DC | PRN
  Administered 2023-10-27: 1000 mL

## 2023-10-27 SURGICAL SUPPLY — 43 items
BAG COUNTER SPONGE SURGICOUNT (BAG) ×2 IMPLANT
BIT DRILL CANN 4.5MM (BIT) IMPLANT
BLADE CLIPPER SURG (BLADE) IMPLANT
BLADE SURG 11 STRL SS (BLADE) ×2 IMPLANT
CHLORAPREP W/TINT 26 (MISCELLANEOUS) ×2 IMPLANT
DERMABOND ADVANCED .7 DNX12 (GAUZE/BANDAGES/DRESSINGS) IMPLANT
DRAPE C-ARM 42X72 X-RAY (DRAPES) ×2 IMPLANT
DRAPE C-ARMOR (DRAPES) ×2 IMPLANT
DRAPE HALF SHEET 40X57 (DRAPES) ×2 IMPLANT
DRAPE INCISE IOBAN 66X45 STRL (DRAPES) ×2 IMPLANT
DRAPE SURG 17X23 STRL (DRAPES) ×12 IMPLANT
DRAPE U-SHAPE 47X51 STRL (DRAPES) ×2 IMPLANT
DRESSING MEPILEX FLEX 4X4 (GAUZE/BANDAGES/DRESSINGS) IMPLANT
DRSG MEPILEX FLEX 4X4 (GAUZE/BANDAGES/DRESSINGS) ×1 IMPLANT
DRSG MEPILEX POST OP 4X8 (GAUZE/BANDAGES/DRESSINGS) IMPLANT
DRSG TEGADERM 4X4.75 (GAUZE/BANDAGES/DRESSINGS) IMPLANT
ELECT REM PT RETURN 9FT ADLT (ELECTROSURGICAL) ×1 IMPLANT
ELECTRODE REM PT RTRN 9FT ADLT (ELECTROSURGICAL) ×2 IMPLANT
GAUZE SPONGE 2X2 STRL 8-PLY (GAUZE/BANDAGES/DRESSINGS) IMPLANT
GLOVE BIO SURGEON STRL SZ 6.5 (GLOVE) ×6 IMPLANT
GLOVE BIO SURGEON STRL SZ7.5 (GLOVE) ×8 IMPLANT
GLOVE BIOGEL PI IND STRL 6.5 (GLOVE) ×2 IMPLANT
GLOVE BIOGEL PI IND STRL 7.5 (GLOVE) ×2 IMPLANT
GOWN STRL REUS W/ TWL LRG LVL3 (GOWN DISPOSABLE) ×4 IMPLANT
GUIDEWIRE 2.0MM (WIRE) IMPLANT
GUIDEWIRE 2.8 THREAD 450 L ST (WIRE) IMPLANT
KIT BASIN OR (CUSTOM PROCEDURE TRAY) ×2 IMPLANT
KIT TURNOVER KIT B (KITS) ×2 IMPLANT
MANIFOLD NEPTUNE II (INSTRUMENTS) ×2 IMPLANT
NS IRRIG 1000ML POUR BTL (IV SOLUTION) ×2 IMPLANT
PACK TOTAL JOINT (CUSTOM PROCEDURE TRAY) ×2 IMPLANT
PACK UNIVERSAL I (CUSTOM PROCEDURE TRAY) ×2 IMPLANT
PAD ARMBOARD 7.5X6 YLW CONV (MISCELLANEOUS) ×4 IMPLANT
SCREW CANN FT 6.5X110 (Screw) IMPLANT
SCREW CANN FT 7.5X150 STRL (Screw) IMPLANT
SPONGE T-LAP 18X18 ~~LOC~~+RFID (SPONGE) IMPLANT
STAPLER VISISTAT 35W (STAPLE) ×2 IMPLANT
SUCTION TUBE FRAZIER 10FR DISP (SUCTIONS) ×2 IMPLANT
SUT MNCRL AB 3-0 PS2 18 (SUTURE) ×2 IMPLANT
SUT MON AB 2-0 CT1 36 (SUTURE) ×2 IMPLANT
TRAY FOLEY MTR SLVR 16FR STAT (SET/KITS/TRAYS/PACK) IMPLANT
WASHER F/7.5 CANN SCREW (Washer) IMPLANT
WATER STERILE IRR 1000ML POUR (IV SOLUTION) ×2 IMPLANT

## 2023-10-27 NOTE — Interval H&P Note (Signed)
 History and Physical Interval Note:  10/27/2023 9:11 AM  John Sanford  has presented today for surgery, with the diagnosis of Pelvic fracture.  The various methods of treatment have been discussed with the patient and family. After consideration of risks, benefits and other options for treatment, the patient has consented to  Procedure(s): CLOSED REDUCTION, PELVIS, WITH PERCUTANEOUS FIXATION (Left) as a surgical intervention.  The patient's history has been reviewed, patient examined, no change in status, stable for surgery.  I have reviewed the patient's chart and labs.  Questions were answered to the patient's satisfaction.     Caryn Bee P Graves Nipp

## 2023-10-27 NOTE — Transfer of Care (Signed)
 Immediate Anesthesia Transfer of Care Note  Patient: John Sanford  Procedure(s) Performed: CLOSED REDUCTION, PELVIS, WITH PERCUTANEOUS FIXATION (Left: Hip)  Patient Location: PACU  Anesthesia Type:General  Level of Consciousness: drowsy and patient cooperative  Airway & Oxygen Therapy: Patient Spontanous Breathing  Post-op Assessment: Report given to RN and Post -op Vital signs reviewed and stable  Post vital signs: Reviewed and stable  Last Vitals:  Vitals Value Taken Time  BP 135/65 10/27/23 1050  Temp    Pulse 69 10/27/23 1052  Resp 15 10/27/23 1052  SpO2 92 % 10/27/23 1052  Vitals shown include unfiled device data.  Last Pain:  Vitals:   10/27/23 0730  TempSrc: Axillary  PainSc:          Complications: There were no known notable events for this encounter.

## 2023-10-27 NOTE — H&P (View-Only) (Signed)
 Orthopaedic Trauma Service (OTS) Consult   Patient ID: John Sanford MRN: [id number] DOB/AGE: 24-Sep-1932 89 y.o.  Reason for Consult:Pelvis Fracture Referring Physician: Dr. Axel Filler, MD [facility]  HPI: John Sanford is an 89 y.o. male who is being seen in consultation at the request of Dr. Derrell Lolling for pelvic fractures.  Patient was up about 6 feet down a rotten piece of wood and fell the landing on his hip had immediate pain inability bear weight was also found to have rib fractures was taken the [facility] room and was transferred here.  I was consulted due to his pelvic ring injury.  Patient is otherwise active ambulates with without significant assistance.  He had a total hip replacement approximately 3 years ago.  He lives alone.  His daughter and son are close by.  Patient was able to sit at the edge of the bed but had severe pain.  He is not really able to mobilize very well in bed otherwise.  Past Medical History:  Diagnosis Date   Allergy    Arthritis    BPH (benign prostatic hypertrophy)    Cancer (HCC)    hx of skin cancer    Diabetes mellitus    type 2    Hyperlipidemia    Hypertension    Pneumonia    hx of     Past Surgical History:  Procedure Laterality Date   CATARACT EXTRACTION W/ INTRAOCULAR LENS IMPLANT     COLONOSCOPY     PROSTATE BIOPSY     negative   TONSILLECTOMY AND ADENOIDECTOMY     TOTAL HIP ARTHROPLASTY Left 07/29/2020   Procedure: LEFT TOTAL HIP ARTHROPLASTY ANTERIOR APPROACH;  Surgeon: Marcene Corning, MD;  Location: [facility] ORS;  Service: Orthopedics;  Laterality: Left;   VENTRAL HERNIA REPAIR     periumbilical    Family History  Problem Relation Age of Onset   Heart disease Mother    Diabetes Father    Heart disease Father    Diabetes Brother    Heart disease Brother    Pancreatic cancer Brother        42 yo    Social History:  reports that he quit smoking about 51 years ago. His smoking use  included cigarettes. He started smoking about 71 years ago. He has a 40 pack-year smoking history. He has never used smokeless tobacco. He reports that he does not drink alcohol and does not use drugs.  Allergies: No Known Allergies  Medications:  No current facility-administered medications on file prior to encounter.   Current Outpatient Medications on File Prior to Encounter  Medication Sig Dispense Refill   amLODipine (NORVASC) 5 MG tablet Take 1 tablet (5 mg total) by mouth daily. 90 tablet 4   aspirin EC 81 MG tablet Take 1 tablet (81 mg total) by mouth daily. Swallow whole. 30 tablet 12   Cholecalciferol (VITAMIN D3) 25 MCG (1000 UT) CAPS Take 1 capsule (1,000 Units total) by mouth daily. 90 capsule 4   cyanocobalamin (VITAMIN B12) 1000 MCG tablet Take 1 tablet (1,000 mcg total) by mouth daily.     glipiZIDE (GLUCOTROL XL) 5 MG 24 hr tablet Take 1 tablet (5 mg total) by mouth daily with breakfast. 90 tablet 4   losartan (COZAAR) 50 MG tablet Take 1 tablet (50 mg total) by mouth daily. 90 tablet 4   metFORMIN (GLUCOPHAGE) 500 MG tablet Take 1 tablet (500 mg total) by mouth 2 (two) times daily with a meal.  180 tablet 4   polyethylene glycol powder (GLYCOLAX/MIRALAX) 17 GM/SCOOP powder Take 8.5-17 g by mouth daily as needed for moderate constipation. (1/2-1 capful) 3350 g 1   pravastatin (PRAVACHOL) 80 MG tablet Take 1 tablet (80 mg total) by mouth daily. 90 tablet 4   tamsulosin (FLOMAX) 0.4 MG CAPS capsule Take 1 capsule (0.4 mg total) by mouth daily. 90 capsule 4   docusate sodium (COLACE) 100 MG capsule Take 100 mg by mouth daily. OTC       ROS: Constitutional: No fever or chills Vision: No changes in vision ENT: No difficulty swallowing CV: No chest pain Pulm: No SOB or wheezing GI: No nausea or vomiting GU: No urgency or inability to hold urine Skin: No poor wound healing Neurologic: No numbness or tingling Psychiatric: No depression or anxiety Heme: No bruising Allergic:  No reaction to medications or food   Exam: Blood pressure (!) 133/53, pulse 80, temperature 98.3 F (36.8 C), resp. rate 20, height 5\' 6"  (1.676 m), weight 73.9 kg, SpO2 94%. General: No acute distress Orientation: Awake alert and oriented x 3 Mood and Affect: Cooperative and pleasant Gait: Unable to assess due to his fracture Coordination and balance: Within normal limits  Left lower extremity: Pain with any movement of the hip.  I did not stress his pelvis.  He was not able to roll on his side.  He vastly intact no deformity through his lower leg no significant skin lesions or lacerations.  Right lower extremity: Skin without lesions. No tenderness to palpation. Full painless ROM, full strength in each muscle groups without evidence of instability.   Medical Decision Making: Data: Imaging: X-rays and CT scan are reviewed which shows a lateral compression pelvic ring injury with significant comminution in the anterior ring.  Stress examination of the pelvis with a side view shows what appears to be significant displacement of the left hemipelvis.  Labs:  Results for orders placed or performed during the hospital encounter of 10/25/23 (from the past 24 hours)  CBG monitoring, ED     Status: Abnormal   Collection Time: 10/26/23 12:22 PM  Result Value Ref Range   Glucose-Capillary 155 (H) 70 - 99 mg/dL  CBC     Status: Abnormal   Collection Time: 10/26/23 12:23 PM  Result Value Ref Range   WBC 10.7 (H) 4.0 - 10.5 K/uL   RBC 3.12 (L) 4.22 - 5.81 MIL/uL   Hemoglobin 10.0 (L) 13.0 - 17.0 g/dL   HCT 29.5 (L) 62.1 - 30.8 %   MCV 92.9 80.0 - 100.0 fL   MCH 32.1 26.0 - 34.0 pg   MCHC 34.5 30.0 - 36.0 g/dL   RDW 65.7 84.6 - 96.2 %   Platelets 141 (L) 150 - 400 K/uL   nRBC 0.0 0.0 - 0.2 %  CBC     Status: Abnormal   Collection Time: 10/27/23  5:46 AM  Result Value Ref Range   WBC 11.0 (H) 4.0 - 10.5 K/uL   RBC 2.68 (L) 4.22 - 5.81 MIL/uL   Hemoglobin 8.4 (L) 13.0 - 17.0 g/dL   HCT  95.2 (L) 84.1 - 52.0 %   MCV 93.3 80.0 - 100.0 fL   MCH 31.3 26.0 - 34.0 pg   MCHC 33.6 30.0 - 36.0 g/dL   RDW 32.4 40.1 - 02.7 %   Platelets 124 (L) 150 - 400 K/uL   nRBC 0.0 0.0 - 0.2 %  Basic metabolic panel     Status: Abnormal  Collection Time: 10/27/23  5:46 AM  Result Value Ref Range   Sodium 136 135 - 145 mmol/L   Potassium 4.2 3.5 - 5.1 mmol/L   Chloride 107 98 - 111 mmol/L   CO2 24 22 - 32 mmol/L   Glucose, Bld 167 (H) 70 - 99 mg/dL   BUN 36 (H) 8 - 23 mg/dL   Creatinine, Ser 1.61 (H) 0.61 - 1.24 mg/dL   Calcium 7.8 (L) 8.9 - 10.3 mg/dL   GFR, Estimated 33 (L) >60 mL/min   Anion gap 5 5 - 15  Glucose, capillary     Status: Abnormal   Collection Time: 10/27/23  8:46 AM  Result Value Ref Range   Glucose-Capillary 152 (H) 70 - 99 mg/dL     Imaging or Labs ordered: None   Medical history and chart was reviewed and case discussed with medical provider.  Assessment/Plan: 89 year old male with left LC pelvic ring injury  Due to the highly comminuted anterior ring and the displacement on lateral stress views I feel that he is indicated for stress examination in the operating room with likely percutaneous fixation of the pelvis.  Risk and benefits were discussed with the patient his daughter and his son.  Risks included but not limited to bleeding, infection, malunion, nonunion, hardware failure, hardware irritation, nerve and blood vessel injury, DVT, even the possibility anesthetic complications.  I also discussed with him the possibility of nonoperative management with attempting to mobilize with physical therapy however due to his age and his high pain tolerance I think this could drag out his process even further and cause worsening issues including sarcopenia and atrophy and potential pneumonias and even death sores.  After discussion of this they wish to proceed with surgery consent was obtained.  Roby Lofts, MD [facility] [phone]  (office) [email or web address]

## 2023-10-27 NOTE — Anesthesia Procedure Notes (Signed)
 Procedure Name: Intubation Date/Time: 10/27/2023 9:41 AM  Performed by: Sharyn Dross, CRNAPre-anesthesia Checklist: Patient identified, Emergency Drugs available, Suction available and Patient being monitored Patient Re-evaluated:Patient Re-evaluated prior to induction Oxygen Delivery Method: Circle system utilized Preoxygenation: Pre-oxygenation with 100% oxygen Induction Type: IV induction Ventilation: Mask ventilation without difficulty Laryngoscope Size: Mac and 4 Grade View: Grade I Tube type: Oral Tube size: 7.5 mm Number of attempts: 1 Airway Equipment and Method: Stylet and Oral airway Placement Confirmation: ETT inserted through vocal cords under direct vision, positive ETCO2 and breath sounds checked- equal and bilateral Secured at: 23 cm Tube secured with: Tape Dental Injury: Teeth and Oropharynx as per pre-operative assessment  Comments: Intubation performed by [facility] paramedic student.

## 2023-10-27 NOTE — Op Note (Signed)
 Orthopaedic Surgery Operative Note (CSN: [id number] ) Date of Surgery: 10/27/2023  Admit Date: 10/25/2023   Diagnoses: Pre-Op Diagnoses: Lateral compression pelvic ring injury  Post-Op Diagnosis: Same  Procedures: CPT 27216-Percutaneous fixation of posterior pelvis CPT 27217-Percutaneous fixation of anterior pelvis  Surgeons : Primary: Roby Lofts, MD  Assistant: Thyra Breed, PA-C  Location: OR 3   Anesthesia: General   Antibiotics: Ancef 2g preop   Tourniquet time: None    Estimated Blood Loss: 50 mL  Complications:* No complications entered in OR log *   Specimens:* No specimens in log *   Implants: Implant Name Type Inv. Item Serial No. Manufacturer Lot No. LRB No. Used Action  WASHER F/7.5 CANN SCREW - [id number] Washer WASHER F/7.5 CANN SCREW  [employer] [id number] Left 1 Implanted  SCREW CANN FT 7.5X150 STRL - [id number] Screw SCREW CANN FT 7.5X150 STRL  [employer] [id number] Left 1 Implanted  SCREW CANN FT 6.5X110 - [id number] Screw SCREW CANN FT 6.5X110  [employer]  Left 1 Implanted     Indications for Surgery: 89 year old male who fell from a height and sustained a pelvic ring injury.  Due to the unstable nature of his injury I recommend proceeding with stress examination with possible percutaneous fixation of the pelvis.  Risk and benefits were discussed with the patient and his son and daughter.  Risks include but not limited to bleeding, infection, nerve and blood vessel injury, malunion, nonunion, hardware failure, hardware irritation, DVT, even the possibility anesthetic complications.  He agreed to proceed with surgery and consent was obtained.  Operative Findings: Stress examination showing unstable pelvic ring injury with significant motion at the anterior fracture. Percutaneous fixation of posterior pelvic ring using Synthes 7.5 mm fully threaded cannulated screw at S1 in a transsacral transiliac fashion. Percutaneous fixation of  left superior pubic ramus using a retrograde Synthes titanium 6.5 mm fully threaded screw  Procedure: The patient was identified in the preoperative holding area. Consent was confirmed with the patient and their family and all questions were answered. The operative extremity was marked after confirmation with the patient. he was then brought back to the operating room by our anesthesia colleagues.  He was placed under general anesthetic and carefully transferred over to radiolucent flattop table.  A sacral bump was placed under his pelvis to elevated to have the appropriate starting point of the screw.  Fluoroscopic imaging was obtained to adequately image the pelvis.  Stress examination was performed which showed significant motion at the front nearly 2 to 3 cm of motion.  At this point we felt that percutaneous fixation was most appropriate and the pelvis was prepped and draped in usual sterile fashion.  A timeout was performed to verify the patient, the procedure, and the extremity.  Preoperative antibiotics were dosed.  Using inlet and outlet views I directed a 2.0 mm guidewire at appropriate starting point for transsacral transiliac screw at S1.  I advanced into the bone approximately 1 cm.  And I cut down on this with 11 blade.  I then used a 4.5 mm cannulated drill bit to oscillate across the bone into the pelvis and sacrum.  I reached the far neuroforamen and the removed the drill bit and placed a threaded 2.8 mm guidewire.  I then advanced this across the right SI joint and then the right lateral ilium.  I then measured the length and chose to use 150 mm fully threaded 7.5 mm cannulated screw.  I placed this and obtained excellent  fixation.  I then repeated the process for a retrograde superior pubic ramus screw.  Using inlet views and obturator outlet views I directed a 2.0 mm guidewire at the appropriate starting point.  I advanced it into the bone approximately 1 cm and cut down on this with 11  blade.  I then used a 4.5 mm cannulated drill bit to oscillate across the bone across the fracture into the high superior pubic ramus.  I then removed the drill bit and used a bent 2.8 mm threaded guidewire and advanced it just superior to the acetabular component of his total hip and into the ilium.  I then measured the length and chose to use 110 mm fully threaded 6.5 mm cannulated screw.  This was placed and excellent fixation was obtained.  Fluoroscopic imaging was obtained and the stress examination was performed which showed no movement of the pelvis.  The incisions were irrigated and closed with 3-0 Monocryl and Dermabond.  Sterile dressings were applied.  The patient was then awoke from anesthesia and taken to the PACU in stable condition.  Post Op Plan/Instructions: Patient be weightbearing as tolerated to the right lower extremity.  He will be weightbearing for transfers to the left lower extremity.  He will receive postoperative Ancef.  He will be placed on Lovenox for DVT prophylaxis.  Will have him mobilize with physical and Occupational Therapy.  I was present and performed the entire surgery.  Thyra Breed, PA-C did assist me throughout the case. An assistant was necessary given the difficulty in approach, maintenance of reduction and ability to instrument the fracture.   Truitt Merle, MD [facility]

## 2023-10-27 NOTE — Anesthesia Preprocedure Evaluation (Addendum)
 Anesthesia Evaluation  Patient identified by MRN, date of birth, ID band Patient awake    Reviewed: Allergy & Precautions, NPO status , Patient's Chart, lab work & pertinent test results, reviewed documented beta blocker date and time   Airway Mallampati: I  TM Distance: >3 FB Neck ROM: Full    Dental no notable dental hx. (+) Edentulous Upper, Edentulous Lower   Pulmonary pneumonia, resolved, former smoker   Pulmonary exam normal breath sounds clear to auscultation       Cardiovascular hypertension, Pt. on medications Normal cardiovascular exam Rhythm:Regular Rate:Normal     Neuro/Psych Diabetic peripheral neuropathy  negative psych ROS   GI/Hepatic negative GI ROS, Neg liver ROS,,,  Endo/Other  diabetes, Well Controlled, Type 2, Oral Hypoglycemic Agents  Hyperlipidemia  Renal/GU Renal InsufficiencyRenal disease  negative genitourinary   Musculoskeletal  (+) Arthritis , Osteoarthritis,  OA left hip   Abdominal   Peds  Hematology  (+) Blood dyscrasia, anemia   Anesthesia Other Findings   Reproductive/Obstetrics                             Anesthesia Physical Anesthesia Plan  ASA: 4 and emergent  Anesthesia Plan: General   Post-op Pain Management: Ofirmev IV (intra-op)*   Induction: Intravenous  PONV Risk Score and Plan: 1 and Ondansetron and Treatment may vary due to age or medical condition  Airway Management Planned: Oral ETT  Additional Equipment: None  Intra-op Plan:   Post-operative Plan: Extubation in OR  Informed Consent: I have reviewed the patients History and Physical, chart, labs and discussed the procedure including the risks, benefits and alternatives for the proposed anesthesia with the patient or authorized representative who has indicated his/her understanding and acceptance.       Plan Discussed with: CRNA and Anesthesiologist  Anesthesia Plan Comments:  (  )        Anesthesia Quick Evaluation

## 2023-10-27 NOTE — Consult Note (Signed)
 Orthopaedic Trauma Service (OTS) Consult   Patient ID: John Sanford MRN: [id number] DOB/AGE: 24-Sep-1932 89 y.o.  Reason for Consult:Pelvis Fracture Referring Physician: Dr. Axel Filler, MD [facility]  HPI: REGIS HINTON is an 89 y.o. male who is being seen in consultation at the request of Dr. Derrell Lolling for pelvic fractures.  Patient was up about 6 feet down a rotten piece of wood and fell the landing on his hip had immediate pain inability bear weight was also found to have rib fractures was taken the [facility] room and was transferred here.  I was consulted due to his pelvic ring injury.  Patient is otherwise active ambulates with without significant assistance.  He had a total hip replacement approximately 3 years ago.  He lives alone.  His daughter and son are close by.  Patient was able to sit at the edge of the bed but had severe pain.  He is not really able to mobilize very well in bed otherwise.  Past Medical History:  Diagnosis Date   Allergy    Arthritis    BPH (benign prostatic hypertrophy)    Cancer (HCC)    hx of skin cancer    Diabetes mellitus    type 2    Hyperlipidemia    Hypertension    Pneumonia    hx of     Past Surgical History:  Procedure Laterality Date   CATARACT EXTRACTION W/ INTRAOCULAR LENS IMPLANT     COLONOSCOPY     PROSTATE BIOPSY     negative   TONSILLECTOMY AND ADENOIDECTOMY     TOTAL HIP ARTHROPLASTY Left 07/29/2020   Procedure: LEFT TOTAL HIP ARTHROPLASTY ANTERIOR APPROACH;  Surgeon: Marcene Corning, MD;  Location: [facility] ORS;  Service: Orthopedics;  Laterality: Left;   VENTRAL HERNIA REPAIR     periumbilical    Family History  Problem Relation Age of Onset   Heart disease Mother    Diabetes Father    Heart disease Father    Diabetes Brother    Heart disease Brother    Pancreatic cancer Brother        42 yo    Social History:  reports that he quit smoking about 51 years ago. His smoking use  included cigarettes. He started smoking about 71 years ago. He has a 40 pack-year smoking history. He has never used smokeless tobacco. He reports that he does not drink alcohol and does not use drugs.  Allergies: No Known Allergies  Medications:  No current facility-administered medications on file prior to encounter.   Current Outpatient Medications on File Prior to Encounter  Medication Sig Dispense Refill   amLODipine (NORVASC) 5 MG tablet Take 1 tablet (5 mg total) by mouth daily. 90 tablet 4   aspirin EC 81 MG tablet Take 1 tablet (81 mg total) by mouth daily. Swallow whole. 30 tablet 12   Cholecalciferol (VITAMIN D3) 25 MCG (1000 UT) CAPS Take 1 capsule (1,000 Units total) by mouth daily. 90 capsule 4   cyanocobalamin (VITAMIN B12) 1000 MCG tablet Take 1 tablet (1,000 mcg total) by mouth daily.     glipiZIDE (GLUCOTROL XL) 5 MG 24 hr tablet Take 1 tablet (5 mg total) by mouth daily with breakfast. 90 tablet 4   losartan (COZAAR) 50 MG tablet Take 1 tablet (50 mg total) by mouth daily. 90 tablet 4   metFORMIN (GLUCOPHAGE) 500 MG tablet Take 1 tablet (500 mg total) by mouth 2 (two) times daily with a meal.  180 tablet 4   polyethylene glycol powder (GLYCOLAX/MIRALAX) 17 GM/SCOOP powder Take 8.5-17 g by mouth daily as needed for moderate constipation. (1/2-1 capful) 3350 g 1   pravastatin (PRAVACHOL) 80 MG tablet Take 1 tablet (80 mg total) by mouth daily. 90 tablet 4   tamsulosin (FLOMAX) 0.4 MG CAPS capsule Take 1 capsule (0.4 mg total) by mouth daily. 90 capsule 4   docusate sodium (COLACE) 100 MG capsule Take 100 mg by mouth daily. OTC       ROS: Constitutional: No fever or chills Vision: No changes in vision ENT: No difficulty swallowing CV: No chest pain Pulm: No SOB or wheezing GI: No nausea or vomiting GU: No urgency or inability to hold urine Skin: No poor wound healing Neurologic: No numbness or tingling Psychiatric: No depression or anxiety Heme: No bruising Allergic:  No reaction to medications or food   Exam: Blood pressure (!) 133/53, pulse 80, temperature 98.3 F (36.8 C), resp. rate 20, height 5\' 6"  (1.676 m), weight 73.9 kg, SpO2 94%. General: No acute distress Orientation: Awake alert and oriented x 3 Mood and Affect: Cooperative and pleasant Gait: Unable to assess due to his fracture Coordination and balance: Within normal limits  Left lower extremity: Pain with any movement of the hip.  I did not stress his pelvis.  He was not able to roll on his side.  He vastly intact no deformity through his lower leg no significant skin lesions or lacerations.  Right lower extremity: Skin without lesions. No tenderness to palpation. Full painless ROM, full strength in each muscle groups without evidence of instability.   Medical Decision Making: Data: Imaging: X-rays and CT scan are reviewed which shows a lateral compression pelvic ring injury with significant comminution in the anterior ring.  Stress examination of the pelvis with a side view shows what appears to be significant displacement of the left hemipelvis.  Labs:  Results for orders placed or performed during the hospital encounter of 10/25/23 (from the past 24 hours)  CBG monitoring, ED     Status: Abnormal   Collection Time: 10/26/23 12:22 PM  Result Value Ref Range   Glucose-Capillary 155 (H) 70 - 99 mg/dL  CBC     Status: Abnormal   Collection Time: 10/26/23 12:23 PM  Result Value Ref Range   WBC 10.7 (H) 4.0 - 10.5 K/uL   RBC 3.12 (L) 4.22 - 5.81 MIL/uL   Hemoglobin 10.0 (L) 13.0 - 17.0 g/dL   HCT 29.5 (L) 62.1 - 30.8 %   MCV 92.9 80.0 - 100.0 fL   MCH 32.1 26.0 - 34.0 pg   MCHC 34.5 30.0 - 36.0 g/dL   RDW 65.7 84.6 - 96.2 %   Platelets 141 (L) 150 - 400 K/uL   nRBC 0.0 0.0 - 0.2 %  CBC     Status: Abnormal   Collection Time: 10/27/23  5:46 AM  Result Value Ref Range   WBC 11.0 (H) 4.0 - 10.5 K/uL   RBC 2.68 (L) 4.22 - 5.81 MIL/uL   Hemoglobin 8.4 (L) 13.0 - 17.0 g/dL   HCT  95.2 (L) 84.1 - 52.0 %   MCV 93.3 80.0 - 100.0 fL   MCH 31.3 26.0 - 34.0 pg   MCHC 33.6 30.0 - 36.0 g/dL   RDW 32.4 40.1 - 02.7 %   Platelets 124 (L) 150 - 400 K/uL   nRBC 0.0 0.0 - 0.2 %  Basic metabolic panel     Status: Abnormal  Collection Time: 10/27/23  5:46 AM  Result Value Ref Range   Sodium 136 135 - 145 mmol/L   Potassium 4.2 3.5 - 5.1 mmol/L   Chloride 107 98 - 111 mmol/L   CO2 24 22 - 32 mmol/L   Glucose, Bld 167 (H) 70 - 99 mg/dL   BUN 36 (H) 8 - 23 mg/dL   Creatinine, Ser 1.61 (H) 0.61 - 1.24 mg/dL   Calcium 7.8 (L) 8.9 - 10.3 mg/dL   GFR, Estimated 33 (L) >60 mL/min   Anion gap 5 5 - 15  Glucose, capillary     Status: Abnormal   Collection Time: 10/27/23  8:46 AM  Result Value Ref Range   Glucose-Capillary 152 (H) 70 - 99 mg/dL     Imaging or Labs ordered: None   Medical history and chart was reviewed and case discussed with medical provider.  Assessment/Plan: 89 year old male with left LC pelvic ring injury  Due to the highly comminuted anterior ring and the displacement on lateral stress views I feel that he is indicated for stress examination in the operating room with likely percutaneous fixation of the pelvis.  Risk and benefits were discussed with the patient his daughter and his son.  Risks included but not limited to bleeding, infection, malunion, nonunion, hardware failure, hardware irritation, nerve and blood vessel injury, DVT, even the possibility anesthetic complications.  I also discussed with him the possibility of nonoperative management with attempting to mobilize with physical therapy however due to his age and his high pain tolerance I think this could drag out his process even further and cause worsening issues including sarcopenia and atrophy and potential pneumonias and even death sores.  After discussion of this they wish to proceed with surgery consent was obtained.  Roby Lofts, MD [facility] [phone]  (office) [email or web address]

## 2023-10-27 NOTE — Plan of Care (Signed)

## 2023-10-27 NOTE — Progress Notes (Signed)
 Progress Note  * Day of Surgery *  Subjective: States he had some pain in his left lower leg when moving it but overall very comfortable. Had some rib pain preop with breathing but now states its gone. On supp O2 via [place] currently but no SHOB. Tolerated reg diet post op  Daughter and neighbor at bedside  Objective: Vital signs in last 24 hours: Temp:  [97.6 F (36.4 C)-98.6 F (37 C)] 98.3 F (36.8 C) (03/13 0842) Pulse Rate:  [73-97] 80 (03/13 0842) Resp:  [16-23] 20 (03/13 0842) BP: (101-139)/(42-62) 133/53 (03/13 0842) SpO2:  [91 %-97 %] 94 % (03/13 0842) Weight:  [73.9 kg] 73.9 kg (03/13 0842) Last BM Date : 10/25/23  Intake/Output from previous day: 03/12 0701 - 03/13 0700 In: -  Out: 180 [Urine:180] Intake/Output this shift: No intake/output data recorded.  PE: General: pleasant, WD, male who is laying in bed in NAD Lungs: CTAB, no wheezes, rhonchi, or rales noted.  Respiratory effort nonlabored on 2 lpm O2 Abd: soft, NT, ND MSK: all 4 extremities are symmetrical with no cyanosis, clubbing, or edema. Skin: warm and dry Psych: A&Ox3 with an appropriate affect.    Lab Results:  Recent Labs    10/26/23 1223 10/27/23 0546  WBC 10.7* 11.0*  HGB 10.0* 8.4*  HCT 29.0* 25.0*  PLT 141* 124*   BMET Recent Labs    10/26/23 0802 10/27/23 0546  NA 139 136  K 4.7 4.2  CL 104 107  CO2 27 24  GLUCOSE 161* 167*  BUN 31* 36*  CREATININE 1.82* 1.93*  CALCIUM 8.7* 7.8*   PT/INR No results for input(s): "LABPROT", "INR" in the last 72 hours. CMP     Component Value Date/Time   NA 136 10/27/2023 0546   K 4.2 10/27/2023 0546   CL 107 10/27/2023 0546   CO2 24 10/27/2023 0546   GLUCOSE 167 (H) 10/27/2023 0546   BUN 36 (H) 10/27/2023 0546   CREATININE 1.93 (H) 10/27/2023 0546   CALCIUM 7.8 (L) 10/27/2023 0546   PROT 7.4 04/29/2023 1014   ALBUMIN 4.1 04/29/2023 1014   AST 20 04/29/2023 1014   ALT 18 04/29/2023 1014   ALKPHOS 56 04/29/2023 1014   BILITOT  0.6 04/29/2023 1014   GFRNONAA 33 (L) 10/27/2023 0546   GFRAA >60 04/03/2020 1433   Lipase  No results found for: "LIPASE"     Studies/Results: DG Pelvis 1-2 Views Result Date: 10/26/2023 CLINICAL DATA:  Pelvic fracture. EXAM: PELVIS - 1-2 VIEW COMPARISON:  CT yesterday FINDINGS: Imaging obtained supine and right side down per request. Displaced fractures of left superior and inferior pubic rami, mildly comminuted. Fractures are not well delineated on the cross-table views. No visualized left sacral fracture. Left hip arthroplasty is intact were visualized IMPRESSION: Displaced left superior and inferior pubic rami fractures Electronically Signed   By: Narda Rutherford M.D.   On: 10/26/2023 21:57   CT CHEST ABDOMEN PELVIS W CONTRAST Result Date: 10/26/2023 CLINICAL DATA:  Trauma EXAM: CT CHEST, ABDOMEN, AND PELVIS WITH CONTRAST TECHNIQUE: Multidetector CT imaging of the chest, abdomen and pelvis was performed following the standard protocol during bolus administration of intravenous contrast. RADIATION DOSE REDUCTION: This exam was performed according to the departmental dose-optimization program which includes automated exposure control, adjustment of the mA and/or kV according to patient size and/or use of iterative reconstruction technique. CONTRAST:  OMNIPAQUE IOHEXOL 300 MG/ML  SOLN COMPARISON:  None Available. FINDINGS: CT CHEST FINDINGS Cardiovascular: No significant vascular  findings. Normal heart size. No pericardial effusion. There are atherosclerotic calcifications of the aorta and coronary arteries. Mediastinum/Nodes: No enlarged mediastinal, hilar, or axillary lymph nodes. Thyroid gland, trachea, and esophagus demonstrate no significant findings. Lungs/Pleura: There is mild emphysema. There is a right upper lobe slightly spiculated nodular density measuring 9 x 8 mm image 4/52. There are few scattered peripheral reticular opacities in the lung bases, nonspecific. There is no pleural  effusion or pneumothorax. Musculoskeletal: There are likely nondisplaced anterior left second through sixth rib fractures. No focal body wall hematoma. CT ABDOMEN PELVIS FINDINGS Hepatobiliary: No hepatic injury or perihepatic hematoma. Gallbladder is unremarkable. Pancreas: Unremarkable. No pancreatic ductal dilatation or surrounding inflammatory changes. Spleen: No splenic injury or perisplenic hematoma. Adrenals/Urinary Tract: No adrenal hemorrhage or renal injury identified. Bladder is unremarkable. There is a right renal cyst measuring 1.8 cm. There is diffuse bladder wall thickening. Stomach/Bowel: Stomach is within normal limits. Appendix appears normal. No evidence of bowel wall thickening, distention, or inflammatory changes. Vascular/Lymphatic: Aortic atherosclerosis. No enlarged abdominal or pelvic lymph nodes. Reproductive: Prostate is unremarkable. Other: There is presacral hemorrhage. There is also hemorrhage along the left pelvic sidewall and within the anterior extraperitoneal pelvis. There is no ascites or free air. Anterior abdominal wall mesh is present. Musculoskeletal: Left hip arthroplasty is present. There is an acute left inferior pubic ramus fracture with fracture fragments distracted 7 mm. There is a markedly comminuted left superior pubic ramus fracture with major fracture fragments distracted 1.5 cm. Pubic symphysis and sacroiliac joints appears intact. There is also nondisplaced left sacral ala fracture. Multilevel degenerative changes affect the spine. IMPRESSION: 1. Acute nondisplaced anterior left second through sixth rib fractures. No pneumothorax. 2. Acute displaced left superior and inferior pubic rami fractures. 3. Acute nondisplaced left sacral ala fracture. 4. Hemorrhage in the presacral space, left pelvic sidewall and anterior extraperitoneal pelvis. 5. Diffuse bladder wall thickening worrisome for cystitis. 6. Right upper lobe pulmonary nodule measuring 9 x 8 mm. Consider one  of the following in 3 months for both low-risk and high-risk individuals: (a) repeat chest CT, (b) follow-up PET-CT, or (c) tissue sampling. This recommendation follows the consensus statement: Guidelines for Management of Incidental Pulmonary Nodules Detected on CT Images: From the Fleischner Society 2017; Radiology 2017; 284:228-243. Aortic Atherosclerosis (ICD10-I70.0) and Emphysema (ICD10-J43.9). Electronically Signed   By: Darliss Cheney M.D.   On: 10/26/2023 01:24   CT Head Wo Contrast Result Date: 10/26/2023 CLINICAL DATA:  Trauma.  Fall from roof EXAM: CT HEAD WITHOUT CONTRAST CT CERVICAL SPINE WITHOUT CONTRAST TECHNIQUE: Multidetector CT imaging of the head and cervical spine was performed following the standard protocol without intravenous contrast. Multiplanar CT image reconstructions of the cervical spine were also generated. RADIATION DOSE REDUCTION: This exam was performed according to the departmental dose-optimization program which includes automated exposure control, adjustment of the mA and/or kV according to patient size and/or use of iterative reconstruction technique. COMPARISON:  None Available. FINDINGS: CT HEAD FINDINGS Brain: No mass,hemorrhage or extra-axial collection. Normal appearance of the parenchyma and CSF spaces. Vascular: No hyperdense vessel or unexpected vascular calcification. Skull: The visualized skull base, calvarium and extracranial soft tissues are normal. Sinuses/Orbits: No fluid levels or advanced mucosal thickening of the visualized paranasal sinuses. No mastoid or middle ear effusion. Normal orbits. Other: None. CT CERVICAL SPINE FINDINGS Alignment: Grade 1 retrolisthesis at C3-4. Grade 1 anterolisthesis at C4-5. Skull base and vertebrae: No acute fracture. Soft tissues and spinal canal: No prevertebral fluid or swelling. No visible  canal hematoma. Disc levels: No advanced spinal canal or neural foraminal stenosis. Upper chest: Emphysema Other: Normal visualized  paraspinal cervical soft tissues. IMPRESSION: 1. No acute intracranial abnormality. 2. No acute fracture or static subluxation of the cervical spine. Electronically Signed   By: Deatra Robinson M.D.   On: 10/26/2023 01:03   CT Cervical Spine Wo Contrast Result Date: 10/26/2023 CLINICAL DATA:  Trauma.  Fall from roof EXAM: CT HEAD WITHOUT CONTRAST CT CERVICAL SPINE WITHOUT CONTRAST TECHNIQUE: Multidetector CT imaging of the head and cervical spine was performed following the standard protocol without intravenous contrast. Multiplanar CT image reconstructions of the cervical spine were also generated. RADIATION DOSE REDUCTION: This exam was performed according to the departmental dose-optimization program which includes automated exposure control, adjustment of the mA and/or kV according to patient size and/or use of iterative reconstruction technique. COMPARISON:  None Available. FINDINGS: CT HEAD FINDINGS Brain: No mass,hemorrhage or extra-axial collection. Normal appearance of the parenchyma and CSF spaces. Vascular: No hyperdense vessel or unexpected vascular calcification. Skull: The visualized skull base, calvarium and extracranial soft tissues are normal. Sinuses/Orbits: No fluid levels or advanced mucosal thickening of the visualized paranasal sinuses. No mastoid or middle ear effusion. Normal orbits. Other: None. CT CERVICAL SPINE FINDINGS Alignment: Grade 1 retrolisthesis at C3-4. Grade 1 anterolisthesis at C4-5. Skull base and vertebrae: No acute fracture. Soft tissues and spinal canal: No prevertebral fluid or swelling. No visible canal hematoma. Disc levels: No advanced spinal canal or neural foraminal stenosis. Upper chest: Emphysema Other: Normal visualized paraspinal cervical soft tissues. IMPRESSION: 1. No acute intracranial abnormality. 2. No acute fracture or static subluxation of the cervical spine. Electronically Signed   By: Deatra Robinson M.D.   On: 10/26/2023 01:03     Anti-infectives: Anti-infectives (From admission, onward)    Start     Dose/Rate Route Frequency Ordered Stop   10/27/23 0900  [MAR Hold]  ceFAZolin (ANCEF) IVPB 2g/100 mL premix        (MAR Hold since Thu 10/27/2023 at 0839.Hold Reason: Transfer to a Procedural area)   2 g 200 mL/hr over 30 Minutes Intravenous On call to O.R. 10/27/23 0801 10/28/23 0559   10/27/23 0823  ceFAZolin (ANCEF) 2-4 GM/100ML-% IVPB       Note to Pharmacy: Garen Lah E: cabinet override      10/27/23 0823 10/27/23 2029        Assessment/Plan Fall off roof L2-6 rib fractures - multimodal pain control, IS, pulm toilet L superior and inferior pubic rami fractures - per orthopedic surgery. S/p perc fixation today by Dr. Jena Gauss.  Left sacral ala fracture with hemorrhage in pre-sacral space, left pelvic sidewall and anterior extraperitoneal pelvis - per orthopedic surgery, s/p perc fixation today by Dr. Jena Gauss. WBAT RLE. WB for transfers LLE Possible cystitis - UA not suggestive of UTI RUL pulmonary nodule - recommend outpatient follow up. Discussed with patient and daughter BPH - home meds HTN - home meds and monitor BP HLD CKD - mild aki. Continue IVF and trend T2DM - ok to have glipizide, can resume metformin 3/14 Anemia - hgb 8.4 from 10. Recheck pending  FEN: reg diet, cont IVF @ 100 ml/hr VTE: repeat CBC post op in process. Tentative LMWH start tomorrow ID: periop ancef   Dispo: recheck CBC. Therapies. He lives alone but has support from his daughter, son, and close neighbor  I reviewed Consultant Orth[facility] notes, last 24 h vitals and pain scores, last 48 h intake and output, last 24  h labs and trends, and last 24 h imaging results.    LOS: 1 day   Eric Form, [facility] Surgery 10/27/2023, 9:02 AM Please see Amion for pager number during day hours 7:00am-4:30pm

## 2023-10-28 ENCOUNTER — Encounter (HOSPITAL_COMMUNITY): Payer: Self-pay | Admitting: Student

## 2023-10-28 LAB — CBC
HCT: 24.3 % — ABNORMAL LOW (ref 39.0–52.0)
Hemoglobin: 8.1 g/dL — ABNORMAL LOW (ref 13.0–17.0)
MCH: 31.5 pg (ref 26.0–34.0)
MCHC: 33.3 g/dL (ref 30.0–36.0)
MCV: 94.6 fL (ref 80.0–100.0)
Platelets: 116 10*3/uL — ABNORMAL LOW (ref 150–400)
RBC: 2.57 MIL/uL — ABNORMAL LOW (ref 4.22–5.81)
RDW: 13.9 % (ref 11.5–15.5)
WBC: 10.7 10*3/uL — ABNORMAL HIGH (ref 4.0–10.5)
nRBC: 0 % (ref 0.0–0.2)

## 2023-10-28 LAB — BASIC METABOLIC PANEL
Anion gap: 8 (ref 5–15)
BUN: 29 mg/dL — ABNORMAL HIGH (ref 8–23)
CO2: 22 mmol/L (ref 22–32)
Calcium: 8 mg/dL — ABNORMAL LOW (ref 8.9–10.3)
Chloride: 105 mmol/L (ref 98–111)
Creatinine, Ser: 1.61 mg/dL — ABNORMAL HIGH (ref 0.61–1.24)
GFR, Estimated: 41 mL/min — ABNORMAL LOW (ref >60)
Glucose, Bld: 156 mg/dL — ABNORMAL HIGH (ref 70–99)
Potassium: 4.2 mmol/L (ref 3.5–5.1)
Sodium: 135 mmol/L (ref 135–145)

## 2023-10-28 LAB — VITAMIN D 25 HYDROXY (VIT D DEFICIENCY, FRACTURES): Vit D, 25-Hydroxy: 18.77 ng/mL — ABNORMAL LOW (ref 30–100)

## 2023-10-28 MED ORDER — SODIUM CHLORIDE 0.9 % IV SOLN: INTRAVENOUS | Status: DC

## 2023-10-28 MED ORDER — POLYETHYLENE GLYCOL 3350 17 G PO PACK
17.0000 g | PACK | Freq: Every day | ORAL | Status: DC
  Administered 2023-10-29: 17 g via ORAL
  Filled 2023-10-28 (×2): qty 1

## 2023-10-28 MED ORDER — MELATONIN 3 MG PO TABS: 3.0000 mg | ORAL_TABLET | Freq: Every evening | ORAL | Status: DC | PRN

## 2023-10-28 MED ORDER — DOCUSATE SODIUM 100 MG PO CAPS
100.0000 mg | ORAL_CAPSULE | Freq: Two times a day (BID) | ORAL | Status: DC
  Administered 2023-10-28 – 2023-11-01 (×4): 100 mg via ORAL
  Filled 2023-10-28 (×8): qty 1

## 2023-10-28 MED ORDER — METFORMIN HCL 500 MG PO TABS
500.0000 mg | ORAL_TABLET | Freq: Two times a day (BID) | ORAL | Status: DC
  Administered 2023-10-28 – 2023-11-01 (×9): 500 mg via ORAL
  Filled 2023-10-28 (×9): qty 1

## 2023-10-28 NOTE — Anesthesia Postprocedure Evaluation (Signed)
 Anesthesia Post Note  Patient: MATTOX SCHORR  Procedure(s) Performed: CLOSED REDUCTION, PELVIS, WITH PERCUTANEOUS FIXATION (Left: Hip)     Patient location during evaluation: PACU Anesthesia Type: General Level of consciousness: awake and alert Pain management: pain level controlled Vital Signs Assessment: post-procedure vital signs reviewed and stable Respiratory status: spontaneous breathing, nonlabored ventilation, respiratory function stable and patient connected to nasal cannula oxygen Cardiovascular status: blood pressure returned to baseline and stable Postop Assessment: no apparent nausea or vomiting Anesthetic complications: no   There were no known notable events for this encounter.  Last Vitals:  Vitals:   10/28/23 0425 10/28/23 1443  BP: (!) 129/49 (!) 118/52  Pulse: 83 83  Resp: 16 18  Temp: 36.9 C 36.9 C  SpO2: 95% 91%    Last Pain:  Vitals:   10/28/23 1000  TempSrc:   PainSc: 0-No pain   Pain Goal:                   Breana Litts

## 2023-10-28 NOTE — Progress Notes (Addendum)
 Progress Note  1 Day Post-Op  Subjective: Sitting up in bed drinking coffee. Overall feeling well with good pain control - not needed IV pain meds today. He has not worked with therapies yet. He is off of supplemental O2 without respiratory complaints. He is eating reg diet without issues. No BM since PTA. States he normally has 1-2 Bms daily and in the past melatonin has helped him have a BM  son at bedside. He inquires about possibility of audiology consultation for patients baseline hearing loss for which he recently was gifted hearing aids  Objective: Vital signs in last 24 hours: Temp:  [97.6 F (36.4 C)-98.8 F (37.1 C)] 98.4 F (36.9 C) (03/14 0425) Pulse Rate:  [68-88] 83 (03/14 0425) Resp:  [16-25] 16 (03/14 0425) BP: (111-141)/(46-68) 129/49 (03/14 0425) SpO2:  [91 %-98 %] 95 % (03/14 0425) Weight:  [73.9 kg] 73.9 kg (03/13 0842) Last BM Date : 10/25/23  Intake/Output from previous day: 03/13 0701 - 03/14 0700 In: 1303 [P.O.:240; I.V.:613; IV Piggyback:450] Out: 550 [Urine:500; Blood:50] Intake/Output this shift: No intake/output data recorded.  PE: General: pleasant, WD, male who is laying in bed in NAD Lungs: CTAB, no wheezes, rhonchi, or rales noted.  Respiratory effort nonlabored on room air Abd: soft, NT, ND MSK: all 4 extremities are symmetrical with no cyanosis, clubbing, or edema. Skin: warm and dry Psych: A&Ox3 with an appropriate affect.    Lab Results:  Recent Labs    10/27/23 1545 10/28/23 0407  WBC 11.3* 10.7*  HGB 8.4* 8.1*  HCT 24.8* 24.3*  PLT 118* 116*   BMET Recent Labs    10/27/23 0546 10/27/23 1545 10/28/23 0407  NA 136  --  135  K 4.2  --  4.2  CL 107  --  105  CO2 24  --  22  GLUCOSE 167*  --  156*  BUN 36*  --  29*  CREATININE 1.93* 1.87* 1.61*  CALCIUM 7.8*  --  8.0*   PT/INR No results for input(s): "LABPROT", "INR" in the last 72 hours. CMP     Component Value Date/Time   NA 135 10/28/2023 0407   K 4.2  10/28/2023 0407   CL 105 10/28/2023 0407   CO2 22 10/28/2023 0407   GLUCOSE 156 (H) 10/28/2023 0407   BUN 29 (H) 10/28/2023 0407   CREATININE 1.61 (H) 10/28/2023 0407   CALCIUM 8.0 (L) 10/28/2023 0407   PROT 7.4 04/29/2023 1014   ALBUMIN 4.1 04/29/2023 1014   AST 20 04/29/2023 1014   ALT 18 04/29/2023 1014   ALKPHOS 56 04/29/2023 1014   BILITOT 0.6 04/29/2023 1014   GFRNONAA 41 (L) 10/28/2023 0407   GFRAA >60 04/03/2020 1433   Lipase  No results found for: "LIPASE"     Studies/Results: DG Pelvis Comp Min 3V Result Date: 10/27/2023 CLINICAL DATA:  Postop pelvic fracture fixation EXAM: JUDET PELVIS - 3+ VIEW COMPARISON:  10/26/2023, CT 10/25/2023 FINDINGS: Left hip replacement with normal alignment. Previous hernia repair. Interval single screw fixation across the sacrum and SI joints and placement of single fixating screw across the left superior pubic ramus. Comminuted superior and inferior pubic rami fractures on the left with mild residual displacement. Pubic symphysis appears intact. Moderate right hip degenerative changes. IMPRESSION: Interval screw fixation across the sacrum and SI joints and left superior pubic ramus. Comminuted left superior and inferior pubic rami fractures with mild residual displacement. Electronically Signed   By: Jasmine Pang M.D.   On: 10/27/2023  15:57   DG HIP UNILAT WITH PELVIS 2-3 VIEWS LEFT Result Date: 10/27/2023 CLINICAL DATA:  Elective surgery. EXAM: DG HIP (WITH OR WITHOUT PELVIS) 2-3V LEFT COMPARISON:  Pelvic radiograph yesterday FINDINGS: Thirteen fluoroscopic spot views of the pelvis and left hip submitted from the operating room. Screw traverses the left superior pubic ramus fracture. Left inferior ramus fracture is unchanged. Screw traverses both sacroiliac joints. Fluoroscopy time 1 minutes 33 seconds. Dose 26.84 mGy. IMPRESSION: Intraoperative fluoroscopy during left superior pubic ramus fracture and sacroiliac fixation. Electronically Signed    By: Narda Rutherford M.D.   On: 10/27/2023 11:25   DG C-Arm 1-60 Min-No Report Result Date: 10/27/2023 Fluoroscopy was utilized by the requesting physician.  No radiographic interpretation.   DG Pelvis 1-2 Views Result Date: 10/26/2023 CLINICAL DATA:  Pelvic fracture. EXAM: PELVIS - 1-2 VIEW COMPARISON:  CT yesterday FINDINGS: Imaging obtained supine and right side down per request. Displaced fractures of left superior and inferior pubic rami, mildly comminuted. Fractures are not well delineated on the cross-table views. No visualized left sacral fracture. Left hip arthroplasty is intact were visualized IMPRESSION: Displaced left superior and inferior pubic rami fractures Electronically Signed   By: Narda Rutherford M.D.   On: 10/26/2023 21:57    Anti-infectives: Anti-infectives (From admission, onward)    Start     Dose/Rate Route Frequency Ordered Stop   10/27/23 1800  ceFAZolin (ANCEF) IVPB 2g/100 mL premix        2 g 200 mL/hr over 30 Minutes Intravenous Every 12 hours 10/27/23 1218 10/28/23 0657   10/27/23 0900  ceFAZolin (ANCEF) IVPB 2g/100 mL premix        2 g 200 mL/hr over 30 Minutes Intravenous On call to O.R. 10/27/23 0801 10/27/23 1015   10/27/23 0823  ceFAZolin (ANCEF) 2-4 GM/100ML-% IVPB       Note to Pharmacy: Garen Lah E: cabinet override      10/27/23 0823 10/27/23 0951        Assessment/Plan Fall off roof L2-6 rib fractures - multimodal pain control, IS, pulm toilet L superior and inferior pubic rami fractures - per orthopedic surgery. S/p perc fixation today by Dr. Jena Gauss.  Left sacral ala fracture with hemorrhage in pre-sacral space, left pelvic sidewall and anterior extraperitoneal pelvis - per orthopedic surgery, s/p perc fixation today by Dr. Jena Gauss. WBAT RLE. WB for transfers LLE Possible cystitis - UA not suggestive of UTI RUL pulmonary nodule - recommend outpatient follow up. Discussed with patient and daughter BPH - home meds HTN - home meds and  monitor BP HLD CKD - mild aki. Cr improved to 1.61 today (1.87). baseline around 1.3. continue IVF today T2DM - ok to have glipizide, resume metformin Anemia - hgb 8.1 from 8.4. overall stable. No significant hypotension. No tachycardia. Recheck am labs Hearing loss - recommend outpatient audiology follow up  FEN: HH diet, cont IVF @ 100 ml/hr, bowel regimen VTE: LMWH ID: periop ancef   Dispo: recheck CBC. Therapies. He lives alone but has support from his daughter, son, and close neighbor  I reviewed Consultant Ortho notes, last 24 h vitals and pain scores, last 48 h intake and output, last 24 h labs and trends, and last 24 h imaging results.    LOS: 2 days   Eric Form, [facility] 10/28/2023, 8:35 AM Please see Amion for pager number during day hours 7:00am-4:30pm

## 2023-10-28 NOTE — TOC Initial Note (Signed)
 Transition of Care [facility]) - Initial/Assessment Note    Patient Details  Name: John Sanford MRN: [id number] Date of Birth: May 31, 1933  Transition of [facility]) CM/SW Contact:    Glennon Mac, RN Phone Number: 10/28/2023, 5:14 PM  Clinical Narrative:                 Pt is an 89 y.o. male admitted into ED on 10/25/23 for fall off roof. CT & x-rays showed L sacral & pubic rami fx, L 2-6 rib fx. Closed reduction of pelvis performed 3/13. RLE WBAT, LLE WB for transfers only.  PTA, pt independent and living at home with adult children.  PT/OT recommending SNF at dc for rehab, and patient /family agreeable to plan.  They prefer [place].  TOC will initiate FL2 and fax out for SNF bed search.  Will provide bed offers when available.   Expected Discharge Plan: Skilled Nursing Facility Barriers to Discharge: Continued Medical Work up            Expected Discharge Plan and Services   Discharge Planning Services: CM Consult   Living arrangements for the past 2 months: Single Family Home                                      Prior Living Arrangements/Services Living arrangements for the past 2 months: Single Family Home Lives with:: Adult Children Patient language and need for interpreter reviewed:: Yes Do you feel safe going back to the place where you live?: No      Need for Family Participation in Patient Care: Yes (Comment) Care giver support system in place?: Yes (comment)   Criminal Activity/Legal Involvement Pertinent to Current Situation/Hospitalization: No - Comment as needed  Activities of Daily Living   ADL Screening (condition at time of admission) Independently performs ADLs?: Yes (appropriate for developmental age) Is the patient deaf or have difficulty hearing?: No Does the patient have difficulty seeing, even when wearing glasses/contacts?: No Does the patient have difficulty concentrating, remembering, or making decisions?:  No  Permission Sought/Granted                  Emotional Assessment Appearance:: Appears stated age Attitude/Demeanor/Rapport: Engaged Affect (typically observed): Accepting Orientation: : Oriented to Self, Oriented to Place, Oriented to  Time, Oriented to Situation      Admission diagnosis:  Hyperkalemia [E87.5] Fall [W19.XXXA] AKI (acute kidney injury) (HCC) [N17.9] Closed fracture of multiple ribs of left side, initial encounter [S22.42XA] Closed nondisplaced fracture of pelvis, unspecified part of pelvis, initial encounter (HCC) [S32.9XXA] Patient Active Problem List   Diagnosis Date Noted   Fall 10/26/2023   Hearing loss 07/05/2023   Vitamin B12 deficiency 01/28/2023   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 01/14/2023   Lymphocytosis 01/05/2023   Abdominal bruit 01/05/2023   Vitamin D deficiency 01/05/2023   Secondary hyperparathyroidism of renal origin (HCC) 01/05/2023   Medicare annual wellness visit, subsequent 01/07/2022   PAD (peripheral artery disease) (HCC) 01/07/2022   CKD stage 3 due to type 2 diabetes mellitus (HCC) 01/07/2022   Status post hip replacement, left 01/02/2021   Osteoarthritis of left hip 10/23/2019   Fatigue 05/17/2018   Sleep disturbance 03/21/2015   Actinic keratoses 09/20/2014   Advanced directives, counseling/discussion 09/20/2014   Health maintenance examination 08/30/2012   BPH without obstruction/lower urinary tract symptoms 01/30/2010   Allergic rhinitis 04/30/2008  COLONIC POLYPS, BENIGN 09/04/2007   Constipation 07/26/2007   Type 2 diabetes mellitus with neurological manifestations, controlled (HCC) 10/20/2006   Hyperlipidemia associated with type 2 diabetes mellitus (HCC) 10/20/2006   Essential hypertension, benign 10/20/2006   PCP:  Eustaquio Boyden, MD Pharmacy:   [id number] - [address] [place] - [address] [place] [address] Phone: [phone] Fax:  [phone]     Social Drivers of Health (SDOH) Social History: SDOH Screenings   Food Insecurity: No Food Insecurity (10/26/2023)  Housing: Low Risk  (10/26/2023)  Transportation Needs: No Transportation Needs (10/26/2023)  Utilities: At Risk (10/26/2023)  Alcohol Screen: Low Risk  (01/04/2023)  Depression (PHQ2-9): Low Risk  (01/14/2023)  Financial Resource Strain: Low Risk  (01/04/2023)  Physical Activity: Inactive (01/04/2023)  Social Connections: Moderately Isolated (10/26/2023)  Stress: No Stress Concern Present (01/04/2023)  Tobacco Use: Medium Risk (10/27/2023)   SDOH Interventions:     Readmission Risk Interventions     No data to display         Quintella Baton, RN, BSN  Trauma/Neuro ICU Case Manager [phone]

## 2023-10-28 NOTE — Evaluation (Signed)
 Physical Therapy Evaluation Patient Details Name: John Sanford MRN: [id number] DOB: 03-16-1933 Today's Date: 10/28/2023  History of Present Illness  Pt is an 89 y.o. male admitted into ED on 10/25/23 for fall off roof. CT & x-rays showed L sacral & pubic rami fx, L 2-6 rib fx. Closed reduction of pelvis performed 3/13. RLE WBAT, LLE WB for transfers only. PMH: arthrisits, skin cancer, DM, HTN, pneumonia, L THA  Clinical Impression  Patient presents with decreased mobility due to pain, decreased balance, decreased strength, decreased activity tolerance and limited weight allowance L LE for transfers only.          If plan is discharge home, recommend the following: Help with stairs or ramp for entrance;Assist for transportation;A lot of help with bathing/dressing/bathroom;A lot of help with walking and/or transfers;Assistance with cooking/housework   Can travel by private vehicle   No    Equipment Recommendations Other (comment) (TBA at next venue)  Recommendations for Other Services       Functional Status Assessment Patient has had a recent decline in their functional status and demonstrates the ability to make significant improvements in function in a reasonable and predictable amount of time.     Precautions / Restrictions Precautions Precautions: Fall Recall of Precautions/Restrictions: Intact Restrictions RLE Weight Bearing Per Provider Order: Weight bearing as tolerated LLE Weight Bearing Per Provider Order: Weight bearing as tolerated Other Position/Activity Restrictions: LLE WB for transfers only      Mobility  Bed Mobility Overal bed mobility: Needs Assistance Bed Mobility: Supine to Sit     Supine to sit: Mod assist, HOB elevated, Used rails     General bed mobility comments: Assist to mobilize LLE, pt pulling up to lift trunk and assist to scoot L hip    Transfers Overall transfer level: Needs assistance Equipment used: Rolling walker (2  wheels) Transfers: Sit to/from Stand, Bed to chair/wheelchair/BSC Sit to Stand: Mod assist, From elevated surface   Step pivot transfers: Contact guard assist       General transfer comment: some lifing help to stand from EOB and BSC, then stepping bed to chair with RW and CGA for balance/safety; pt limiting L LE weight bearing due to pain    Ambulation/Gait               General Gait Details: NT, orders for transfers only  Stairs            Wheelchair Mobility     Tilt Bed    Modified Rankin (Stroke Patients Only)       Balance Overall balance assessment: Needs assistance   Sitting balance-Leahy Scale: Good     Standing balance support: Bilateral upper extremity supported Standing balance-Leahy Scale: Poor Standing balance comment: UE support for pain and balance                             Pertinent Vitals/Pain Pain Assessment Pain Assessment: Faces Faces Pain Scale: Hurts little more Pain Location: soreness L ribs and groin Pain Descriptors / Indicators: Sore Pain Intervention(s): Monitored during session    Home Living Family/patient expects to be discharged to:: Private residence Living Arrangements: Children Available Help at Discharge: Family;Friend(s) Type of Home: House Home Access: Stairs to enter   Secretary/administrator of Steps: 5 Alternate Level Stairs-Number of Steps: Flight Home Layout: Multi-level;Able to live on main level with bedroom/bathroom Home Equipment: Grab bars - tub/shower;Rolling Walker (2 wheels);Grab bars - toilet  Additional Comments: Split level house    Prior Function Prior Level of Function : Independent/Modified Independent                     Extremity/Trunk Assessment   Upper Extremity Assessment Upper Extremity Assessment: Defer to OT evaluation    Lower Extremity Assessment Lower Extremity Assessment: RLE deficits/detail;LLE deficits/detail RLE Deficits / Details: AROM WFL,  strength hip flexion 3/5, knee extension 4/5 ankle DF 4/5 LLE Deficits / Details: AAROM WFL, strength hip flexion 2+/5, knee extension 4-/5       Communication   Communication Communication: No apparent difficulties Factors Affecting Communication: Hearing impaired (put in hearing aides)    Cognition Arousal: Alert Behavior During Therapy: WFL for tasks assessed/performed   PT - Cognitive impairments: No apparent impairments                         Following commands: Intact       Cueing       General Comments General comments (skin integrity, edema, etc.): son in the room and supportive, interested in rehab prior to d/c    Exercises Total Joint Exercises Ankle Circles/Pumps: AROM, Both, 5 reps, Supine   Assessment/Plan    PT Assessment Patient needs continued PT services  PT Problem List Decreased strength;Decreased mobility;Decreased activity tolerance;Decreased balance;Pain;Decreased knowledge of use of DME       PT Treatment Interventions DME instruction;Therapeutic exercise;Balance training;Wheelchair mobility training;Functional mobility training;Therapeutic activities;Patient/family education    PT Goals (Current goals can be found in the Care Plan section)  Acute Rehab PT Goals Patient Stated Goal: to go to rehab PT Goal Formulation: With patient/family Time For Goal Achievement: 11/11/23 Potential to Achieve Goals: Good    Frequency Min 3X/week     Co-evaluation PT/OT/SLP Co-Evaluation/Treatment: Yes Reason for Co-Treatment: Complexity of the patient's impairments (multi-system involvement);For patient/therapist safety;To address functional/ADL transfers PT goals addressed during session: Mobility/safety with mobility;Balance         AM-PAC PT "6 Clicks" Mobility  Outcome Measure Help needed turning from your back to your side while in a flat bed without using bedrails?: A Little Help needed moving from lying on your back to sitting on  the side of a flat bed without using bedrails?: A Lot Help needed moving to and from a bed to a chair (including a wheelchair)?: A Little Help needed standing up from a chair using your arms (e.g., wheelchair or bedside chair)?: A Lot Help needed to walk in hospital room?: Total Help needed climbing 3-5 steps with a railing? : Total 6 Click Score: 12    End of Session Equipment Utilized During Treatment: Gait belt Activity Tolerance: Patient tolerated treatment well Patient left: in chair;with call bell/phone within reach;with chair alarm set;with family/visitor present Nurse Communication: Mobility status PT Visit Diagnosis: Difficulty in walking, not elsewhere classified (R26.2);Pain Pain - Right/Left: Left Pain - part of body: Hip    Time: 1000-1040 PT Time Calculation (min) (ACUTE ONLY): 40 min   Charges:   PT Evaluation $PT Eval Moderate Complexity: 1 Mod PT Treatments $Therapeutic Activity: 8-22 mins PT General Charges $$ ACUTE PT VISIT: 1 Visit         Sheran Lawless, PT Acute Rehabilitation Services Office:[phone] 10/28/2023   Elray Mcgregor 10/28/2023, 2:04 PM

## 2023-10-28 NOTE — Evaluation (Signed)
 Occupational Therapy Evaluation Patient Details Name: John Sanford MRN: [id number] DOB: 1933/05/23 Today's Date: 10/28/2023   History of Present Illness   Pt is an 89 y.o. male admitted into ED on 10/25/23 for fall off roof. CT & x-rays showed L sacral & pubic rami fx, L 2-6 rib fx. Closed reduction of pelvis performed 3/13. RLE WBAT, LLE WB for transfers only. PMH: arthrisits, skin cancer, DM, HTN, pneumonia, L THA     Clinical Impressions Pt admitted based on above, and was seen based on problem list below. PTA pt was independent with ADLs and IADLs. Today pt is requiring set up to mod assist for ADLs. Bed mobility is mod assist, and functional transfers are CGA.  Recommendation of <3 hours of skilled rehab daily to return to PLOF. OT will continue to follow acutely to maximize functional independence     If plan is discharge home, recommend the following:   A little help with walking and/or transfers;A little help with bathing/dressing/bathroom;Assistance with cooking/housework;Help with stairs or ramp for entrance     Functional Status Assessment   Patient has had a recent decline in their functional status and demonstrates the ability to make significant improvements in function in a reasonable and predictable amount of time.     Equipment Recommendations    (Defer to next venue)     Recommendations for Other Services         Precautions/Restrictions   Precautions Precautions: Fall Recall of Precautions/Restrictions: Intact Restrictions Weight Bearing Restrictions Per Provider Order: Yes RLE Weight Bearing Per Provider Order: Weight bearing as tolerated LLE Weight Bearing Per Provider Order: Non weight bearing Other Position/Activity Restrictions: LLE WB for transfers only     Mobility Bed Mobility Overal bed mobility: Needs Assistance Bed Mobility: Supine to Sit     Supine to sit: Mod assist, HOB elevated, Used rails     General bed mobility  comments: Assist to mobilize LLE    Transfers Overall transfer level: Needs assistance Equipment used: Rolling walker (2 wheels) Transfers: Bed to chair/wheelchair/BSC       Step pivot transfers: Contact guard assist, From elevated surface            Balance Overall balance assessment: Needs assistance Sitting-balance support: Bilateral upper extremity supported, Feet supported Sitting balance-Leahy Scale: Fair     Standing balance support: Bilateral upper extremity supported, During functional activity, Reliant on assistive device for balance Standing balance-Leahy Scale: Poor Standing balance comment: Reliant on RW                           ADL either performed or assessed with clinical judgement   ADL Overall ADL's : Needs assistance/impaired Eating/Feeding: Set up;Sitting   Grooming: Set up;Sitting           Upper Body Dressing : Set up;Sitting   Lower Body Dressing: Moderate assistance;Sit to/from stand Lower Body Dressing Details (indicate cue type and reason): Unable to reach lower legs Toilet Transfer: Contact guard assist;Cueing for sequencing;Cueing for safety;Stand-pivot;BSC/3in1;Rolling walker (2 wheels) Toilet Transfer Details (indicate cue type and reason): Cues for sequencing and safety Toileting- Clothing Manipulation and Hygiene: Minimal assistance;Sit to/from stand Toileting - Clothing Manipulation Details (indicate cue type and reason): Min assist to maintain balance during wiping     Functional mobility during ADLs: Contact guard assist;Rolling walker (2 wheels)       Vision Baseline Vision/History: 0 No visual deficits       Perception  Praxis         Pertinent Vitals/Pain Pain Assessment Pain Assessment: No/denies pain     Extremity/Trunk Assessment Upper Extremity Assessment Upper Extremity Assessment: Overall WFL for tasks assessed;Right hand dominant   Lower Extremity Assessment Lower Extremity  Assessment: Defer to PT evaluation   Cervical / Trunk Assessment Cervical / Trunk Assessment: Normal   Communication Communication Communication: No apparent difficulties Factors Affecting Communication: Hearing impaired   Cognition Arousal: Alert Behavior During Therapy: WFL for tasks assessed/performed Cognition: No apparent impairments                               Following commands: Intact       Cueing  General Comments   Cueing Techniques: Verbal cues;Tactile cues;Visual cues      Exercises     Shoulder Instructions      Home Living Family/patient expects to be discharged to:: Private residence Living Arrangements: Children Available Help at Discharge: Family;Friend(s) Type of Home: House Home Access: Stairs to enter [employer] of Steps: 5   Home Layout: Multi-level;Able to live on main level with bedroom/bathroom Alternate Level Stairs-Number of Steps: Flight   Bathroom Shower/Tub: Chief Strategy Officer: Handicapped height Bathroom Accessibility: Yes How Accessible: Accessible via walker Home Equipment: Grab bars - tub/shower;Rolling Walker (2 wheels);Grab bars - toilet   Additional Comments: Split level house      Prior Functioning/Environment Prior Level of Function : Independent/Modified Independent                    OT Problem List: Decreased strength;Decreased range of motion;Decreased activity tolerance;Impaired balance (sitting and/or standing)   OT Treatment/Interventions: Self-care/ADL training;Therapeutic exercise;Neuromuscular education;DME and/or AE instruction;Therapeutic activities;Patient/family education;Balance training      OT Goals(Current goals can be found in the care plan section)   Acute Rehab OT Goals Patient Stated Goal: To move better OT Goal Formulation: With patient Time For Goal Achievement: 11/11/23 Potential to Achieve Goals: Good   OT Frequency:  Min 2X/week     Co-evaluation PT/OT/SLP Co-Evaluation/Treatment: Yes Reason for Co-Treatment: Complexity of the patient's impairments (multi-system involvement);For patient/therapist safety;To address functional/ADL transfers   OT goals addressed during session: ADL's and self-care      AM-PAC OT "6 Clicks" Daily Activity     Outcome Measure Help from another person eating meals?: None Help from another person taking care of personal grooming?: A Little Help from another person toileting, which includes using toliet, bedpan, or urinal?: A Little Help from another person bathing (including washing, rinsing, drying)?: A Lot Help from another person to put on and taking off regular upper body clothing?: A Little Help from another person to put on and taking off regular lower body clothing?: A Lot 6 Click Score: 17   End of Session Equipment Utilized During Treatment: Gait belt;Rolling walker (2 wheels) Nurse Communication: Mobility status  Activity Tolerance: Patient tolerated treatment well Patient left: in chair;with call bell/phone within reach;with chair alarm set;with nursing/sitter in room  OT Visit Diagnosis: Unsteadiness on feet (R26.81);Other abnormalities of gait and mobility (R26.89);Repeated falls (R29.6)                Time: 1000-1042 OT Time Calculation (min): 42 min Charges:  OT General Charges $OT Visit: 1 Visit OT Evaluation $OT Eval Moderate Complexity: 1 Mod  Ivor Messier, OT  Acute Rehabilitation Services Office [phone] Secure chat preferred   Marilynne Drivers 10/28/2023,  11:20 AM

## 2023-10-28 NOTE — Progress Notes (Signed)
  Progress Note   Date: 10/28/2023  Patient Name: John Sanford        MRN#: [id number]  Review the patient's clinical findings supports the diagnosis of:   Acute blood loss anemia

## 2023-10-28 NOTE — Progress Notes (Signed)
 Orthopaedic Trauma Progress Note  SUBJECTIVE: Doing fairly well up today, notes significant improvement in pain from preoperatively.  Denies any significant numbness or tingling throughout bilateral lower extremities.  No chest pain. No SOB. No nausea/vomiting. No other complaints.  Patient's son at bedside  OBJECTIVE:  Vitals:   10/28/23 0017 10/28/23 0425  BP: (!) 111/46 (!) 129/49  Pulse: 80 83  Resp: 17 16  Temp: 98.3 F (36.8 C) 98.4 F (36.9 C)  SpO2: 95% 95%    General: Sitting up in bed, no acute distress.  Pleasant and cooperative Respiratory: No increased work of breathing.  Left lower extremity/pelvis: Dressings clean, dry, intact.  No significant tenderness with palpation over the hip or throughout the thighs.  Ankle dorsiflexion/plantarflexion intact bilaterally.  Endorses sensation of light touch throughout extremities.  2+ DP pulse   IMAGING: Stable post op imaging.   LABS:  Results for orders placed or performed during the hospital encounter of 10/25/23 (from the past 24 hours)  Glucose, capillary     Status: Abnormal   Collection Time: 10/27/23 10:53 AM  Result Value Ref Range   Glucose-Capillary 156 (H) 70 - 99 mg/dL  CBC     Status: Abnormal   Collection Time: 10/27/23  3:45 PM  Result Value Ref Range   WBC 11.3 (H) 4.0 - 10.5 K/uL   RBC 2.59 (L) 4.22 - 5.81 MIL/uL   Hemoglobin 8.4 (L) 13.0 - 17.0 g/dL   HCT 86.5 (L) 78.4 - 69.6 %   MCV 95.8 80.0 - 100.0 fL   MCH 32.4 26.0 - 34.0 pg   MCHC 33.9 30.0 - 36.0 g/dL   RDW 29.5 28.4 - 13.2 %   Platelets 118 (L) 150 - 400 K/uL   nRBC 0.0 0.0 - 0.2 %  Creatinine, serum     Status: Abnormal   Collection Time: 10/27/23  3:45 PM  Result Value Ref Range   Creatinine, Ser 1.87 (H) 0.61 - 1.24 mg/dL   GFR, Estimated 34 (L) >60 mL/min  Basic metabolic panel     Status: Abnormal   Collection Time: 10/28/23  4:07 AM  Result Value Ref Range   Sodium 135 135 - 145 mmol/L   Potassium 4.2 3.5 - 5.1 mmol/L   Chloride  105 98 - 111 mmol/L   CO2 22 22 - 32 mmol/L   Glucose, Bld 156 (H) 70 - 99 mg/dL   BUN 29 (H) 8 - 23 mg/dL   Creatinine, Ser 4.40 (H) 0.61 - 1.24 mg/dL   Calcium 8.0 (L) 8.9 - 10.3 mg/dL   GFR, Estimated 41 (L) >60 mL/min   Anion gap 8 5 - 15  CBC     Status: Abnormal   Collection Time: 10/28/23  4:07 AM  Result Value Ref Range   WBC 10.7 (H) 4.0 - 10.5 K/uL   RBC 2.57 (L) 4.22 - 5.81 MIL/uL   Hemoglobin 8.1 (L) 13.0 - 17.0 g/dL   HCT 10.2 (L) 72.5 - 36.6 %   MCV 94.6 80.0 - 100.0 fL   MCH 31.5 26.0 - 34.0 pg   MCHC 33.3 30.0 - 36.0 g/dL   RDW 44.0 34.7 - 42.5 %   Platelets 116 (L) 150 - 400 K/uL   nRBC 0.0 0.0 - 0.2 %    ASSESSMENT: John Sanford is a 89 y.o. male, 1 Day Post-Op s/p PERCUTANEOUS FIXATION LEFT LC PELVIC RING INJURY  CV/Blood loss: Acute blood loss anemia, Hgb 8.1 this morning. Hemodynamically stable  PLAN:  Weightbearing: WBAT RLE.  WB LLE for transfers only ROM: Unrestricted ROM Incisional and dressing care: Reinforce dressings as needed  Showering: Okay to begin showering getting incisions wet 10/30/2023 Orthopedic device(s): None  Pain management:  1. Tylenol 1000 mg q 8 hours scheduled 2. Robaxin 500 mg q 6 hours PRN 3. Oxycodone 2.5-5 mg q 4 hours PRN 4. Morphine 2 mg q 3 hours PRN 5. Lidoderm 5% patch q 24 hours VTE prophylaxis: Lovenox, SCDs ID:  Ancef 2gm post op Foley/Lines:  No foley, KVO IVFs Impediments to Fracture Healing: Vitamin D level 25 when checked 12/2022.  Will recheck now Dispo: PT/OT evaluation today, dispo pending.  Okay for discharge from ortho standpoint once cleared by medicine team and therapies  D/C recommendations: -Oxycodone 2.5 to 5 mg for pain control -Aspirin 325 mg daily x 30 days for DVT prophylaxis -Possible need for Vit D supplementation  Follow - up plan: 2 weeks after discharge for wound check and repeat x-rays   Contact information:  Truitt Merle MD, Thyra Breed PA-C. After hours and holidays please  check [email or web address] for group call information for [facility]   Thompson Caul, PA-C [phone] (office) [email or web address]

## 2023-10-28 NOTE — Care Management Important Message (Signed)
 Important Message  Patient Details  Name: REYAANSH MERLO MRN: [id number] Date of Birth: 1932-11-20   Important Message Given:  Yes - Medicare IM     Sherilyn Banker 10/28/2023, 3:13 PM

## 2023-10-29 LAB — CBC
HCT: 20.9 % — ABNORMAL LOW (ref 39.0–52.0)
Hemoglobin: 7.2 g/dL — ABNORMAL LOW (ref 13.0–17.0)
MCH: 32.1 pg (ref 26.0–34.0)
MCHC: 34.4 g/dL (ref 30.0–36.0)
MCV: 93.3 fL (ref 80.0–100.0)
Platelets: 132 10*3/uL — ABNORMAL LOW (ref 150–400)
RBC: 2.24 MIL/uL — ABNORMAL LOW (ref 4.22–5.81)
RDW: 13.7 % (ref 11.5–15.5)
WBC: 8.7 10*3/uL (ref 4.0–10.5)
nRBC: 0 % (ref 0.0–0.2)

## 2023-10-29 LAB — BASIC METABOLIC PANEL
Anion gap: 7 (ref 5–15)
BUN: 24 mg/dL — ABNORMAL HIGH (ref 8–23)
CO2: 23 mmol/L (ref 22–32)
Calcium: 7.8 mg/dL — ABNORMAL LOW (ref 8.9–10.3)
Chloride: 106 mmol/L (ref 98–111)
Creatinine, Ser: 1.44 mg/dL — ABNORMAL HIGH (ref 0.61–1.24)
GFR, Estimated: 47 mL/min — ABNORMAL LOW (ref >60)
Glucose, Bld: 165 mg/dL — ABNORMAL HIGH (ref 70–99)
Potassium: 4.3 mmol/L (ref 3.5–5.1)
Sodium: 136 mmol/L (ref 135–145)

## 2023-10-29 MED ORDER — FERROUS SULFATE 325 (65 FE) MG PO TABS
325.0000 mg | ORAL_TABLET | Freq: Two times a day (BID) | ORAL | Status: DC
  Administered 2023-10-29 – 2023-11-01 (×7): 325 mg via ORAL
  Filled 2023-10-29 (×7): qty 1

## 2023-10-29 MED ORDER — VITAMIN C 500 MG PO TABS
500.0000 mg | ORAL_TABLET | Freq: Two times a day (BID) | ORAL | Status: DC
  Administered 2023-10-29 – 2023-11-01 (×7): 500 mg via ORAL
  Filled 2023-10-29 (×7): qty 1

## 2023-10-29 MED ORDER — POLYETHYLENE GLYCOL 3350 17 G PO PACK
17.0000 g | PACK | Freq: Two times a day (BID) | ORAL | Status: DC
  Administered 2023-10-30 – 2023-11-01 (×2): 17 g via ORAL
  Filled 2023-10-29 (×6): qty 1

## 2023-10-29 MED ORDER — VITAMIN D (ERGOCALCIFEROL) 1.25 MG (50000 UNIT) PO CAPS
50000.0000 [IU] | ORAL_CAPSULE | ORAL | Status: DC
  Administered 2023-10-29: 50000 [IU] via ORAL
  Filled 2023-10-29 (×2): qty 1

## 2023-10-29 NOTE — [place] FL2 (Signed)
 [place] MEDICAID FL2 LEVEL OF CARE FORM     IDENTIFICATION  Patient Name: John Sanford Birthdate: Sep 15, 1932 Sex: male Admission Date (Current Location): 10/25/2023  [facility] and [place] Number:  Producer, television/film/video and Address:  [facility], 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 01/14/2023   Lymphocytosis 01/05/2023   Abdominal bruit 01/05/2023   Vitamin D deficiency 01/05/2023   Secondary hyperparathyroidism of renal origin (HCC) 01/05/2023   Medicare annual wellness visit, subsequent 01/07/2022   PAD (peripheral artery disease) (HCC) 01/07/2022   CKD stage 3 due to type 2 diabetes mellitus (HCC) 01/07/2022   Status post hip replacement, left 01/02/2021   Osteoarthritis of left hip 10/23/2019   Fatigue 05/17/2018   Sleep disturbance 03/21/2015   Actinic keratoses 09/20/2014   Advanced directives, counseling/discussion 09/20/2014   Health maintenance examination 08/30/2012   BPH without obstruction/lower urinary tract symptoms 01/30/2010   Allergic rhinitis 04/30/2008   COLONIC POLYPS, BENIGN 09/04/2007   Constipation 07/26/2007   Type 2 diabetes mellitus with neurological manifestations, controlled (HCC) 10/20/2006   Hyperlipidemia associated with type 2 diabetes mellitus (HCC) 10/20/2006   Essential hypertension, benign 10/20/2006    Orientation RESPIRATION BLADDER Height  & Weight     Self, Time, Situation, Place  Normal Continent Weight: 163 lb (73.9 kg) Height:  5\' 6"  (167.6 cm)  BEHAVIORAL SYMPTOMS/MOOD NEUROLOGICAL BOWEL NUTRITION STATUS      Continent Diet (See dc summary)  AMBULATORY STATUS COMMUNICATION OF NEEDS Skin   Limited Assist Verbally PU Stage and Appropriate Care (Incision (Closed) 10/27/23 Hip Left)                       Personal Care Assistance Level of Assistance  Bathing, Feeding, Dressing, Total care Bathing Assistance: Limited assistance Feeding assistance: Limited assistance Dressing Assistance: Limited assistance Total Care Assistance: Limited assistance   Functional Limitations Info  Sight, Hearing, Speech Sight Info: Adequate Hearing Info: Adequate Speech Info: Adequate    SPECIAL CARE FACTORS FREQUENCY  PT (By licensed PT), OT (By licensed OT)     PT Frequency: 5x weekly OT Frequency: 5x weekly            Contractures      Additional Factors Info  Code Status, Allergies Code Status Info: FULL Allergies Info: No known allergies           Current Medications (10/29/2023):  This is the current hospital active medication list Current Facility-Administered Medications  Medication Dose Route Frequency Provider Last Rate Last Admin   acetaminophen (TYLENOL) tablet 1,000 mg  1,000 mg Oral Q8H McClung, Sarah A, PA-C   1,000 mg at 10/29/23 0272   amLODipine (NORVASC) tablet 5 mg  5 mg Oral Daily West Bali, PA-C   5 mg at 10/29/23 0900   ascorbic acid (VITAMIN C) tablet 500 mg  500 mg Oral BID Trixie Deis  R, PA-C       aspirin EC tablet 81 mg  81 mg Oral Daily West Bali, PA-C   81 mg at 10/29/23 0900   diphenhydrAMINE (BENADRYL) 12.5 MG/5ML elixir 12.5-25 mg  12.5-25 mg Oral Q4H PRN West Bali, PA-C       docusate sodium (COLACE) capsule 100 mg  100 mg Oral BID Eric Form, PA-C   100 mg at 10/29/23 0900   enoxaparin (LOVENOX) injection 30 mg  30 mg Subcutaneous Q24H West Bali, PA-C   30 mg at 10/29/23 6962   ferrous sulfate tablet 325 mg  325 mg Oral BID WC Trixie Deis R, PA-C       glipiZIDE (GLUCOTROL XL) 24 hr tablet 5 mg  5 mg Oral Q breakfast West Bali, PA-C   5 mg at 10/29/23 0900   lidocaine (LIDODERM) 5 % 1 patch  1 patch Transdermal Q24H West Bali, PA-C   1 patch at 10/27/23 1220   losartan (COZAAR) tablet 50 mg  50 mg Oral Daily West Bali, PA-C   50 mg at 10/29/23 0900   melatonin tablet 3 mg  3 mg Oral QHS PRN Eric Form, PA-C       metFORMIN (GLUCOPHAGE) tablet 500 mg  500 mg Oral BID WC Eric Form, PA-C   500 mg at 10/29/23 0900   methocarbamol (ROBAXIN) tablet 500 mg  500 mg Oral TID West Bali, PA-C   500 mg at 10/29/23 0859   metoCLOPramide (REGLAN) tablet 5-10 mg  5-10 mg Oral Q8H PRN West Bali, PA-C       Or   metoCLOPramide (REGLAN) injection 5-10 mg  5-10 mg Intravenous Q8H PRN West Bali, PA-C       morphine (PF) 2 MG/ML injection 2 mg  2 mg Intravenous Q3H PRN West Bali, PA-C   2 mg at 10/26/23 1606   ondansetron (ZOFRAN) tablet 4 mg  4 mg Oral Q6H PRN West Bali, PA-C       Or   ondansetron (ZOFRAN) injection 4 mg  4 mg Intravenous Q6H PRN Sharon Seller, Sarah A, PA-C       oxyCODONE (Oxy IR/ROXICODONE) immediate release tablet 2.5-5 mg  2.5-5 mg Oral Q4H PRN West Bali, PA-C   5 mg at 10/27/23 0443   polyethylene glycol (MIRALAX / GLYCOLAX) packet 17 g  17 g Oral BID Trixie Deis R, PA-C       sodium chloride flush (NS) 0.9 % injection 3-10 mL  3-10 mL Intravenous Q12H West Bali, PA-C   3 mL at 10/29/23 9528   sodium chloride flush (NS) 0.9 % injection 3-10 mL  3-10 mL Intravenous PRN West Bali, PA-C       tamsulosin (FLOMAX) capsule 0.4 mg  0.4 mg Oral Daily Thyra Breed A, PA-C   0.4 mg at 10/29/23 4132     Discharge Medications: Please see discharge summary for a list of discharge medications.  Relevant Imaging Results:  Relevant Lab  Results:   Additional Information SSN: [id number]  Reva Bores, LCSWA

## 2023-10-29 NOTE — Progress Notes (Signed)
 Orthopaedic Trauma Progress Note  SUBJECTIVE: No issues this morning.  No chest pain. No SOB. No nausea/vomiting. No other complaints.    OBJECTIVE:  Vitals:   10/29/23 0456 10/29/23 0834  BP: (!) 123/57 (!) 125/50  Pulse: 79 79  Resp: 18 18  Temp: 98.1 F (36.7 C) 98.8 F (37.1 C)  SpO2: 90% 92%    General: Sitting up in bed, no acute distress.  Pleasant and cooperative Respiratory: No increased work of breathing.  Left lower extremity/pelvis: Dressings clean, dry, intact.  No significant tenderness with palpation over the hip or throughout the thighs.  Ankle dorsiflexion/plantarflexion intact bilaterally.  Endorses sensation of light touch throughout extremities.  2+ DP pulse   IMAGING: Stable post op imaging.   LABS:  Results for orders placed or performed during the hospital encounter of 10/25/23 (from the past 24 hours)  VITAMIN D 25 Hydroxy (Vit-D Deficiency, Fractures)     Status: Abnormal   Collection Time: 10/28/23  2:07 PM  Result Value Ref Range   Vit D, 25-Hydroxy 18.77 (L) 30 - 100 ng/mL  CBC     Status: Abnormal   Collection Time: 10/29/23  7:46 AM  Result Value Ref Range   WBC 8.7 4.0 - 10.5 K/uL   RBC 2.24 (L) 4.22 - 5.81 MIL/uL   Hemoglobin 7.2 (L) 13.0 - 17.0 g/dL   HCT 47.8 (L) 29.5 - 62.1 %   MCV 93.3 80.0 - 100.0 fL   MCH 32.1 26.0 - 34.0 pg   MCHC 34.4 30.0 - 36.0 g/dL   RDW 30.8 65.7 - 84.6 %   Platelets 132 (L) 150 - 400 K/uL   nRBC 0.0 0.0 - 0.2 %  Basic metabolic panel     Status: Abnormal   Collection Time: 10/29/23  7:46 AM  Result Value Ref Range   Sodium 136 135 - 145 mmol/L   Potassium 4.3 3.5 - 5.1 mmol/L   Chloride 106 98 - 111 mmol/L   CO2 23 22 - 32 mmol/L   Glucose, Bld 165 (H) 70 - 99 mg/dL   BUN 24 (H) 8 - 23 mg/dL   Creatinine, Ser 9.62 (H) 0.61 - 1.24 mg/dL   Calcium 7.8 (L) 8.9 - 10.3 mg/dL   GFR, Estimated 47 (L) >60 mL/min   Anion gap 7 5 - 15    ASSESSMENT: John Sanford is a 89 y.o. male, 2 Days Post-Op s/p  PERCUTANEOUS FIXATION LEFT LC PELVIC RING INJURY  CV/Blood loss: Acute blood loss anemia, Hgb 7.2 this morning. Hemodynamically stable  PLAN: Weightbearing: WBAT RLE.  WB LLE for transfers only ROM: Unrestricted ROM Incisional and dressing care: Reinforce dressings as needed  Showering: Okay to begin showering getting incisions wet 10/30/2023 Orthopedic device(s): None  Pain management:  1. Tylenol 1000 mg q 8 hours scheduled 2. Robaxin 500 mg q 6 hours PRN 3. Oxycodone 2.5-5 mg q 4 hours PRN 4. Morphine 2 mg q 3 hours PRN 5. Lidoderm 5% patch q 24 hours VTE prophylaxis: Lovenox, SCDs ID:  Ancef 2gm post op Foley/Lines:  No foley, KVO IVFs Impediments to Fracture Healing: Vitamin D level 25 when checked 12/2022.  Vitamin D level 18.77.  Dispo: Okay for discharge from ortho standpoint once cleared by medicine team and therapies  D/C recommendations: -Oxycodone 2.5 to 5 mg for pain control -Aspirin 325 mg daily x 30 days for DVT prophylaxis -Possible need for Vit D supplementation  Follow - up plan: 2 weeks after discharge for wound  check and repeat x-rays   Contact information:  After hours and holidays please check [email or web address] for group call information for [facility]  Alfonse Alpers, PA-C 10/29/2023

## 2023-10-29 NOTE — Progress Notes (Addendum)
 Progress Note  2 Days Post-Op  Subjective: Patient getting up with assistance to urinate and get into chair to eat breakfast. Had a BM yesterday. Pain well controlled.   Objective: Vital signs in last 24 hours: Temp:  [98.1 F (36.7 C)-98.8 F (37.1 C)] 98.8 F (37.1 C) (03/15 0834) Pulse Rate:  [79-90] 79 (03/15 0834) Resp:  [18] 18 (03/15 0834) BP: (118-125)/(50-94) 125/50 (03/15 0834) SpO2:  [90 %-98 %] 92 % (03/15 0834) Last BM Date : 10/25/23  Intake/Output from previous day: 03/14 0701 - 03/15 0700 In: 3053.8 [P.O.:720; I.V.:2333.8] Out: -  Intake/Output this shift: No intake/output data recorded.  PE: General: pleasant, WD, male who is laying in bed in NAD Lungs: CTAB, no wheezes, rhonchi, or rales noted.  Respiratory effort nonlabored on room air Abd: soft, NT, ND MSK: all 4 extremities are symmetrical with no cyanosis, clubbing, or edema. Skin: warm and dry Psych: A&Ox3 with an appropriate affect.    Lab Results:  Recent Labs    10/28/23 0407 10/29/23 0746  WBC 10.7* 8.7  HGB 8.1* 7.2*  HCT 24.3* 20.9*  PLT 116* 132*   BMET Recent Labs    10/28/23 0407 10/29/23 0746  NA 135 136  K 4.2 4.3  CL 105 106  CO2 22 23  GLUCOSE 156* 165*  BUN 29* 24*  CREATININE 1.61* 1.44*  CALCIUM 8.0* 7.8*   PT/INR No results for input(s): "LABPROT", "INR" in the last 72 hours. CMP     Component Value Date/Time   NA 136 10/29/2023 0746   K 4.3 10/29/2023 0746   CL 106 10/29/2023 0746   CO2 23 10/29/2023 0746   GLUCOSE 165 (H) 10/29/2023 0746   BUN 24 (H) 10/29/2023 0746   CREATININE 1.44 (H) 10/29/2023 0746   CALCIUM 7.8 (L) 10/29/2023 0746   PROT 7.4 04/29/2023 1014   ALBUMIN 4.1 04/29/2023 1014   AST 20 04/29/2023 1014   ALT 18 04/29/2023 1014   ALKPHOS 56 04/29/2023 1014   BILITOT 0.6 04/29/2023 1014   GFRNONAA 47 (L) 10/29/2023 0746   GFRAA >60 04/03/2020 1433   Lipase  No results found for: "LIPASE"     Studies/Results: DG Pelvis Comp  Min 3V Result Date: 10/27/2023 CLINICAL DATA:  Postop pelvic fracture fixation EXAM: JUDET PELVIS - 3+ VIEW COMPARISON:  10/26/2023, CT 10/25/2023 FINDINGS: Left hip replacement with normal alignment. Previous hernia repair. Interval single screw fixation across the sacrum and SI joints and placement of single fixating screw across the left superior pubic ramus. Comminuted superior and inferior pubic rami fractures on the left with mild residual displacement. Pubic symphysis appears intact. Moderate right hip degenerative changes. IMPRESSION: Interval screw fixation across the sacrum and SI joints and left superior pubic ramus. Comminuted left superior and inferior pubic rami fractures with mild residual displacement. Electronically Signed   By: Jasmine Pang M.D.   On: 10/27/2023 15:57   DG HIP UNILAT WITH PELVIS 2-3 VIEWS LEFT Result Date: 10/27/2023 CLINICAL DATA:  Elective surgery. EXAM: DG HIP (WITH OR WITHOUT PELVIS) 2-3V LEFT COMPARISON:  Pelvic radiograph yesterday FINDINGS: Thirteen fluoroscopic spot views of the pelvis and left hip submitted from the operating room. Screw traverses the left superior pubic ramus fracture. Left inferior ramus fracture is unchanged. Screw traverses both sacroiliac joints. Fluoroscopy time 1 minutes 33 seconds. Dose 26.84 mGy. IMPRESSION: Intraoperative fluoroscopy during left superior pubic ramus fracture and sacroiliac fixation. Electronically Signed   By: Narda Rutherford M.D.   On: 10/27/2023  11:25   DG C-Arm 1-60 Min-No Report Result Date: 10/27/2023 Fluoroscopy was utilized by the requesting physician.  No radiographic interpretation.    Anti-infectives: Anti-infectives (From admission, onward)    Start     Dose/Rate Route Frequency Ordered Stop   10/27/23 1800  ceFAZolin (ANCEF) IVPB 2g/100 mL premix        2 g 200 mL/hr over 30 Minutes Intravenous Every 12 hours 10/27/23 1218 10/28/23 0657   10/27/23 0900  ceFAZolin (ANCEF) IVPB 2g/100 mL premix         2 g 200 mL/hr over 30 Minutes Intravenous On call to O.R. 10/27/23 0801 10/27/23 1015   10/27/23 0823  ceFAZolin (ANCEF) 2-4 GM/100ML-% IVPB       Note to Pharmacy: Garen Lah E: cabinet override      10/27/23 0823 10/27/23 0951        Assessment/Plan Fall off roof L2-6 rib fractures - multimodal pain control, IS, pulm toilet L superior and inferior pubic rami fractures - per orthopedic surgery. S/p perc fixation today by Dr. Jena Gauss.  Left sacral ala fracture with hemorrhage in pre-sacral space, left pelvic sidewall and anterior extraperitoneal pelvis - per orthopedic surgery, s/p perc fixation 3/13 by Dr. Jena Gauss. WBAT RLE. WB for transfers LLE Possible cystitis - UA not suggestive of UTI RUL pulmonary nodule - recommend outpatient follow up. Discussed with patient and daughter BPH - home meds HTN - home meds and monitor BP HLD AKI on CKD - Cr improving, 1.4 today and baseline is around 1.3, SLIV and recheck labs in AM T2DM - ok to have glipizide, resume metformin ABL Anemia - hgb 7.2 from 8.1, suspect some component of dilutional in addition to ABL. Start Iron and Vit C and will recheck CBC tomorrow AM Hearing loss - recommend outpatient audiology follow up  FEN: HH diet, SLIV VTE: LMWH ID: periop ancef   Dispo: AM CBC. Working on SNF, would be medically stable tomorrow if hgb and Cr stable.   I reviewed [facility] notes, last 24 h vitals and pain scores, last 48 h intake and output, last 24 h labs and trends, and last 24 h imaging results.    LOS: 3 days   Juliet Rude, [facility] Surgery 10/29/2023, 10:04 AM Please see Amion for pager number during day hours 7:00am-4:30pm

## 2023-10-29 NOTE — TOC Progression Note (Signed)
 Transition of Care [facility]) - Progression Note    Patient Details  Name: John Sanford MRN: [id number] Date of Birth: 06/07/1933  Transition [facility] Care [facility]) CM/SW Contact  Nicanor Bake Phone Number: [phone] 10/29/2023, 9:38 AM  Clinical Narrative:   HF CSW completed the pts FL2 and faxed to SNFs in the [place] area.   9:42 AM- HF CSW called the pts daughter and shared that he pt was faxed out and bed offers are pending. CSW explained the SNF process. Pt and family agrees.   TOC will continue following.     Expected Discharge Plan: Skilled Nursing Facility Barriers to Discharge: Continued Medical Work up  Expected Discharge Plan and Services   Discharge Planning Services: CM Consult   Living arrangements for the past 2 months: Single Family Home                                       Social Determinants of Health (SDOH) Interventions SDOH Screenings   Food Insecurity: No Food Insecurity (10/26/2023)  Housing: Low Risk  (10/26/2023)  Transportation Needs: No Transportation Needs (10/26/2023)  Utilities: At Risk (10/26/2023)  Alcohol Screen: Low Risk  (01/04/2023)  Depression (PHQ2-9): Low Risk  (01/14/2023)  Financial Resource Strain: Low Risk  (01/04/2023)  Physical Activity: Inactive (01/04/2023)  Social Connections: Moderately Isolated (10/26/2023)  Stress: No Stress Concern Present (01/04/2023)  Tobacco Use: Medium Risk (10/27/2023)    Readmission Risk Interventions     No data to display

## 2023-10-30 MED ORDER — PHENOL 1.4 % MT LIQD
1.0000 | OROMUCOSAL | Status: DC | PRN
  Administered 2023-10-30: 1 via OROMUCOSAL
  Filled 2023-10-30: qty 177

## 2023-10-30 NOTE — Plan of Care (Signed)
  Problem: Activity: Goal: Risk for activity intolerance will decrease Outcome: Progressing   Problem: Elimination: Goal: Will not experience complications related to urinary retention Outcome: Progressing   Problem: Safety: Goal: Ability to remain free from injury will improve Outcome: Progressing   

## 2023-10-30 NOTE — Progress Notes (Addendum)
    Assessment & Plan: Fall off roof L2-6 rib fractures  - multimodal pain control, IS, pulm toilet L superior and inferior pubic rami fractures  - S/p perc fixation by Dr. Jena Gauss  Left sacral ala fracture - s/p perc fixation 3/13 by Dr. Jena Gauss - WBAT RLE. WB for transfers LLE RUL pulmonary nodule - recommend outpatient follow up. Discussed with patient and daughter BPH  - home meds HTN  - home meds and monitor BP HLD AKI on CKD - Cr improving, 1.44 yesterday T2DM - ok to have glipizide, resume metformin ABL Anemia  - hgb 7.2 yesterday Hearing loss  - recommend outpatient audiology follow up   FEN: HH diet, SLIV VTE: LMWH ID: periop ancef   Dispo: Working on SNF, medically stable if hgb and Cr remain stable        Darnell Level, MD [facility] A [facility] practice Office: [phone]        Chief Complaint: Fall from roof  Subjective: Patient up in chair, ate breakfast.  No complaints.  Objective: Vital signs in last 24 hours: Temp:  [98.1 F (36.7 C)-99.1 F (37.3 C)] 98.1 F (36.7 C) (03/16 0749) Pulse Rate:  [77-86] 77 (03/16 0749) Resp:  [17-18] 18 (03/16 0749) BP: (121-140)/(54-64) 129/64 (03/16 0749) SpO2:  [88 %-93 %] 88 % (03/16 0749) Last BM Date : 10/29/23  Intake/Output from previous day: No intake/output data recorded. Intake/Output this shift: Total I/O In: 480 [P.O.:480] Out: 600 [Urine:600]  Physical Exam: HEENT - sclerae clear, mucous membranes moist Abdomen - protuberant; non-tender Ext - dressings at pubis, left hip dry and intact  Lab Results:  Recent Labs    10/28/23 0407 10/29/23 0746  WBC 10.7* 8.7  HGB 8.1* 7.2*  HCT 24.3* 20.9*  PLT 116* 132*   BMET Recent Labs    10/28/23 0407 10/29/23 0746  NA 135 136  K 4.2 4.3  CL 105 106  CO2 22 23  GLUCOSE 156* 165*  BUN 29* 24*  CREATININE 1.61* 1.44*  CALCIUM 8.0* 7.8*   PT/INR No results for input(s): "LABPROT", "INR" in the last 72  hours. Comprehensive Metabolic Panel:    Component Value Date/Time   NA 136 10/29/2023 0746   NA 135 10/28/2023 0407   K 4.3 10/29/2023 0746   K 4.2 10/28/2023 0407   CL 106 10/29/2023 0746   CL 105 10/28/2023 0407   CO2 23 10/29/2023 0746   CO2 22 10/28/2023 0407   BUN 24 (H) 10/29/2023 0746   BUN 29 (H) 10/28/2023 0407   CREATININE 1.44 (H) 10/29/2023 0746   CREATININE 1.61 (H) 10/28/2023 0407   GLUCOSE 165 (H) 10/29/2023 0746   GLUCOSE 156 (H) 10/28/2023 0407   CALCIUM 7.8 (L) 10/29/2023 0746   CALCIUM 8.0 (L) 10/28/2023 0407   AST 20 04/29/2023 1014   AST 22 12/24/2022 0823   ALT 18 04/29/2023 1014   ALT 18 12/24/2022 0823   ALKPHOS 56 04/29/2023 1014   ALKPHOS 56 12/24/2022 0823   BILITOT 0.6 04/29/2023 1014   BILITOT 0.5 12/24/2022 0823   PROT 7.4 04/29/2023 1014   PROT 6.4 01/05/2023 1118   ALBUMIN 4.1 04/29/2023 1014   ALBUMIN 3.9 12/24/2022 0823    Studies/Results: No results found.    Darnell Level 10/30/2023  Patient ID: Rutherford Guys, male   DOB: 02-05-1933, 89 y.o.   MRN: [id number]

## 2023-10-31 LAB — CBC
HCT: 20.6 % — ABNORMAL LOW (ref 39.0–52.0)
Hemoglobin: 7 g/dL — ABNORMAL LOW (ref 13.0–17.0)
MCH: 31.7 pg (ref 26.0–34.0)
MCHC: 34 g/dL (ref 30.0–36.0)
MCV: 93.2 fL (ref 80.0–100.0)
Platelets: 187 10*3/uL (ref 150–400)
RBC: 2.21 MIL/uL — ABNORMAL LOW (ref 4.22–5.81)
RDW: 13.5 % (ref 11.5–15.5)
WBC: 7.6 10*3/uL (ref 4.0–10.5)
nRBC: 0 % (ref 0.0–0.2)

## 2023-10-31 LAB — BPAM RBC
Blood Product Expiration Date: 202503232359
Blood Product Expiration Date: 202503252359
Unit Type and Rh: 9500
Unit Type and Rh: 9500

## 2023-10-31 LAB — PREPARE RBC (CROSSMATCH)

## 2023-10-31 LAB — TYPE AND SCREEN
ABO/RH(D): O NEG
Antibody Screen: NEGATIVE
Unit division: 0
Unit division: 0

## 2023-10-31 LAB — HEMOGLOBIN AND HEMATOCRIT, BLOOD
HCT: 25.4 % — ABNORMAL LOW (ref 39.0–52.0)
Hemoglobin: 8.8 g/dL — ABNORMAL LOW (ref 13.0–17.0)

## 2023-10-31 MED ORDER — SODIUM CHLORIDE 0.9% IV SOLUTION: Freq: Once | INTRAVENOUS | Status: AC

## 2023-10-31 MED ORDER — LIDOCAINE 5 % EX PTCH
1.0000 | MEDICATED_PATCH | CUTANEOUS | Status: DC
  Administered 2023-10-31: 1 via TRANSDERMAL

## 2023-10-31 NOTE — Progress Notes (Signed)
 Physical Therapy Treatment Patient Details Name: John Sanford MRN: [id number] DOB: [birth year] Today's Date: 10/31/2023   History of Present Illness Pt is an 89 y.o. male admitted into ED on 10/25/23 for fall off roof. CT & x-rays showed L sacral & pubic rami fx, L 2-6 rib fx. Closed reduction of pelvis performed 3/13. RLE WBAT, LLE WB for transfers only. PMH: arthrisits, skin cancer, DM, HTN, pneumonia, L THA    PT Comments  Pt eager to get OOB and use the bathroom upon PT arrival to room. Pt overall mobilizing at min-mod assist level for transfers, tolerating transfers well but very limited WB through LLE given pain and pt caution. PT assisted pt in LE HEP, and administered handout for pt and family to follow. Plan remains appropriate.      If plan is discharge home, recommend the following: Help with stairs or ramp for entrance;Assist for transportation;A lot of help with bathing/dressing/bathroom;Assistance with cooking/housework;A little help with bathing/dressing/bathroom;A little help with walking and/or transfers   Can travel by private vehicle        Equipment Recommendations  Rolling walker (2 wheels)    Recommendations for Other Services       Precautions / Restrictions Precautions Precautions: Fall Restrictions RLE Weight Bearing Per Provider Order: Weight bearing as tolerated LLE Weight Bearing Per Provider Order: Weight bearing as tolerated Other Position/Activity Restrictions: LLE WB for transfers only     Mobility  Bed Mobility Overal bed mobility: Needs Assistance Bed Mobility: Rolling, Sidelying to Sit, Sit to Sidelying Rolling: Min assist Sidelying to sit: Min assist     Sit to sidelying: Min assist General bed mobility comments: assist for LE translation to/from EOB.    Transfers Overall transfer level: Needs assistance Equipment used: Rolling walker (2 wheels) Transfers: Sit to/from Stand, Bed to chair/wheelchair/BSC Sit to Stand: Mod  assist Stand pivot transfers: Mod assist         General transfer comment: assist for power up, steady, and safe pivot to/from [facility]. pt wtih very limited WB through LLE due to pain and caution    Ambulation/Gait               General Gait Details: NT, orders for transfers only   Stairs             Wheelchair Mobility     Tilt Bed    Modified Rankin (Stroke Patients Only)       Balance Overall balance assessment: Needs assistance   Sitting balance-Leahy Scale: Good     Standing balance support: Bilateral upper extremity supported Standing balance-Leahy Scale: Poor Standing balance comment: UE support for pain and balance                            Communication Communication Communication: No apparent difficulties Factors Affecting Communication: Hearing impaired (put in hearing aides)  Cognition Arousal: Alert Behavior During Therapy: WFL for tasks assessed/performed   PT - Cognitive impairments: No apparent impairments                         Following commands: Intact      Cueing    Exercises Total Joint Exercises Ankle Circles/Pumps: AROM, Both, 10 reps, Supine Quad Sets: AROM, Both, Supine, 10 reps Hip ABduction/ADduction: AAROM, Left, 10 reps, Supine Other Exercises Other Exercises: HEP provided and demonstrated: Supine Ankle Pumps  - 3 x daily - 7 x weekly -  1 sets - 15 reps - 1 second hold  - Supine Gluteal Sets  - 3 x daily - 7 x weekly - 1 sets - 10 reps - 1 second hold  - Supine Quad Set  - 3 x daily - 7 x weekly - 1 sets - 15 reps - 1 second hold    General Comments        Pertinent Vitals/Pain Pain Assessment Pain Assessment: Faces Faces Pain Scale: Hurts even more Pain Location: soreness L ribs and groin Pain Descriptors / Indicators: Sore Pain Intervention(s): Limited activity within patient's tolerance, Monitored during session, Repositioned    Home Living                          Prior  Function            PT Goals (current goals can now be found in the care plan section) Acute Rehab PT Goals Patient Stated Goal: to go to rehab PT Goal Formulation: With patient/family Time For Goal Achievement: 11/11/23 Potential to Achieve Goals: Good Progress towards PT goals: Progressing toward goals    Frequency    Min 3X/week      PT Plan      Co-evaluation              AM-PAC PT "6 Clicks" Mobility   Outcome Measure  Help needed turning from your back to your side while in a flat bed without using bedrails?: A Little Help needed moving from lying on your back to sitting on the side of a flat bed without using bedrails?: A Little Help needed moving to and from a bed to a chair (including a wheelchair)?: A Little Help needed standing up from a chair using your arms (e.g., wheelchair or bedside chair)?: A Little Help needed to walk in hospital room?: Total Help needed climbing 3-5 steps with a railing? : Total 6 Click Score: 14    End of Session Equipment Utilized During Treatment: Gait belt Activity Tolerance: Patient tolerated treatment well Patient left: with call bell/phone within reach;with family/visitor present;in bed;with bed alarm set;with SCD's reapplied Nurse Communication: Mobility status PT Visit Diagnosis: Difficulty in walking, not elsewhere classified (R26.2);Pain Pain - Right/Left: Left Pain - part of body: Hip     Time: 2841-3244 PT Time Calculation (min) (ACUTE ONLY): 43 min  Charges:    $Therapeutic Exercise: 8-22 mins $Therapeutic Activity: 23-37 mins PT General Charges $$ ACUTE PT VISIT: 1 Visit                     John Sanford, PT DPT Acute Rehabilitation Services Secure Chat Preferred  Office [phone]    John Sanford E John Sanford 10/31/2023, 5:04 PM

## 2023-10-31 NOTE — TOC Progression Note (Signed)
 Transition of Care [facility]) - Progression Note    Patient Details  Name: John Sanford MRN: [id number] Date of Birth: March 08, 1933  Transition [facility] Care [facility]) CM/SW Contact  Astrid Drafts Berna Spare, RN Phone Number: 10/31/2023,2:40pm  Clinical Narrative:    Bed offers given to patient's daughter, Joice Lofts and son-in-law at bedside.  Family prefers [facility] [place] as first choice; [facility] is second choice, and they have offered a bed.  Left message with [facility] in admissions at [facility], requesting review of referral.  She states referral is currently in review, and they should have an answer soon. Updated patient's daughter on status of SNF referral.    Expected Discharge Plan: Skilled Nursing Facility Barriers to Discharge: Continued Medical Work up  Expected Discharge Plan and Services   Discharge Planning Services: CM Consult Post Acute Care Choice: Skilled Nursing Facility Living arrangements for the past 2 months: Single Family Home                                       Social Determinants of Health (SDOH) Interventions SDOH Screenings   Food Insecurity: No Food Insecurity (10/26/2023)  Housing: Low Risk  (10/26/2023)  Transportation Needs: No Transportation Needs (10/26/2023)  Utilities: At Risk (10/26/2023)  Alcohol Screen: Low Risk  (01/04/2023)  Depression (PHQ2-9): Low Risk  (01/14/2023)  Financial Resource Strain: Low Risk  (01/04/2023)  Physical Activity: Inactive (01/04/2023)  Social Connections: Moderately Isolated (10/26/2023)  Stress: No Stress Concern Present (01/04/2023)  Tobacco Use: Medium Risk (10/27/2023)    Readmission Risk Interventions     No data to display         Quintella Baton, RN, BSN  Trauma/Neuro ICU Case Manager [phone]

## 2023-10-31 NOTE — TOC Progression Note (Signed)
 Transition of Care [facility]) - Progression Note    Patient Details  Name: John Sanford MRN: [id number] Date of Birth: June 08, 1933  Transition of Care [facility]) CM/SW Contact  Nicanor Bake Phone Number: [phone] 10/31/2023, 2:09 PM  Clinical Narrative: 1:56 PM- HF CSW received a call from pts daughter, Amber over the phone. Amber wanted to know SNF accepted updates. CSW shared the facilities that have accepted and explained that SNF process. Amber stated that their #1 Choice is [facility]- explained that offer is pending #2 [facility]. Denyse Dago stated that she needed to discuss with her brother, but leaning towards those two options. CSW stated that she will follow-up with unit CSW and ask to communicate with pt and family.   HF CSW called and spoke with unit CSW and shared updates.   TOC will continue following.       Expected Discharge Plan: Skilled Nursing Facility Barriers to Discharge: Continued Medical Work up  Expected Discharge Plan and Services   Discharge Planning Services: CM Consult   Living arrangements for the past 2 months: Single Family Home                                       Social Determinants of Health (SDOH) Interventions SDOH Screenings   Food Insecurity: No Food Insecurity (10/26/2023)  Housing: Low Risk  (10/26/2023)  Transportation Needs: No Transportation Needs (10/26/2023)  Utilities: At Risk (10/26/2023)  Alcohol Screen: Low Risk  (01/04/2023)  Depression (PHQ2-9): Low Risk  (01/14/2023)  Financial Resource Strain: Low Risk  (01/04/2023)  Physical Activity: Inactive (01/04/2023)  Social Connections: Moderately Isolated (10/26/2023)  Stress: No Stress Concern Present (01/04/2023)  Tobacco Use: Medium Risk (10/27/2023)    Readmission Risk Interventions     No data to display

## 2023-10-31 NOTE — Progress Notes (Signed)
 Progress Note  4 Days Post-Op  Subjective: Up in chair, eating breakfast.  No complaints today.  Feels a little weak  Objective: Vital signs in last 24 hours: Temp:  [97.6 F (36.4 C)-98.5 F (36.9 C)] 97.8 F (36.6 C) (03/17 0759) Pulse Rate:  [81-89] 81 (03/17 0759) Resp:  [16-17] 16 (03/17 0242) BP: (129-144)/(52-56) 138/56 (03/17 0759) SpO2:  [94 %-97 %] 94 % (03/17 0759) Last BM Date : 10/31/23  Intake/Output from previous day: 03/16 0701 - 03/17 0700 In: 960 [P.O.:960] Out: 1250 [Urine:1250] Intake/Output this shift: Total I/O In: -  Out: 300 [Urine:300]  PE: General: pleasant, WD, male sitting in his chair Lungs: CTAB, no wheezes, rhonchi, or rales noted.  Respiratory effort nonlabored on room air Abd: soft, NT, ND MSK: all 4 extremities are symmetrical with no cyanosis, clubbing, or edema. Skin: warm and dry Psych: A&Ox3 with an appropriate affect.    Lab Results:  Recent Labs    10/29/23 0746 10/31/23 0625  WBC 8.7 7.6  HGB 7.2* 7.0*  HCT 20.9* 20.6*  PLT 132* 187   BMET Recent Labs    10/29/23 0746  NA 136  K 4.3  CL 106  CO2 23  GLUCOSE 165*  BUN 24*  CREATININE 1.44*  CALCIUM 7.8*   PT/INR No results for input(s): "LABPROT", "INR" in the last 72 hours. CMP     Component Value Date/Time   NA 136 10/29/2023 0746   K 4.3 10/29/2023 0746   CL 106 10/29/2023 0746   CO2 23 10/29/2023 0746   GLUCOSE 165 (H) 10/29/2023 0746   BUN 24 (H) 10/29/2023 0746   CREATININE 1.44 (H) 10/29/2023 0746   CALCIUM 7.8 (L) 10/29/2023 0746   PROT 7.4 04/29/2023 1014   ALBUMIN 4.1 04/29/2023 1014   AST 20 04/29/2023 1014   ALT 18 04/29/2023 1014   ALKPHOS 56 04/29/2023 1014   BILITOT 0.6 04/29/2023 1014   GFRNONAA 47 (L) 10/29/2023 0746   GFRAA >60 04/03/2020 1433   Lipase  No results found for: "LIPASE"     Studies/Results: No results found.   Anti-infectives: Anti-infectives (From admission, onward)    Start     Dose/Rate Route  Frequency Ordered Stop   10/27/23 1800  ceFAZolin (ANCEF) IVPB 2g/100 mL premix        2 g 200 mL/hr over 30 Minutes Intravenous Every 12 hours 10/27/23 1218 10/28/23 0657   10/27/23 0900  ceFAZolin (ANCEF) IVPB 2g/100 mL premix        2 g 200 mL/hr over 30 Minutes Intravenous On call to O.R. 10/27/23 0801 10/27/23 1015   10/27/23 0823  ceFAZolin (ANCEF) 2-4 GM/100ML-% IVPB       Note to Pharmacy: Garen Lah E: cabinet override      10/27/23 0823 10/27/23 0951        Assessment/Plan Fall off roof L2-6 rib fractures - multimodal pain control, IS, pulm toilet L superior and inferior pubic rami fractures - per orthopedic surgery. S/p perc fixation today by Dr. Jena Gauss.  Left sacral ala fracture with hemorrhage in pre-sacral space, left pelvic sidewall and anterior extraperitoneal pelvis - per orthopedic surgery, s/p perc fixation 3/13 by Dr. Jena Gauss. WBAT RLE. WB for transfers LLE Possible cystitis - UA not suggestive of UTI RUL pulmonary nodule - recommend outpatient follow up. Discussed with patient and daughter BPH - home meds HTN - home meds and monitor BP HLD AKI on CKD - Cr improving, 1.4 today and baseline is around  1.3, SLIV and recheck labs in AM T2DM - ok to have glipizide, resume metformin ABL Anemia - hgb 7.0.  will give 1 unit pRBC today.  Cbc in am if still here. Started Iron and Vit C  Hearing loss - recommend outpatient audiology follow up  FEN: HH diet, SLIV VTE: LMWH ID: periop ancef   Dispo: medically stable for SNF after 1 unit of pRBCs given.  Cbc in am if still here.  I reviewed [facility] notes, last 24 h vitals and pain scores, last 48 h intake and output, last 24 h labs and trends, and last 24 h imaging results.    LOS: 5 days   Letha Cape, [facility] Surgery 10/31/2023, 9:21 AM Please see Amion for pager number during day hours 7:00am-4:30pm

## 2023-10-31 NOTE — Plan of Care (Signed)

## 2023-11-01 DIAGNOSIS — S32110D Nondisplaced Zone I fracture of sacrum, subsequent encounter for fracture with routine healing: Secondary | ICD-10-CM | POA: Diagnosis not present

## 2023-11-01 DIAGNOSIS — S32811D Multiple fractures of pelvis with unstable disruption of pelvic ring, subsequent encounter for fracture with routine healing: Secondary | ICD-10-CM | POA: Diagnosis not present

## 2023-11-01 DIAGNOSIS — S3219XA Other fracture of sacrum, initial encounter for closed fracture: Secondary | ICD-10-CM | POA: Diagnosis not present

## 2023-11-01 DIAGNOSIS — S32110A Nondisplaced Zone I fracture of sacrum, initial encounter for closed fracture: Secondary | ICD-10-CM | POA: Diagnosis not present

## 2023-11-01 DIAGNOSIS — I10 Essential (primary) hypertension: Secondary | ICD-10-CM | POA: Diagnosis not present

## 2023-11-01 DIAGNOSIS — R062 Wheezing: Secondary | ICD-10-CM | POA: Diagnosis not present

## 2023-11-01 DIAGNOSIS — Z7401 Bed confinement status: Secondary | ICD-10-CM | POA: Diagnosis not present

## 2023-11-01 DIAGNOSIS — R0781 Pleurodynia: Secondary | ICD-10-CM | POA: Diagnosis not present

## 2023-11-01 DIAGNOSIS — N400 Enlarged prostate without lower urinary tract symptoms: Secondary | ICD-10-CM | POA: Diagnosis not present

## 2023-11-01 DIAGNOSIS — R911 Solitary pulmonary nodule: Secondary | ICD-10-CM | POA: Diagnosis not present

## 2023-11-01 DIAGNOSIS — S32592A Other specified fracture of left pubis, initial encounter for closed fracture: Secondary | ICD-10-CM | POA: Diagnosis not present

## 2023-11-01 DIAGNOSIS — W19XXXD Unspecified fall, subsequent encounter: Secondary | ICD-10-CM | POA: Diagnosis not present

## 2023-11-01 DIAGNOSIS — S32512D Fracture of superior rim of left pubis, subsequent encounter for fracture with routine healing: Secondary | ICD-10-CM | POA: Diagnosis not present

## 2023-11-01 DIAGNOSIS — D62 Acute posthemorrhagic anemia: Secondary | ICD-10-CM | POA: Diagnosis not present

## 2023-11-01 DIAGNOSIS — R531 Weakness: Secondary | ICD-10-CM | POA: Diagnosis not present

## 2023-11-01 DIAGNOSIS — E785 Hyperlipidemia, unspecified: Secondary | ICD-10-CM | POA: Diagnosis not present

## 2023-11-01 DIAGNOSIS — M25552 Pain in left hip: Secondary | ICD-10-CM | POA: Diagnosis not present

## 2023-11-01 DIAGNOSIS — Z743 Need for continuous supervision: Secondary | ICD-10-CM | POA: Diagnosis not present

## 2023-11-01 DIAGNOSIS — K5900 Constipation, unspecified: Secondary | ICD-10-CM | POA: Diagnosis not present

## 2023-11-01 DIAGNOSIS — N178 Other acute kidney failure: Secondary | ICD-10-CM | POA: Diagnosis not present

## 2023-11-01 DIAGNOSIS — S32512A Fracture of superior rim of left pubis, initial encounter for closed fracture: Secondary | ICD-10-CM | POA: Diagnosis not present

## 2023-11-01 DIAGNOSIS — N3090 Cystitis, unspecified without hematuria: Secondary | ICD-10-CM | POA: Diagnosis not present

## 2023-11-01 DIAGNOSIS — S2242XA Multiple fractures of ribs, left side, initial encounter for closed fracture: Secondary | ICD-10-CM | POA: Diagnosis not present

## 2023-11-01 DIAGNOSIS — E1151 Type 2 diabetes mellitus with diabetic peripheral angiopathy without gangrene: Secondary | ICD-10-CM | POA: Diagnosis not present

## 2023-11-01 DIAGNOSIS — S32592D Other specified fracture of left pubis, subsequent encounter for fracture with routine healing: Secondary | ICD-10-CM | POA: Diagnosis not present

## 2023-11-01 DIAGNOSIS — J811 Chronic pulmonary edema: Secondary | ICD-10-CM | POA: Diagnosis not present

## 2023-11-01 DIAGNOSIS — S2242XD Multiple fractures of ribs, left side, subsequent encounter for fracture with routine healing: Secondary | ICD-10-CM | POA: Diagnosis not present

## 2023-11-01 DIAGNOSIS — E46 Unspecified protein-calorie malnutrition: Secondary | ICD-10-CM | POA: Diagnosis not present

## 2023-11-01 LAB — BPAM RBC
Blood Product Expiration Date: 202503192359
ISSUE DATE / TIME: 202503171330
Unit Type and Rh: 9500

## 2023-11-01 LAB — TYPE AND SCREEN
ABO/RH(D): O NEG
Antibody Screen: NEGATIVE
Unit division: 0

## 2023-11-01 LAB — CBC
HCT: 24.4 % — ABNORMAL LOW (ref 39.0–52.0)
Hemoglobin: 8.3 g/dL — ABNORMAL LOW (ref 13.0–17.0)
MCH: 31.4 pg (ref 26.0–34.0)
MCHC: 34 g/dL (ref 30.0–36.0)
MCV: 92.4 fL (ref 80.0–100.0)
Platelets: 240 10*3/uL (ref 150–400)
RBC: 2.64 MIL/uL — ABNORMAL LOW (ref 4.22–5.81)
RDW: 14.7 % (ref 11.5–15.5)
WBC: 7.9 10*3/uL (ref 4.0–10.5)
nRBC: 0 % (ref 0.0–0.2)

## 2023-11-01 MED ORDER — ASCORBIC ACID 500 MG PO TABS: 500.0000 mg | ORAL_TABLET | Freq: Two times a day (BID) | ORAL | Status: AC

## 2023-11-01 MED ORDER — FERROUS SULFATE 325 (65 FE) MG PO TABS: 325.0000 mg | ORAL_TABLET | Freq: Two times a day (BID) | ORAL | Status: AC

## 2023-11-01 MED ORDER — VITAMIN D (ERGOCALCIFEROL) 1.25 MG (50000 UNIT) PO CAPS: 50000.0000 [IU] | ORAL_CAPSULE | ORAL | Status: AC

## 2023-11-01 MED ORDER — ACETAMINOPHEN 500 MG PO TABS: 1000.0000 mg | ORAL_TABLET | Freq: Three times a day (TID) | ORAL | Status: DC | PRN

## 2023-11-01 MED ORDER — ASPIRIN 325 MG PO TBEC
325.0000 mg | DELAYED_RELEASE_TABLET | Freq: Every day | ORAL | Status: AC
Start: 2023-11-01 — End: 2023-12-01

## 2023-11-01 MED ORDER — METHOCARBAMOL 500 MG PO TABS: 500.0000 mg | ORAL_TABLET | Freq: Three times a day (TID) | ORAL | Status: AC | PRN

## 2023-11-01 NOTE — TOC Progression Note (Signed)
 Transition of Care [facility]) - Progression Note    Patient Details  Name: John Sanford MRN: [id number] Date of Birth: 03/05/1933  Transition of Care [facility]) CM/SW Contact  Glennon Mac, RN Phone Number: 11/01/2023, 10:00am  Clinical Narrative:    Spoke with patient/family regarding SNF choice: they prefer [facility], and facility has offered a bed.  Will call facility to accept and initiate insurance authorization.  Addendum: 2:15pm [name] has been Certified In Total 3/18 - 3/66 YQIH#[id number].   Notified facility and they are prepared to accept patient today.  Facility will need discharge summary completed by 3:30pm.  Attending and PA notified. [facility] doctor notified of receiving insurance auth, and plan for discharge today.   Bedside nurse can call report to [phone]; patient going to room 201.      Expected Discharge Plan: Skilled Nursing Facility Barriers to Discharge: Continued Medical Work up  Expected Discharge Plan and Services   Discharge Planning Services: CM Consult Post Acute Care Choice: Skilled Nursing Facility Living arrangements for the past 2 months: Single Family Home                                       Social Determinants of Health (SDOH) Interventions SDOH Screenings   Food Insecurity: No Food Insecurity (10/26/2023)  Housing: Low Risk  (10/26/2023)  Transportation Needs: No Transportation Needs (10/26/2023)  Utilities: At Risk (10/26/2023)  Alcohol Screen: Low Risk  (01/04/2023)  Depression (PHQ2-9): Low Risk  (01/14/2023)  Financial Resource Strain: Low Risk  (01/04/2023)  Physical Activity: Inactive (01/04/2023)  Social Connections: Moderately Isolated (10/26/2023)  Stress: No Stress Concern Present (01/04/2023)  Tobacco Use: Medium Risk (10/27/2023)    Readmission Risk Interventions     No data to display         Quintella Baton, RN, BSN  Trauma/Neuro ICU Case Manager [phone]

## 2023-11-01 NOTE — Discharge Instructions (Addendum)
 Following your surgery until orthopedic follow up you may bear weight as tolerated in your right leg and bear weight only for transfers in your left leg  RIB FRACTURES  HOME INSTRUCTIONS   PAIN CONTROL:  Pain is best controlled by a usual combination of three different methods TOGETHER:  Ice/Heat Over the counter pain medication Prescription pain medication You may experience some swelling and bruising in area of broken ribs. Ice packs or heating pads (30-60 minutes up to 6 times a day) will help. Use ice for the first few days to help decrease swelling and bruising, then switch to heat to help relax tight/sore spots and speed recovery. Some people prefer to use ice alone, heat alone, alternating between ice & heat. Experiment to what works for you. Swelling and bruising can take several weeks to resolve.  It is helpful to take an over-the-counter pain medication regularly for the first few weeks. Choose one of the following that works best for you:  Naproxen (Aleve, etc) Two 220mg  tabs twice a day Ibuprofen (Advil, etc) Three 200mg  tabs four times a day (every meal & bedtime) Acetaminophen (Tylenol, etc) 500-650mg  four times a day (every meal & bedtime) A prescription for pain medication (such as oxycodone, hydrocodone, etc) may be given to you upon discharge. Take your pain medication as prescribed.  If you are having problems/concerns with the prescription medicine (does not control pain, nausea, vomiting, rash, itching, etc), please call us [phone] to see if we need to switch you to a different pain medicine that will work better for you and/or control your side effect better. If you need a refill on your pain medication, please contact your pharmacy. They will contact our office to request authorization. Prescriptions will not be filled after 5 pm or on week-ends. Avoid getting constipated. When taking pain medications, it is common to experience some constipation. Increasing fluid  intake and taking a fiber supplement (such as Metamucil, Citrucel, FiberCon, MiraLax, etc) 1-2 times a day regularly will usually help prevent this problem from occurring. A mild laxative (prune juice, Milk of Magnesia, MiraLax, etc) should be taken according to package directions if there are no bowel movements after 48 hours.  Watch out for diarrhea. If you have many loose bowel movements, simplify your diet to bland foods & liquids for a few days. Stop any stool softeners and decrease your fiber supplement. Switching to mild anti-diarrheal medications (Kayopectate, Pepto Bismol) can help. If this worsens or does not improve, please call us. FOLLOW UP  If a follow up appointment is needed one will be scheduled for you. If none is needed with our trauma team, please follow up with your primary care provider within 2-3 weeks from discharge. Please call [facility] at [phone] if you have any questions about follow up.  If you have any orthopedic or other injuries you will need to follow up as outlined in your follow up instructions.   WHEN TO CALL us [phone]:  Poor pain control Reactions / problems with new medications (rash/itching, nausea, etc)  Fever over 101.5 F (38.5 C) Worsening swelling or bruising Worsening pain, productive cough, difficulty breathing or any other concerning symptoms  The clinic staff is available to answer your questions during regular business hours (8:30am-5pm). Please don't hesitate to call and ask to speak to one of our nurses for clinical concerns.  If you have a medical emergency, go to the nearest emergency room or call 911.  A Careers adviser from [facility] is always on call at the [facility]  [address], [place], [place] [address] ?  MAIN: [phone] ? TOLL FREE: [phone] ?  FAX [phone]  [email or web address]      Information on Rib Fractures  A rib fracture is a  break or crack in one of the bones of the ribs. The ribs are long, curved bones that wrap around your chest and attach to your spine and your breastbone. The ribs protect your heart, lungs, and other organs in the chest. A broken or cracked rib is often painful but is not usually serious. Most rib fractures heal on their own over time. However, rib fractures can be more serious if multiple ribs are broken or if broken ribs move out of place and push against other structures or organs. What are the causes? This condition is caused by: Repetitive movements with high force, such as pitching a baseball or having severe coughing spells. A direct blow to the chest, such as a sports injury, a car accident, or a fall. Cancer that has spread to the bones, which can weaken bones and cause them to break. What are the signs or symptoms? Symptoms of this condition include: Pain when you breathe in or cough. Pain when someone presses on the injured area. Feeling short of breath. How is this diagnosed? This condition is diagnosed with a physical exam and medical history. Imaging tests may also be done, such as: Chest X-ray. CT scan. MRI. Bone scan. Chest ultrasound. How is this treated? Treatment for this condition depends on the severity of the fracture. Most rib fractures usually heal on their own in 1-3 months. Sometimes healing takes longer if there is a cough that does not stop or if there are other activities that make the injury worse (aggravating factors). While you heal, you will be given medicines to control the pain. You will also be taught deep breathing exercises. Severe injuries may require hospitalization or surgery. Follow these instructions at home: Managing pain, stiffness, and swelling If directed, apply ice to the injured area. Put ice in a plastic bag. Place a towel between your skin and the bag. Leave the ice on for 20 minutes, 2-3 times a day. Take over-the-counter and  prescription medicines only as told by your health care provider. Activity Avoid a lot of activity and any activities or movements that cause pain. Be careful during activities and avoid bumping the injured rib. Slowly increase your activity as told by your health care provider. General instructions Do deep breathing exercises as told by your health care provider. This helps prevent pneumonia, which is a common complication of a broken rib. Your health care provider may instruct you to: Take deep breaths several times a day. Try to cough several times a day, holding a pillow against the injured area. Use a device called incentive spirometer to practice deep breathing several times a day. Drink enough fluid to keep your urine pale yellow. Do not wear a rib belt or binder. These restrict breathing, which can lead to pneumonia. Keep all follow-up visits as told by your health care provider. This is important. Contact a health care provider if: You have a fever. Get help right away if: You have difficulty breathing or you are short of breath. You develop a cough that does not stop, or you cough up thick or bloody sputum. You have nausea, vomiting, or pain in your abdomen. Your pain gets worse  and medicine does not help. Summary A rib fracture is a break or crack in one of the bones of the ribs. A broken or cracked rib is often painful but is not usually serious. Most rib fractures heal on their own over time. Treatment for this condition depends on the severity of the fracture. Avoid a lot of activity and any activities or movements that cause pain. This information is not intended to replace advice given to you by your health care provider. Make sure you discuss any questions you have with your health care provider. Document Released: 08/02/2005 Document Revised: 11/01/2016 Document Reviewed: 11/01/2016 Elsevier Interactive Patient Education  2019 [employer].

## 2023-11-01 NOTE — Discharge Summary (Signed)
 Physician Discharge Summary  Patient ID: John Sanford MRN: [id number] DOB/AGE: 1933/07/05 89 y.o.  Admit date: 10/25/2023 Discharge date: 11/01/2023  Admission Diagnoses Hyperkalemia [E87.5] Fall [W19.XXXA] AKI (acute kidney injury) (HCC) [N17.9] Closed fracture of multiple ribs of left side, initial encounter [S22.42XA] Closed nondisplaced fracture of pelvis, unspecified part of pelvis, initial encounter (HCC) [S32.9XXA]  Discharge Diagnoses Left 2-6 rib fractures  Left superior and inferior pubic rami fractures  Left sacral ala fracture with hemorrhage in pre-sacral space, left pelvic sidewall and anterior extraperitoneal pelvis Possible cystitis Right upper lobe pulmonary nodule BPH  Hypertension  Hyperlipidemia AKI on CKD T2DM  Acute bloody loss Anemia Hearing loss   Consultants Orthopedic Surgery Dr. Jena Gauss  Procedures Dr. Jena Gauss, 10/27/23 CPT 27216-Percutaneous fixation of posterior pelvis CPT 27217-Percutaneous fixation of anterior pelvis  HPI:  Patient is an 89 year old male who presented to [facility] s/p fall from roof over a well house. Fell approximately 6 ft. He reported he was sawing a piece of rotten wood off the roof when he lost his balance and jumped to avoid the scaffolding next the the roof landing on his left side. Did not recall hitting his head. He reports pain with deep inspiration but denies SOB. He reports severe pain with trying to put weight on his LLE. PMH significant for HTN, HLD, BPH, T2DM and previous left hip replacement. Not on blood thinners. NKDA. Reported rare alcohol use and is a former but not current smoker, denied illicit drug use. 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   Hospital Course:   Patient was admitted to the trauma service for further evaluation and treatment as below:   Left 2-6 rib fractures - multimodal pain control during admission with pulmonary toilet and IS. He had good respiratory function on room air at  time of discharge Left superior and inferior pubic rami fractures - orthopedic surgery consulted. He underwent percutaneous fixation by Dr. Jena Gauss.  Left sacral ala fracture with hemorrhage in pre-sacral space, left pelvic sidewall and anterior extraperitoneal pelvis - orthopedic surgery consulted and he underwent percutaneous fixation 3/13 by Dr. Jena Gauss. He has restrictions of WBAT RLE and WB for transfers LLE. Orthopedics recommends ASA 325 mg daily for 30 days for DVT prophylaxis and follow up in 2 weeks Possible cystitis - seen on imaging. UA not suggestive of UTI and asymptomatic during admission Right upper lobe pulmonary nodule - recommend outpatient follow up. Discussed with patient and daughter BPH - home meds during admission Hypertension - home meds and monitored BP during admission Hyperlipidemia AKI on CKD - IV fluids given and creatinine and UOP monitored. Creatinine improved and was 1.4 prior to discharge with baseline around 1.3. Recommend PCP follow T2DM - glucose monitored and home meds resumed during admission Acute bloody loss Anemia - secondary to the above. Hemoglobin 7.0 on 3/17. 1 unit pRBC given 3/17. Hemoglobin improved to 8.3. Started Iron and Vit C during admission Hearing loss - recommend outpatient audiology follow up  On date of discharge patient had appropriately progressed with therapies and met criteria for safe discharge to SNF with the support of his children.  I discussed discharge instructions with patient as well as return precautions and all questions and concerns were addressed.   I or a member of my team have reviewed this patient in the Controlled Substance Database.  Patient agrees to follow up as below  Allergies as of 11/01/2023   No Known Allergies      Medication  List     PAUSE taking these medications    aspirin EC 81 MG tablet Wait to take this until: December 01, 2023 Take 1 tablet (81 mg total) by mouth daily. Swallow whole. You also  have another medication with the same name that you may need to continue taking.       STOP taking these medications    Vitamin D3 25 MCG (1000 UT) Caps       TAKE these medications    acetaminophen 500 MG tablet Commonly known as: TYLENOL Take 2 tablets (1,000 mg total) by mouth every 8 (eight) hours as needed for mild pain (pain score 1-3) or moderate pain (pain score 4-6).   amLODipine 5 MG tablet Commonly known as: NORVASC Take 1 tablet (5 mg total) by mouth daily.   ascorbic acid 500 MG tablet Commonly known as: VITAMIN C Take 1 tablet (500 mg total) by mouth 2 (two) times daily.   aspirin EC 325 MG tablet Take 1 tablet (325 mg total) by mouth daily. What changed: Another medication with the same name was paused. Ask your nurse or doctor if you should take this medication.   cyanocobalamin 1000 MCG tablet Commonly known as: VITAMIN B12 Take 1 tablet (1,000 mcg total) by mouth daily.   docusate sodium 100 MG capsule Commonly known as: COLACE Take 100 mg by mouth daily. OTC   ferrous sulfate 325 (65 FE) MG tablet Take 1 tablet (325 mg total) by mouth 2 (two) times daily with a meal.   glipiZIDE 5 MG 24 hr tablet Commonly known as: GLUCOTROL XL Take 1 tablet (5 mg total) by mouth daily with breakfast.   losartan 50 MG tablet Commonly known as: COZAAR Take 1 tablet (50 mg total) by mouth daily.   metFORMIN 500 MG tablet Commonly known as: GLUCOPHAGE Take 1 tablet (500 mg total) by mouth 2 (two) times daily with a meal.   methocarbamol 500 MG tablet Commonly known as: ROBAXIN Take 1 tablet (500 mg total) by mouth every 8 (eight) hours as needed for up to 5 days for muscle spasms.   polyethylene glycol powder 17 GM/SCOOP powder Commonly known as: GLYCOLAX/MIRALAX Take 8.5-17 g by mouth daily as needed for moderate constipation. (1/2-1 capful)   pravastatin 80 MG tablet Commonly known as: PRAVACHOL Take 1 tablet (80 mg total) by mouth daily.   tamsulosin  0.4 MG Caps capsule Commonly known as: FLOMAX Take 1 capsule (0.4 mg total) by mouth daily.   Vitamin D (Ergocalciferol) 1.25 MG (50000 UNIT) Caps capsule Commonly known as: DRISDOL Take 1 capsule (50,000 Units total) by mouth every 7 (seven) days. Start taking on: November 05, 2023          Follow-up Information     Eustaquio Boyden, MD. Call.   Specialty: Family Medicine Why: follow up with PCP after recent admission and to follow lung nodule noted on imaging during admision Contact information: [address] [place] [address] [phone]         Haddix, Gillie Manners, MD. Call.   Specialty: Orthopedic Surgery Why: to follow up pelvic fractures Contact information: [address] [place] [address] [phone]         [facility]. Call.   Why: As needed Contact information: [address] [place] [address] [phone]                Signed: Eric Form , [facility] 11/01/2023, 2:25 PM Please see Amion for pager number during day hours 7:00am-4:30pm

## 2023-11-01 NOTE — Progress Notes (Signed)
 Occupational Therapy Treatment Patient Details Name: John Sanford MRN: [id number] DOB: Feb 20, 1933 Today's Date: 11/01/2023   History of present illness Pt is an 89 y.o. male admitted into ED on 10/25/23 for fall off roof. CT & x-rays showed L sacral & pubic rami fx, L 2-6 rib fx. Closed reduction of pelvis performed 3/13. RLE WBAT, LLE WB for transfers only. PMH: arthrisits, skin cancer, DM, HTN, pneumonia, L THA   OT comments  Pt progressing well towards goals. States he is in no pain. STS requiring less assistance, but decreased balance and ROM continues to limit pt. Recommendation of <3 hours of skilled rehab daily to optimize independence levels. Will continue to follow acutely.      If plan is discharge home, recommend the following:  A little help with walking and/or transfers;A little help with bathing/dressing/bathroom;Assistance with cooking/housework;Help with stairs or ramp for entrance   Equipment Recommendations  Other (comment) (Defer to next venue)    Recommendations for Other Services      Precautions / Restrictions Precautions Precautions: Fall Recall of Precautions/Restrictions: Intact Restrictions Weight Bearing Restrictions Per Provider Order: Yes RLE Weight Bearing Per Provider Order: Weight bearing as tolerated LLE Weight Bearing Per Provider Order: Weight bearing as tolerated Other Position/Activity Restrictions: LLE WB for transfers only       Mobility Bed Mobility Overal bed mobility: Needs Assistance Bed Mobility: Sit to Supine       Sit to supine: Min assist   General bed mobility comments: assist for LE translation to/from EOB, able to use RUE to pull up in bed    Transfers Overall transfer level: Needs assistance Equipment used: Rolling walker (2 wheels) Transfers: Sit to/from Stand, Bed to chair/wheelchair/BSC Sit to Stand: Min assist Stand pivot transfers: Contact guard assist         General transfer comment: Min assist to  lift     Balance Overall balance assessment: Needs assistance Sitting-balance support: Bilateral upper extremity supported, Feet supported Sitting balance-Leahy Scale: Good     Standing balance support: Bilateral upper extremity supported Standing balance-Leahy Scale: Fair Standing balance comment: Reliant on RW                           ADL either performed or assessed with clinical judgement   ADL Overall ADL's : Needs assistance/impaired                     Lower Body Dressing: Moderate assistance Lower Body Dressing Details (indicate cue type and reason): Unable to reach socks Toilet Transfer: Contact guard assist;Cueing for sequencing;Stand-pivot;Rolling walker (2 wheels) (Urinal) Toilet Transfer Details (indicate cue type and reason): Cues for safety while using urinal Toileting- Clothing Manipulation and Hygiene: Contact guard assist;Sit to/from stand Toileting - Clothing Manipulation Details (indicate cue type and reason): Wipe while standing            Extremity/Trunk Assessment Upper Extremity Assessment Upper Extremity Assessment: Overall WFL for tasks assessed   Lower Extremity Assessment Lower Extremity Assessment: Defer to PT evaluation        Vision       Perception     Praxis     Communication Communication Communication: No apparent difficulties Factors Affecting Communication: Hearing impaired   Cognition Arousal: Alert Behavior During Therapy: WFL for tasks assessed/performed Cognition: No apparent impairments  Following commands: Intact        Cueing   Cueing Techniques: Verbal cues, Tactile cues, Visual cues  Exercises      Shoulder Instructions       General Comments Urine voided    Pertinent Vitals/ Pain       Pain Assessment Pain Assessment: No/denies pain  Home Living Family/patient expects to be discharged to:: Private residence                                         Prior Functioning/Environment              Frequency  Min 2X/week        Progress Toward Goals  OT Goals(current goals can now be found in the care plan section)  Progress towards OT goals: Progressing toward goals  Acute Rehab OT Goals Patient Stated Goal: To move better OT Goal Formulation: With patient Time For Goal Achievement: 11/11/23 Potential to Achieve Goals: Good ADL Goals Pt Will Perform Lower Body Dressing: with supervision;sit to/from stand Pt Will Transfer to Toilet: stand pivot transfer;bedside commode;with modified independence Additional ADL Goal #1: Pt will complete bed mobility with CGA to prepare for functional transfer  Plan      Co-evaluation                 AM-PAC OT "6 Clicks" Daily Activity     Outcome Measure   Help from another person eating meals?: None Help from another person taking care of personal grooming?: A Little Help from another person toileting, which includes using toliet, bedpan, or urinal?: A Little Help from another person bathing (including washing, rinsing, drying)?: A Lot Help from another person to put on and taking off regular upper body clothing?: A Little Help from another person to put on and taking off regular lower body clothing?: A Lot 6 Click Score: 17    End of Session Equipment Utilized During Treatment: Gait belt;Rolling walker (2 wheels)  OT Visit Diagnosis: Unsteadiness on feet (R26.81);Other abnormalities of gait and mobility (R26.89);Repeated falls (R29.6)   Activity Tolerance Patient tolerated treatment well   Patient Left in bed;with call bell/phone within reach;with bed alarm set;with SCD's reapplied   Nurse Communication Mobility status        Time: 1137-1201 OT Time Calculation (min): 24 min  Charges: OT General Charges $OT Visit: 1 Visit OT Treatments $Self Care/Home Management : 23-37 mins  Ivor Messier, OT  Acute Rehabilitation Services Office  [phone] Secure chat preferred   Marilynne Drivers 11/01/2023, 12:22 PM

## 2023-11-01 NOTE — Progress Notes (Signed)
 Mobility Specialist Progress Note:    11/01/23 1400  Mobility  Activity Stood at bedside  Level of Assistance Contact guard assist, steadying assist  Assistive Device Front wheel walker  RLE Weight Bearing Per Provider Order WBAT  LLE Weight Bearing Per Provider Order WBAT  Activity Response Tolerated well  Mobility visit 1 Mobility  Mobility Specialist Start Time (ACUTE ONLY) 1429  Mobility Specialist Stop Time (ACUTE ONLY) 1437  Mobility Specialist Time Calculation (min) (ACUTE ONLY) 8 min   Pt received in bed, requesting assistance to stand and use urinal. Required minA to come EOB and CG to stand. No complaints throughout. Pt left on EOB w/ family and PTA present.  [name] Mobility Specialist Please contact via Special educational needs teacher or Rehab office at [phone]

## 2023-11-01 NOTE — Progress Notes (Addendum)
 Progress Note  5 Days Post-Op  Subjective: Pain well controlled. no shortness of breath. He noticed some scrotal swelling yesterday. No pain  Objective: Vital signs in last 24 hours: Temp:  [97.7 F (36.5 C)-98.5 F (36.9 C)] 97.7 F (36.5 C) (03/17 2019) Pulse Rate:  [74-84] 84 (03/17 2019) Resp:  [16-18] 17 (03/17 2019) BP: (128-141)/(53-63) 139/59 (03/17 2019) SpO2:  [94 %-100 %] 98 % (03/17 2019) Last BM Date : 10/31/23  Intake/Output from previous day: 03/17 0701 - 03/18 0700 In: 435 [P.O.:120; Blood:315] Out: 750 [Urine:750] Intake/Output this shift: No intake/output data recorded.  PE: General: pleasant, WD, male sitting in his chair Lungs: CTAB, no wheezes, rhonchi, or rales noted.  Respiratory effort nonlabored on room air. Pulls 1250 on IS Abd: soft, NT, ND. Suprapubic incision with dressing cdi MSK: all 4 extremities are symmetrical with no cyanosis, clubbing, or edema. Skin: warm and dry Psych: A&Ox3 with an appropriate affect.    Lab Results:  Recent Labs    10/29/23 0746 10/31/23 0625 10/31/23 2034  WBC 8.7 7.6  --   HGB 7.2* 7.0* 8.8*  HCT 20.9* 20.6* 25.4*  PLT 132* 187  --    BMET Recent Labs    10/29/23 0746  NA 136  K 4.3  CL 106  CO2 23  GLUCOSE 165*  BUN 24*  CREATININE 1.44*  CALCIUM 7.8*   PT/INR No results for input(s): "LABPROT", "INR" in the last 72 hours. CMP     Component Value Date/Time   NA 136 10/29/2023 0746   K 4.3 10/29/2023 0746   CL 106 10/29/2023 0746   CO2 23 10/29/2023 0746   GLUCOSE 165 (H) 10/29/2023 0746   BUN 24 (H) 10/29/2023 0746   CREATININE 1.44 (H) 10/29/2023 0746   CALCIUM 7.8 (L) 10/29/2023 0746   PROT 7.4 04/29/2023 1014   ALBUMIN 4.1 04/29/2023 1014   AST 20 04/29/2023 1014   ALT 18 04/29/2023 1014   ALKPHOS 56 04/29/2023 1014   BILITOT 0.6 04/29/2023 1014   GFRNONAA 47 (L) 10/29/2023 0746   GFRAA >60 04/03/2020 1433   Lipase  No results found for:  "LIPASE"     Studies/Results: No results found.   Anti-infectives: Anti-infectives (From admission, onward)    Start     Dose/Rate Route Frequency Ordered Stop   10/27/23 1800  ceFAZolin (ANCEF) IVPB 2g/100 mL premix        2 g 200 mL/hr over 30 Minutes Intravenous Every 12 hours 10/27/23 1218 10/28/23 0657   10/27/23 0900  ceFAZolin (ANCEF) IVPB 2g/100 mL premix        2 g 200 mL/hr over 30 Minutes Intravenous On call to O.R. 10/27/23 0801 10/27/23 1015   10/27/23 0823  ceFAZolin (ANCEF) 2-4 GM/100ML-% IVPB       Note to Pharmacy: Garen Lah E: cabinet override      10/27/23 0823 10/27/23 0951        Assessment/Plan Fall off roof L2-6 rib fractures - multimodal pain control, IS, pulm toilet L superior and inferior pubic rami fractures - per orthopedic surgery. S/p perc fixation today by Dr. Jena Gauss.  Left sacral ala fracture with hemorrhage in pre-sacral space, left pelvic sidewall and anterior extraperitoneal pelvis - per orthopedic surgery, s/p perc fixation 3/13 by Dr. Jena Gauss. WBAT RLE. WB for transfers LLE Possible cystitis - UA not suggestive of UTI RUL pulmonary nodule - recommend outpatient follow up. Discussed with patient and daughter BPH - home meds HTN - home  meds and monitor BP HLD AKI on CKD - Cr improved, 1.4 last check and baseline is around 1.3 T2DM - ok to have glipizide, resume metformin ABL Anemia - hgb 7.0 3/17. 1 unit pRBC 3/17. Hgb improved to 8.3. Started Iron and Vit C  Hearing loss - recommend outpatient audiology follow up  FEN: HH diet, SLIV VTE: LMWH ID: periop [email or web address]   Dispo: medically stable for SNF  I reviewed [facility] notes, last 24 h vitals and pain scores, last 48 h intake and output, last 24 h labs and trends, and last 24 h imaging results.    LOS: 6 days   Eric Form, [facility] 11/01/2023, 7:44 AM Please see Amion for pager number during day hours 7:00am-4:30pm

## 2023-11-01 NOTE — Progress Notes (Signed)
 Physical Therapy Treatment Patient Details Name: John Sanford MRN: [id number] DOB: 1932/12/19 Today's Date: 11/01/2023   History of Present Illness Pt is an 89 y.o. male admitted into ED on 10/25/23 for fall off roof. CT & x-rays showed L sacral & pubic rami fx, L 2-6 rib fx. Closed reduction of pelvis performed 3/13. RLE WBAT, LLE WB for transfers only. PMH: arthrisits, skin cancer, DM, HTN, pneumonia, L THA.    PT Comments  Pt received seated EOB, agreeable to therapy session and with good participation and tolerance for transfer training with RW. Pt needing reminder on BLE weight bearing precs, but often letting go of RW upon standing despite cues for allowing PTA to assist him with static standing tasks so he can keep pressure off his pelvis using RW. Pt with only minimal c/o pain while pivoting to chair, then chair<>toilet using wall rail support. Pt and daughter instructed on safe techniques during transfers and pivoting using RW to decrease pressure over LLE but pt will need reinforcement, also due to hard of hearing status. RN/MS notified of safe technique for return pivot transfer to bed from chair later in day, daughter in room and agreeable to notify staff when he needs to get up due to pt's high fall risk. Patient will benefit from continued inpatient follow up therapy, <3 hours/day     If plan is discharge home, recommend the following: Help with stairs or ramp for entrance;Assist for transportation;A lot of help with bathing/dressing/bathroom;Assistance with cooking/housework;A little help with bathing/dressing/bathroom;A little help with walking and/or transfers   Can travel by private vehicle     No  Equipment Recommendations  Rolling walker (2 wheels)    Recommendations for Other Services       Precautions / Restrictions Precautions Precautions: Fall Recall of Precautions/Restrictions: Intact Restrictions Weight Bearing Restrictions Per Provider Order: Yes RLE  Weight Bearing Per Provider Order: Weight bearing as tolerated LLE Weight Bearing Per Provider Order: Weight bearing as tolerated Other Position/Activity Restrictions: LLE WB for transfers only     Mobility  Bed Mobility Overal bed mobility: Needs Assistance             General bed mobility comments: pt received at EOB and agreeable to sit up in recliner at end of session.    Transfers Overall transfer level: Needs assistance Equipment used: Rolling walker (2 wheels) Transfers: Sit to/from Stand, Bed to chair/wheelchair/BSC Sit to Stand: Min assist   Step pivot transfers: Contact guard assist       General transfer comment: EOB>RW and RW>chair, chair<>low toilet.    Ambulation/Gait               General Gait Details: NT, orders for transfers only   Stairs             Wheelchair Mobility     Tilt Bed    Modified Rankin (Stroke Patients Only)       Balance Overall balance assessment: Needs assistance Sitting-balance support: Bilateral upper extremity supported, Feet supported Sitting balance-Leahy Scale: Good     Standing balance support: Bilateral upper extremity supported Standing balance-Leahy Scale: Poor Standing balance comment: Reliant on RW, CGA when standing for peri-care after toileting and pt lets go of RW, pt cued to allow PTA to assist with clothing adjustment due to precs/type of injury to reduce pressure on his pelvis but pt ignoring cues.  Communication Communication Communication: No apparent difficulties Factors Affecting Communication: Hearing impaired  Cognition Arousal: Alert Behavior During Therapy: WFL for tasks assessed/performed   PT - Cognitive impairments: No apparent impairments                       PT - Cognition Comments: tangential Following commands: Intact      Cueing Cueing Techniques: Verbal cues, Tactile cues, Visual cues  Exercises Total Joint  Exercises Ankle Circles/Pumps: AROM, Both, 10 reps, Supine Quad Sets: AROM, Both, Supine, 5 reps Other Exercises Other Exercises: IS x 10 reps pt achieves ~1500 mL cues for technique    General Comments General comments (skin integrity, edema, etc.): urine voided, pt attempted BM but states he just passed gas. Pt needs safety cues but able to perform his own peri-care with assist to maintain balance. geomat cushion under him in chair, daughter notified he can take it with him to SNF for comfort in chairs.      Pertinent Vitals/Pain Pain Assessment Pain Assessment: Faces Faces Pain Scale: Hurts a little bit Pain Location: soreness L ribs and groin Pain Descriptors / Indicators: Grimacing Pain Intervention(s): Monitored during session, Repositioned (no c/o pain.)    Home Living Family/patient expects to be discharged to:: Private residence                        Prior Function            PT Goals (current goals can now be found in the care plan section) Acute Rehab PT Goals Patient Stated Goal: To go to rehab PT Goal Formulation: With patient/family Time For Goal Achievement: 11/11/23 Progress towards PT goals: Progressing toward goals    Frequency    Min 3X/week      PT Plan      Co-evaluation              AM-PAC PT "6 Clicks" Mobility   Outcome Measure  Help needed turning from your back to your side while in a flat bed without using bedrails?: A Little Help needed moving from lying on your back to sitting on the side of a flat bed without using bedrails?: A Little Help needed moving to and from a bed to a chair (including a wheelchair)?: A Little Help needed standing up from a chair using your arms (e.g., wheelchair or bedside chair)?: A Little Help needed to walk in hospital room?: Total Help needed climbing 3-5 steps with a railing? : Total 6 Click Score: 14    End of Session Equipment Utilized During Treatment: Gait belt Activity Tolerance:  Patient tolerated treatment well Patient left: in chair;with call bell/phone within reach;with family/visitor present (daughter in room until he goes to SNF today so kept chair alarm off; RN aware) Nurse Communication: Mobility status;Weight bearing status;Other (comment) (safe technique for return transfer from chair>bed) PT Visit Diagnosis: Difficulty in walking, not elsewhere classified (R26.2);Pain Pain - part of body: Hip     Time: 4098-1191 PT Time Calculation (min) (ACUTE ONLY): 35 min  Charges:    $Therapeutic Exercise: 8-22 mins $Therapeutic Activity: 8-22 mins PT General Charges $$ ACUTE PT VISIT: 1 Visit                     Isel Skufca P., PTA Acute Rehabilitation Services Secure Chat Preferred 9a-5:30pm Office: [phone]    Dorathy Kinsman [facility] 11/01/2023, 3:36 PM

## 2023-11-01 NOTE — Progress Notes (Signed)
 AVS completed; copy placed at nurses station.

## 2023-11-01 NOTE — Progress Notes (Signed)
 Report called to Ashlyn at [facility].

## 2023-11-06 DIAGNOSIS — E785 Hyperlipidemia, unspecified: Secondary | ICD-10-CM | POA: Diagnosis not present

## 2023-11-06 DIAGNOSIS — E46 Unspecified protein-calorie malnutrition: Secondary | ICD-10-CM | POA: Diagnosis not present

## 2023-11-06 DIAGNOSIS — S32110A Nondisplaced Zone I fracture of sacrum, initial encounter for closed fracture: Secondary | ICD-10-CM | POA: Diagnosis not present

## 2023-11-06 DIAGNOSIS — E1151 Type 2 diabetes mellitus with diabetic peripheral angiopathy without gangrene: Secondary | ICD-10-CM | POA: Diagnosis not present

## 2023-11-06 DIAGNOSIS — S32592A Other specified fracture of left pubis, initial encounter for closed fracture: Secondary | ICD-10-CM | POA: Diagnosis not present

## 2023-11-06 DIAGNOSIS — I10 Essential (primary) hypertension: Secondary | ICD-10-CM | POA: Diagnosis not present

## 2023-11-06 DIAGNOSIS — K5900 Constipation, unspecified: Secondary | ICD-10-CM | POA: Diagnosis not present

## 2023-11-06 DIAGNOSIS — S32512A Fracture of superior rim of left pubis, initial encounter for closed fracture: Secondary | ICD-10-CM | POA: Diagnosis not present

## 2023-11-15 DIAGNOSIS — S32811D Multiple fractures of pelvis with unstable disruption of pelvic ring, subsequent encounter for fracture with routine healing: Secondary | ICD-10-CM | POA: Diagnosis not present

## 2023-12-06 ENCOUNTER — Telehealth: Payer: Self-pay

## 2023-12-06 NOTE — Telephone Encounter (Signed)
 Copied from CRM [id number]. Topic: Clinical - Home Health Verbal Orders >> Dec 06, 2023 11:45 AM Chuck Crater wrote: Caller/Agency: [facility] Callback Number: [phone] Service Requested: Physical Therapy Frequency: N/A Any new concerns about the patient? No, wants to let the doctor know patient didn't qualify for home health services because he can walk more than 1000 feet, can take showers, use bathroom, put on clothes and down steps daily with walker with no help and no pain. He recommend outpatient physical therapy.

## 2023-12-07 NOTE — Telephone Encounter (Signed)
 Spoke with pt relaying Dr Ocie Belt message. Pt verbalizes understanding but wants to wait and discuss things with Dr Crissie Dome first. Fyi to Dr Crissie Dome.

## 2023-12-07 NOTE — Telephone Encounter (Signed)
 Noted recent rib and pelvic fracture after fall off his roof.   Check with pt - if willing for outpatient PT I can place referral  Otherwise we can review at [facility] next week.

## 2023-12-12 ENCOUNTER — Encounter: Payer: Self-pay | Admitting: Family Medicine

## 2023-12-12 ENCOUNTER — Ambulatory Visit (INDEPENDENT_AMBULATORY_CARE_PROVIDER_SITE_OTHER): Admitting: Family Medicine

## 2023-12-12 VITALS — BP 130/64 | HR 74 | Temp 97.8°F | Ht 66.0 in | Wt 169.1 lb

## 2023-12-12 DIAGNOSIS — S3282XA Multiple fractures of pelvis without disruption of pelvic ring, initial encounter for closed fracture: Secondary | ICD-10-CM | POA: Diagnosis not present

## 2023-12-12 DIAGNOSIS — E1122 Type 2 diabetes mellitus with diabetic chronic kidney disease: Secondary | ICD-10-CM

## 2023-12-12 DIAGNOSIS — Z7984 Long term (current) use of oral hypoglycemic drugs: Secondary | ICD-10-CM

## 2023-12-12 DIAGNOSIS — H9193 Unspecified hearing loss, bilateral: Secondary | ICD-10-CM | POA: Diagnosis not present

## 2023-12-12 DIAGNOSIS — E1149 Type 2 diabetes mellitus with other diabetic neurological complication: Secondary | ICD-10-CM

## 2023-12-12 DIAGNOSIS — S2242XA Multiple fractures of ribs, left side, initial encounter for closed fracture: Secondary | ICD-10-CM | POA: Diagnosis not present

## 2023-12-12 DIAGNOSIS — W19XXXA Unspecified fall, initial encounter: Secondary | ICD-10-CM | POA: Diagnosis not present

## 2023-12-12 DIAGNOSIS — N183 Chronic kidney disease, stage 3 unspecified: Secondary | ICD-10-CM | POA: Diagnosis not present

## 2023-12-12 DIAGNOSIS — K5909 Other constipation: Secondary | ICD-10-CM | POA: Diagnosis not present

## 2023-12-12 LAB — CBC WITH DIFFERENTIAL/PLATELET
Basophils Absolute: 0 10*3/uL (ref 0.0–0.1)
Basophils Relative: 0.6 % (ref 0.0–3.0)
Eosinophils Absolute: 0.2 10*3/uL (ref 0.0–0.7)
Eosinophils Relative: 2.4 % (ref 0.0–5.0)
HCT: 38.9 % — ABNORMAL LOW (ref 39.0–52.0)
Hemoglobin: 12.5 g/dL — ABNORMAL LOW (ref 13.0–17.0)
Lymphocytes Relative: 25.9 % (ref 12.0–46.0)
Lymphs Abs: 2.3 10*3/uL (ref 0.7–4.0)
MCHC: 32.2 g/dL (ref 30.0–36.0)
MCV: 96.7 fl (ref 78.0–100.0)
Monocytes Absolute: 0.7 10*3/uL (ref 0.1–1.0)
Monocytes Relative: 7.7 % (ref 3.0–12.0)
Neutro Abs: 5.7 10*3/uL (ref 1.4–7.7)
Neutrophils Relative %: 63.4 % (ref 43.0–77.0)
Platelets: 278 10*3/uL (ref 150.0–400.0)
RBC: 4.03 Mil/uL — ABNORMAL LOW (ref 4.22–5.81)
RDW: 14.7 % (ref 11.5–15.5)
WBC: 8.9 10*3/uL (ref 4.0–10.5)

## 2023-12-12 LAB — RENAL FUNCTION PANEL
Albumin: 4.2 g/dL (ref 3.5–5.2)
BUN: 32 mg/dL — ABNORMAL HIGH (ref 6–23)
CO2: 26 meq/L (ref 19–32)
Calcium: 9.4 mg/dL (ref 8.4–10.5)
Chloride: 103 meq/L (ref 96–112)
Creatinine, Ser: 1.34 mg/dL (ref 0.40–1.50)
GFR: 47.36 mL/min — ABNORMAL LOW (ref >60.00)
Glucose, Bld: 112 mg/dL — ABNORMAL HIGH (ref 70–99)
Phosphorus: 3.4 mg/dL (ref 2.3–4.6)
Potassium: 5.4 meq/L — ABNORMAL HIGH (ref 3.5–5.1)
Sodium: 137 meq/L (ref 135–145)

## 2023-12-12 LAB — HEMOGLOBIN A1C: Hgb A1c MFr Bld: 6.2 % (ref 4.6–6.5)

## 2023-12-12 NOTE — Patient Instructions (Addendum)
 Discuss outpatient physical therapy with Dr Curtiss Dowdy.  We will refer you to hearing doctor for evaluation.  Labs today.  Good to see you today  Increase water  intake.  May use glucerna supplement.  Increase fruit and water  in diet.

## 2023-12-12 NOTE — Progress Notes (Unsigned)
 Ph: [phone] Fax: [phone]   Patient ID: John Sanford, male    DOB: Dec 21, 1932, 89 y.o.   MRN: [id number]  This visit was conducted in person.  BP 130/64   Pulse 74   Temp 97.8 F (36.6 C) (Oral)   Ht 5\' 6"  (1.676 m)   Wt 169 lb 2 oz (76.7 kg)   SpO2 96%   BMI 27.30 kg/m    CC: hosp f/u visit  Subjective:   HPI: John Sanford is a 89 y.o. male presenting on 12/12/2023 for Hospitalization Follow-up (Admitted on 10/25/23 at [facility], dx closed fx of multiple ribs L side; close fx of pelvis; AKI; hyperkalemia. Also, requests hearing referral. Pt accompanied by daughter, Hospital doctor. Amber asks that Dr Crissie Dome encourage pt to drink more water . )   Recent hospitalization for fall from roof landing on L side with resultant L superior and inferior pubic rami and sacral ala pelvic fractures as well as L 2nd - 6th rib fractures. This was complicated by presacral space hemorrhage. Treated with percutaneous fixation (Dr Curtiss Dowdy).  Hospital records reviewed. Med rec performed.  Recommended aspirin  325mg  daily x 30 days for DVT ppx.  Acute blood loss anemia treated with transfusion 1 u pRBC 3/17. Discharge Hgb up to 8.3. started on oral iron and vit C replacement. Vitamin D  was discontinued.   Hearing loss - agrees to audiology referral.   Some concern over worsening memory since accident.  Daughter requests we contact her or her brother to schedule appointments [phone]) [phone] Catering manager).  They are now using weekly pill bottle.   Lab Results  Component Value Date   HGBA1C 5.7 (A) 07/05/2023    Home health  set up with [facility] - did not qualify for home health PT - rec outpatient PT - he will discuss this with ortho tomorrow.  Other follow up appointments scheduled: ortho appt tomorrow with Dr Curtiss Dowdy ______________________________________________________________________ Hospital admission: 10/25/2023 Hospital discharge: 11/01/2023 Discharged to [facility] 11/01/2023 through 12/03/2023 -  came home to daughter's house.  TCM f/u phone call: not performed   Discharge Diagnoses Left 2-6 rib fractures  Left superior and inferior pubic rami fractures  Left sacral ala fracture with hemorrhage in pre-sacral space, left pelvic sidewall and anterior extraperitoneal pelvis Possible cystitis Right upper lobe pulmonary nodule BPH  Hypertension  Hyperlipidemia AKI on CKD T2DM  Acute bloody loss Anemia Hearing loss      Relevant past medical, surgical, family and social history reviewed and updated as indicated. Interim medical history since our last visit reviewed. Allergies and medications reviewed and updated. Outpatient Medications Prior to Visit  Medication Sig Dispense Refill   acetaminophen  (TYLENOL ) 500 MG tablet Take 2 tablets (1,000 mg total) by mouth every 8 (eight) hours as needed for mild pain (pain score 1-3) or moderate pain (pain score 4-6).     amLODipine  (NORVASC ) 5 MG tablet Take 1 tablet (5 mg total) by mouth daily. 90 tablet 4   ascorbic acid  (VITAMIN C ) 500 MG tablet Take 1 tablet (500 mg total) by mouth 2 (two) times daily.     aspirin  EC 325 MG tablet Take 325 mg by mouth daily.     cyanocobalamin  (VITAMIN B12) 1000 MCG tablet Take 1 tablet (1,000 mcg total) by mouth daily.     Docusate Calcium (STOOL SOFTENER PO) Take by mouth as needed.     ferrous sulfate  325 (65 FE) MG tablet Take 1 tablet (325 mg total) by mouth 2 (two) times  daily with a meal.     glipiZIDE  (GLUCOTROL  XL) 5 MG 24 hr tablet Take 1 tablet (5 mg total) by mouth daily with breakfast. 90 tablet 4   loratadine (CLARITIN) 10 MG tablet Take 10 mg by mouth daily.     losartan  (COZAAR ) 50 MG tablet Take 1 tablet (50 mg total) by mouth daily. 90 tablet 4   metFORMIN  (GLUCOPHAGE ) 500 MG tablet Take 1 tablet (500 mg total) by mouth 2 (two) times daily with a meal. 180 tablet 4   polyethylene glycol powder (GLYCOLAX /MIRALAX ) 17 GM/SCOOP powder Take 8.5-17 g by mouth daily as needed for moderate  constipation. (1/2-1 capful) (Patient taking differently: Take 8.5-17 g by mouth daily as needed for moderate constipation. (1/2-1 capful) Takes daily) 3350 g 1   pravastatin  (PRAVACHOL ) 80 MG tablet Take 1 tablet (80 mg total) by mouth daily. 90 tablet 4   tamsulosin  (FLOMAX ) 0.4 MG CAPS capsule Take 1 capsule (0.4 mg total) by mouth daily. 90 capsule 4   Vitamin D , Ergocalciferol , (DRISDOL ) 1.25 MG (50000 UNIT) CAPS capsule Take 1 capsule (50,000 Units total) by mouth every 7 (seven) days.     aspirin  EC 81 MG tablet Take 1 tablet (81 mg total) by mouth daily. Swallow whole. 30 tablet 12   docusate sodium  (COLACE) 100 MG capsule Take 100 mg by mouth daily. OTC     No facility-administered medications prior to visit.     Per HPI unless specifically indicated in ROS section below Review of Systems  Objective:  BP 130/64   Pulse 74   Temp 97.8 F (36.6 C) (Oral)   Ht 5\' 6"  (1.676 m)   Wt 169 lb 2 oz (76.7 kg)   SpO2 96%   BMI 27.30 kg/m   Wt Readings from Last 3 Encounters:  12/12/23 169 lb 2 oz (76.7 kg)  10/27/23 163 lb (73.9 kg)  07/05/23 163 lb (73.9 kg)      Physical Exam Vitals and nursing note reviewed.  Constitutional:      Appearance: Normal appearance. He is not ill-appearing.     Comments: Ambulates with walker  HENT:     Head: Normocephalic and atraumatic.     Ears:     Comments: Cerumen in canals, L>R    Mouth/Throat:     Mouth: Mucous membranes are moist.     Pharynx: Oropharynx is clear. No oropharyngeal exudate or posterior oropharyngeal erythema.  Eyes:     Extraocular Movements: Extraocular movements intact.     Conjunctiva/sclera: Conjunctivae normal.     Pupils: Pupils are equal, round, and reactive to light.  Cardiovascular:     Rate and Rhythm: Normal rate and regular rhythm.     Pulses: Normal pulses.     Heart sounds: Normal heart sounds. No murmur heard. Pulmonary:     Effort: Pulmonary effort is normal. No respiratory distress.     Breath  sounds: Normal breath sounds. No wheezing, rhonchi or rales.  Musculoskeletal:     Cervical back: Normal range of motion and neck supple.     Right lower leg: No edema.     Left lower leg: No edema.  Lymphadenopathy:     Cervical: No cervical adenopathy.  Skin:    General: Skin is warm and dry.     Findings: No rash.  Neurological:     Mental Status: He is alert.  Psychiatric:        Mood and Affect: Mood normal.  Behavior: Behavior normal.       Results for orders placed or performed during the hospital encounter of 10/25/23  Potassium   Collection Time: 10/25/23 12:05 AM  Result Value Ref Range   Potassium 5.6 (H) 3.5 - 5.1 mmol/L  CBC with Differential/Platelet   Collection Time: 10/25/23 10:27 PM  Result Value Ref Range   WBC 17.9 (H) 4.0 - 10.5 K/uL   RBC 3.64 (L) 4.22 - 5.81 MIL/uL   Hemoglobin 11.6 (L) 13.0 - 17.0 g/dL   HCT 96.0 (L) 45.4 - 09.8 %   MCV 94.0 80.0 - 100.0 fL   MCH 31.9 26.0 - 34.0 pg   MCHC 33.9 30.0 - 36.0 g/dL   RDW 11.9 14.7 - 82.9 %   Platelets 156 150 - 400 K/uL   nRBC 0.0 0.0 - 0.2 %   Neutrophils Relative % 85 %   Neutro Abs 15.1 (H) 1.7 - 7.7 K/uL   Lymphocytes Relative 6 %   Lymphs Abs 1.0 0.7 - 4.0 K/uL   Monocytes Relative 8 %   Monocytes Absolute 1.4 (H) 0.1 - 1.0 K/uL   Eosinophils Relative 0 %   Eosinophils Absolute 0.0 0.0 - 0.5 K/uL   Basophils Relative 0 %   Basophils Absolute 0.0 0.0 - 0.1 K/uL   Immature Granulocytes 1 %   Abs Immature Granulocytes 0.13 (H) 0.00 - 0.07 K/uL  Basic metabolic panel   Collection Time: 10/25/23 10:27 PM  Result Value Ref Range   Sodium 137 135 - 145 mmol/L   Potassium 6.1 (H) 3.5 - 5.1 mmol/L   Chloride 104 98 - 111 mmol/L   CO2 24 22 - 32 mmol/L   Glucose, Bld 216 (H) 70 - 99 mg/dL   BUN 30 (H) 8 - 23 mg/dL   Creatinine, Ser 5.62 (H) 0.61 - 1.24 mg/dL   Calcium 9.6 8.9 - 13.0 mg/dL   GFR, Estimated 32 (L) >60 mL/min   Anion gap 9 5 - 15  CK   Collection Time: 10/26/23  3:13 AM   Result Value Ref Range   Total CK 577 (H) 49 - 397 U/L  CBG monitoring, ED   Collection Time: 10/26/23  7:43 AM  Result Value Ref Range   Glucose-Capillary 152 (H) 70 - 99 mg/dL  Basic metabolic panel   Collection Time: 10/26/23  8:02 AM  Result Value Ref Range   Sodium 139 135 - 145 mmol/L   Potassium 4.7 3.5 - 5.1 mmol/L   Chloride 104 98 - 111 mmol/L   CO2 27 22 - 32 mmol/L   Glucose, Bld 161 (H) 70 - 99 mg/dL   BUN 31 (H) 8 - 23 mg/dL   Creatinine, Ser 8.65 (H) 0.61 - 1.24 mg/dL   Calcium 8.7 (L) 8.9 - 10.3 mg/dL   GFR, Estimated 35 (L) >60 mL/min   Anion gap 8 5 - 15  Urinalysis, Routine w reflex microscopic -Urine, Clean Catch   Collection Time: 10/26/23  8:17 AM  Result Value Ref Range   Color, Urine YELLOW YELLOW   APPearance CLEAR CLEAR   Specific Gravity, Urine >1.046 (H) 1.005 - 1.030   pH 5.0 5.0 - 8.0   Glucose, UA NEGATIVE NEGATIVE mg/dL   Hgb urine dipstick MODERATE (A) NEGATIVE   Bilirubin Urine NEGATIVE NEGATIVE   Ketones, ur TRACE (A) NEGATIVE mg/dL   Protein, ur 30 (A) NEGATIVE mg/dL   Nitrite NEGATIVE NEGATIVE   Leukocytes,Ua NEGATIVE NEGATIVE   RBC / HPF 0-5 0 -  5 RBC/hpf   WBC, UA 0-5 0 - 5 WBC/hpf   Bacteria, UA NONE SEEN NONE SEEN   Squamous Epithelial / HPF 0-5 0 - 5 /HPF   Hyaline Casts, UA PRESENT    Granular Casts, UA PRESENT   CBG monitoring, ED   Collection Time: 10/26/23 12:22 PM  Result Value Ref Range   Glucose-Capillary 155 (H) 70 - 99 mg/dL  CBC   Collection Time: 10/26/23 12:23 PM  Result Value Ref Range   WBC 10.7 (H) 4.0 - 10.5 K/uL   RBC 3.12 (L) 4.22 - 5.81 MIL/uL   Hemoglobin 10.0 (L) 13.0 - 17.0 g/dL   HCT 65.7 (L) 84.6 - 96.2 %   MCV 92.9 80.0 - 100.0 fL   MCH 32.1 26.0 - 34.0 pg   MCHC 34.5 30.0 - 36.0 g/dL   RDW 95.2 84.1 - 32.4 %   Platelets 141 (L) 150 - 400 K/uL   nRBC 0.0 0.0 - 0.2 %  Surgical PCR screen   Collection Time: 10/27/23  4:06 AM   Specimen: Nasal Mucosa; Nasal Swab  Result Value Ref Range    MRSA, PCR NEGATIVE NEGATIVE   Staphylococcus aureus NEGATIVE NEGATIVE  CBC   Collection Time: 10/27/23  5:46 AM  Result Value Ref Range   WBC 11.0 (H) 4.0 - 10.5 K/uL   RBC 2.68 (L) 4.22 - 5.81 MIL/uL   Hemoglobin 8.4 (L) 13.0 - 17.0 g/dL   HCT 40.1 (L) 02.7 - 25.3 %   MCV 93.3 80.0 - 100.0 fL   MCH 31.3 26.0 - 34.0 pg   MCHC 33.6 30.0 - 36.0 g/dL   RDW 66.4 40.3 - 47.4 %   Platelets 124 (L) 150 - 400 K/uL   nRBC 0.0 0.0 - 0.2 %  Basic metabolic panel   Collection Time: 10/27/23  5:46 AM  Result Value Ref Range   Sodium 136 135 - 145 mmol/L   Potassium 4.2 3.5 - 5.1 mmol/L   Chloride 107 98 - 111 mmol/L   CO2 24 22 - 32 mmol/L   Glucose, Bld 167 (H) 70 - 99 mg/dL   BUN 36 (H) 8 - 23 mg/dL   Creatinine, Ser 2.59 (H) 0.61 - 1.24 mg/dL   Calcium 7.8 (L) 8.9 - 10.3 mg/dL   GFR, Estimated 33 (L) >60 mL/min   Anion gap 5 5 - 15  Glucose, capillary   Collection Time: 10/27/23  8:46 AM  Result Value Ref Range   Glucose-Capillary 152 (H) 70 - 99 mg/dL  Prepare RBC (crossmatch)   Collection Time: 10/27/23  8:49 AM  Result Value Ref Range   Order Confirmation      ORDER PROCESSED BY BLOOD BANK Performed at [facility], [address] [place], [place] [address]   Type and screen [facility]   Collection Time: 10/27/23  9:00 AM  Result Value Ref Range   ABO/RH(D) O NEG    Antibody Screen NEG    Sample Expiration 10/30/2023,2359    Unit Number [id number]    Blood Component Type RED CELLS,LR    Unit division 00    Status of Unit REL FROM [facility]    Transfusion Status OK TO TRANSFUSE    Crossmatch Result      Compatible Performed at [facility], [address] [place], [place] [address]    Unit Number [id number]    Blood Component Type RED CELLS,LR    Unit division 00  Status of Unit REL FROM [facility]    Transfusion Status OK TO TRANSFUSE    Crossmatch Result Compatible   BPAM RBC   Collection Time: 10/27/23  9:00 AM  Result Value Ref  Range   Blood Product Unit Number [id number]    PRODUCT CODE E0382V00    Unit Type and Rh 9500    Blood Product Expiration Date 846962952841    Blood Product Unit Number [id number]    PRODUCT CODE E0382V00    Unit Type and Rh 9500    Blood Product Expiration Date 644034742595   Glucose, capillary   Collection Time: 10/27/23 10:53 AM  Result Value Ref Range   Glucose-Capillary 156 (H) 70 - 99 mg/dL  CBC   Collection Time: 10/27/23  3:45 PM  Result Value Ref Range   WBC 11.3 (H) 4.0 - 10.5 K/uL   RBC 2.59 (L) 4.22 - 5.81 MIL/uL   Hemoglobin 8.4 (L) 13.0 - 17.0 g/dL   HCT 63.8 (L) 75.6 - 43.3 %   MCV 95.8 80.0 - 100.0 fL   MCH 32.4 26.0 - 34.0 pg   MCHC 33.9 30.0 - 36.0 g/dL   RDW 29.5 18.8 - 41.6 %   Platelets 118 (L) 150 - 400 K/uL   nRBC 0.0 0.0 - 0.2 %  Creatinine, serum   Collection Time: 10/27/23  3:45 PM  Result Value Ref Range   Creatinine, Ser 1.87 (H) 0.61 - 1.24 mg/dL   GFR, Estimated 34 (L) >60 mL/min  Basic metabolic panel   Collection Time: 10/28/23  4:07 AM  Result Value Ref Range   Sodium 135 135 - 145 mmol/L   Potassium 4.2 3.5 - 5.1 mmol/L   Chloride 105 98 - 111 mmol/L   CO2 22 22 - 32 mmol/L   Glucose, Bld 156 (H) 70 - 99 mg/dL   BUN 29 (H) 8 - 23 mg/dL   Creatinine, Ser 6.06 (H) 0.61 - 1.24 mg/dL   Calcium 8.0 (L) 8.9 - 10.3 mg/dL   GFR, Estimated 41 (L) >60 mL/min   Anion gap 8 5 - 15  CBC   Collection Time: 10/28/23  4:07 AM  Result Value Ref Range   WBC 10.7 (H) 4.0 - 10.5 K/uL   RBC 2.57 (L) 4.22 - 5.81 MIL/uL   Hemoglobin 8.1 (L) 13.0 - 17.0 g/dL   HCT 30.1 (L) 60.1 - 09.3 %   MCV 94.6 80.0 - 100.0 fL   MCH 31.5 26.0 - 34.0 pg   MCHC 33.3 30.0 - 36.0 g/dL   RDW 23.5 57.3 - 22.0 %   Platelets 116 (L) 150 - 400 K/uL   nRBC 0.0 0.0 - 0.2 %  VITAMIN D  25 Hydroxy (Vit-D Deficiency, Fractures)   Collection Time: 10/28/23  2:07 PM  Result Value Ref Range   Vit D, 25-Hydroxy 18.77 (L) 30 - 100 ng/mL  CBC   Collection Time: 10/29/23   7:46 AM  Result Value Ref Range   WBC 8.7 4.0 - 10.5 K/uL   RBC 2.24 (L) 4.22 - 5.81 MIL/uL   Hemoglobin 7.2 (L) 13.0 - 17.0 g/dL   HCT 25.4 (L) 27.0 - 62.3 %   MCV 93.3 80.0 - 100.0 fL   MCH 32.1 26.0 - 34.0 pg   MCHC 34.4 30.0 - 36.0 g/dL   RDW 76.2 83.1 - 51.7 %   Platelets 132 (L) 150 - 400 K/uL   nRBC 0.0 0.0 - 0.2 %  Basic metabolic panel   Collection Time: 10/29/23  7:46 AM  Result Value Ref Range   Sodium 136 135 - 145 mmol/L   Potassium 4.3 3.5 - 5.1 mmol/L   Chloride 106 98 - 111 mmol/L   CO2 23 22 - 32 mmol/L   Glucose, Bld 165 (H) 70 - 99 mg/dL   BUN 24 (H) 8 - 23 mg/dL   Creatinine, Ser 1.61 (H) 0.61 - 1.24 mg/dL   Calcium 7.8 (L) 8.9 - 10.3 mg/dL   GFR, Estimated 47 (L) >60 mL/min   Anion gap 7 5 - 15  CBC   Collection Time: 10/31/23  6:25 AM  Result Value Ref Range   WBC 7.6 4.0 - 10.5 K/uL   RBC 2.21 (L) 4.22 - 5.81 MIL/uL   Hemoglobin 7.0 (L) 13.0 - 17.0 g/dL   HCT 09.6 (L) 04.5 - 40.9 %   MCV 93.2 80.0 - 100.0 fL   MCH 31.7 26.0 - 34.0 pg   MCHC 34.0 30.0 - 36.0 g/dL   RDW 81.1 91.4 - 78.2 %   Platelets 187 150 - 400 K/uL   nRBC 0.0 0.0 - 0.2 %  Type and screen [facility]   Collection Time: 10/31/23 10:54 AM  Result Value Ref Range   ABO/RH(D) O NEG    Antibody Screen NEG    Sample Expiration 11/03/2023,2359    Unit Number [id number]    Blood Component Type RED CELLS,LR    Unit division 00    Status of Unit ISSUED,FINAL    Transfusion Status OK TO TRANSFUSE    Crossmatch Result      Compatible Performed at [facility], [address], [place], [place] [address]   [facility]   Collection Time: 10/31/23 10:54 AM  Result Value Ref Range   ISSUE DATE / TIME 528413244010    Blood Product Unit Number [id number]    PRODUCT CODE Q2595G38    Unit Type and Rh 9500    Blood Product Expiration Date 756433295188   Prepare RBC (crossmatch)   Collection Time: 10/31/23 10:56 AM  Result Value Ref Range   Order Confirmation       ORDER PROCESSED BY BLOOD BANK Performed at [facility], [address] [place], [place] [address]   Hemoglobin and hematocrit, blood   Collection Time: 10/31/23  8:34 PM  Result Value Ref Range   Hemoglobin 8.8 (L) 13.0 - 17.0 g/dL   HCT 63.0 (L) 16.0 - 10.9 %  CBC   Collection Time: 11/01/23  6:30 AM  Result Value Ref Range   WBC 7.9 4.0 - 10.5 K/uL   RBC 2.64 (L) 4.22 - 5.81 MIL/uL   Hemoglobin 8.3 (L) 13.0 - 17.0 g/dL   HCT 32.3 (L) 55.7 - 32.2 %   MCV 92.4 80.0 - 100.0 fL   MCH 31.4 26.0 - 34.0 pg   MCHC 34.0 30.0 - 36.0 g/dL   RDW 02.5 42.7 - 06.2 %   Platelets 240 150 - 400 K/uL   nRBC 0.0 0.0 - 0.2 %    Assessment & Plan:   Problem List Items Addressed This Visit     Type 2 diabetes mellitus with neurological manifestations, controlled (HCC) - Primary   Relevant Medications   aspirin  EC 325 MG tablet   Other Relevant Orders   CBC with Differential/Platelet   Hemoglobin A1c   Renal function panel   Hearing loss   Relevant Orders   Ambulatory referral to Audiology     No orders of  the defined types were placed in this encounter.   Orders Placed This Encounter  Procedures   CBC with Differential/Platelet   Hemoglobin A1c   Renal function panel   Ambulatory referral to Audiology    Referral Priority:   Routine    Referral Type:   Audiology Exam    Referral Reason:   Specialty Services Required    Number of Visits Requested:   1    Patient Instructions  Discuss outpatient physical therapy with Dr Curtiss Dowdy.  We will refer you to hearing doctor for evaluation.  Labs today.  Good to see you today  Increase water  intake.  May use glucerna supplement.  Increase fruit and water  in diet.   Follow up plan: No follow-ups on file.  Claire Crick, MD

## 2023-12-13 ENCOUNTER — Other Ambulatory Visit: Payer: Self-pay | Admitting: Family Medicine

## 2023-12-13 DIAGNOSIS — S329XXA Fracture of unspecified parts of lumbosacral spine and pelvis, initial encounter for closed fracture: Secondary | ICD-10-CM | POA: Insufficient documentation

## 2023-12-13 DIAGNOSIS — S2249XA Multiple fractures of ribs, unspecified side, initial encounter for closed fracture: Secondary | ICD-10-CM | POA: Insufficient documentation

## 2023-12-13 DIAGNOSIS — I10 Essential (primary) hypertension: Secondary | ICD-10-CM

## 2023-12-13 DIAGNOSIS — S32811D Multiple fractures of pelvis with unstable disruption of pelvic ring, subsequent encounter for fracture with routine healing: Secondary | ICD-10-CM | POA: Diagnosis not present

## 2023-12-13 MED ORDER — LOSARTAN POTASSIUM 25 MG PO TABS: 25.0000 mg | ORAL_TABLET | Freq: Every day | ORAL | 0 refills | Status: DC

## 2023-12-13 NOTE — Assessment & Plan Note (Signed)
 Agrees to audiology referral in [place]. Daughter requests to be contacted to schedule appt.

## 2023-12-13 NOTE — Assessment & Plan Note (Addendum)
 Previously well managed with miralax . Continue this and colace PRN.  Discussed importance of regular fiber intake, water  intake, discussed increased fruits/vegetables in diet.

## 2023-12-13 NOTE — Assessment & Plan Note (Signed)
 Chronic, stable on metformin  and glipizide  XL medication. Update A1c today.

## 2023-12-13 NOTE — Assessment & Plan Note (Signed)
This has healed well. 

## 2023-12-13 NOTE — Assessment & Plan Note (Signed)
 Did have 1 month stay at [facility]. Recovering well Has ortho f/u tomorrow. Did not qualify for HHPT.  Will recommend outpatient PT - he will check with ortho.

## 2023-12-13 NOTE — Assessment & Plan Note (Signed)
Update renal panel 

## 2023-12-13 NOTE — Assessment & Plan Note (Signed)
 Fell off roof with rib and pelvic fractures s/p percutaneous pelvic pinning surgery ([facility]) 11/2023 Pain well controlled Has ortho f/u. See below.

## 2023-12-21 ENCOUNTER — Other Ambulatory Visit: Payer: Self-pay | Admitting: Family Medicine

## 2023-12-21 ENCOUNTER — Encounter (INDEPENDENT_AMBULATORY_CARE_PROVIDER_SITE_OTHER): Payer: Self-pay | Admitting: Otolaryngology

## 2023-12-21 DIAGNOSIS — E1149 Type 2 diabetes mellitus with other diabetic neurological complication: Secondary | ICD-10-CM

## 2023-12-21 DIAGNOSIS — N2581 Secondary hyperparathyroidism of renal origin: Secondary | ICD-10-CM

## 2023-12-21 DIAGNOSIS — E538 Deficiency of other specified B group vitamins: Secondary | ICD-10-CM

## 2023-12-21 DIAGNOSIS — N183 Chronic kidney disease, stage 3 unspecified: Secondary | ICD-10-CM

## 2023-12-21 DIAGNOSIS — E559 Vitamin D deficiency, unspecified: Secondary | ICD-10-CM

## 2023-12-21 DIAGNOSIS — E1169 Type 2 diabetes mellitus with other specified complication: Secondary | ICD-10-CM

## 2023-12-26 ENCOUNTER — Other Ambulatory Visit (INDEPENDENT_AMBULATORY_CARE_PROVIDER_SITE_OTHER): Payer: Medicare HMO

## 2023-12-26 DIAGNOSIS — E1169 Type 2 diabetes mellitus with other specified complication: Secondary | ICD-10-CM | POA: Diagnosis not present

## 2023-12-26 DIAGNOSIS — E785 Hyperlipidemia, unspecified: Secondary | ICD-10-CM

## 2023-12-26 DIAGNOSIS — E1122 Type 2 diabetes mellitus with diabetic chronic kidney disease: Secondary | ICD-10-CM | POA: Diagnosis not present

## 2023-12-26 DIAGNOSIS — N183 Chronic kidney disease, stage 3 unspecified: Secondary | ICD-10-CM | POA: Diagnosis not present

## 2023-12-26 DIAGNOSIS — N2581 Secondary hyperparathyroidism of renal origin: Secondary | ICD-10-CM | POA: Diagnosis not present

## 2023-12-26 DIAGNOSIS — E1149 Type 2 diabetes mellitus with other diabetic neurological complication: Secondary | ICD-10-CM

## 2023-12-26 DIAGNOSIS — E559 Vitamin D deficiency, unspecified: Secondary | ICD-10-CM

## 2023-12-26 DIAGNOSIS — E538 Deficiency of other specified B group vitamins: Secondary | ICD-10-CM | POA: Diagnosis not present

## 2023-12-26 LAB — COMPREHENSIVE METABOLIC PANEL WITH GFR
ALT: 11 U/L (ref 0–53)
AST: 14 U/L (ref 0–37)
Albumin: 3.9 g/dL (ref 3.5–5.2)
Alkaline Phosphatase: 92 U/L (ref 39–117)
BUN: 22 mg/dL (ref 6–23)
CO2: 27 meq/L (ref 19–32)
Calcium: 9 mg/dL (ref 8.4–10.5)
Chloride: 103 meq/L (ref 96–112)
Creatinine, Ser: 1.24 mg/dL (ref 0.40–1.50)
GFR: 51.96 mL/min — ABNORMAL LOW (ref >60.00)
Glucose, Bld: 136 mg/dL — ABNORMAL HIGH (ref 70–99)
Potassium: 5 meq/L (ref 3.5–5.1)
Sodium: 137 meq/L (ref 135–145)
Total Bilirubin: 0.4 mg/dL (ref 0.2–1.2)
Total Protein: 6.3 g/dL (ref 6.0–8.3)

## 2023-12-26 LAB — LIPID PANEL
Cholesterol: 169 mg/dL (ref 0–200)
HDL: 52.6 mg/dL (ref >39.00)
LDL Cholesterol: 88 mg/dL (ref 0–99)
NonHDL: 116.79
Total CHOL/HDL Ratio: 3
Triglycerides: 142 mg/dL (ref 0.0–149.0)
VLDL: 28.4 mg/dL (ref 0.0–40.0)

## 2023-12-26 LAB — VITAMIN B12: Vitamin B-12: 665 pg/mL (ref 211–911)

## 2023-12-26 LAB — PHOSPHORUS: Phosphorus: 3.3 mg/dL (ref 2.3–4.6)

## 2023-12-26 LAB — MICROALBUMIN / CREATININE URINE RATIO
Creatinine,U: 67.3 mg/dL
Microalb Creat Ratio: 12.7 mg/g (ref 0.0–30.0)
Microalb, Ur: 0.9 mg/dL (ref 0.0–1.9)

## 2023-12-26 LAB — VITAMIN D 25 HYDROXY (VIT D DEFICIENCY, FRACTURES): VITD: 35.7 ng/mL (ref 30.00–100.00)

## 2023-12-27 LAB — PARATHYROID HORMONE, INTACT (NO CA): PTH: 30 pg/mL (ref 16–77)

## 2023-12-29 ENCOUNTER — Ambulatory Visit: Payer: Self-pay | Admitting: Family Medicine

## 2023-12-29 ENCOUNTER — Ambulatory Visit: Payer: Self-pay

## 2023-12-29 NOTE — Telephone Encounter (Signed)
  Chief Complaint: hot flash that takes 4 hours to go away thinks it is due to 325 mg aspirin  Symptoms: none Frequency: 3 days  Pertinent Negatives: Patient denies any sx- no cold sx, or urinary sx or pain  Disposition: [] ED /[] Urgent Care (no appt availability in office) / [] Appointment(In office/virtual)/ []  [place] [facility]/ [] Home Care/ [] Refused Recommended Disposition /[] [place] [facility]/ [x]  Follow-up with PCP Additional Notes: pt has an appt next week for a CPE Reason for Disposition  [1] Fever AND [2] no signs of serious infection or localizing symptoms (all other triage questions negative)    NO FEVER- has hot flashes only  Answer Assessment - Initial Assessment Questions 1. TEMPERATURE: "What is the most recent temperature?"  "How was it measured?"     97.9 oral 98.2 2. ONSET: "When did the fever start?"      3 night 3. CHILLS: "Do you have chills?" If yes: "How bad are they?"  (e.g., none, mild, moderate, severe)   - NONE: no chills   - MILD: feeling cold   - MODERATE: feeling very cold, some shivering (feels better under a thick blanket)   - SEVERE: feeling extremely cold with shaking chills (general body shaking, rigors; even under a thick blanket)      Not hot at present 4. OTHER SYMPTOMS: "Do you have any other symptoms besides the fever?"  (e.g., abdomen pain, cough, diarrhea, earache, headache, sore throat, urination pain)     none 5. CAUSE: If there are no symptoms, ask: "What do you think is causing the fever?"      325 mg Aspirin  6. CONTACTS: "Does anyone else in the family have an infection?"     no 7. TREATMENT: "What have you done so far to treat this fever?" (e.g., medications)     Nothing  8. IMMUNOCOMPROMISE: "Do you have of the following: diabetes, HIV positive, splenectomy, cancer chemotherapy, chronic steroid treatment, transplant patient, etc."     diabetes  Protocols used: Fever-A-AH

## 2023-12-29 NOTE — Telephone Encounter (Signed)
 Copied from CRM [id number]. Topic: Clinical - Medical Advice >> Dec 29, 2023 12:05 PM Martinique E wrote: Reason for CRM: Patient's daughter, John Sanford, called in stating that her father said he is feeling "hot" and questioning if it as anything to do with him being on aspirin  EC 325 MG tablet. Amber stated that she does not think he has a fever, just wants to know if that medication is related to him feeling hot. Callback number for daughter is [phone].  Called and LM to call back. Called [phone].

## 2023-12-29 NOTE — Telephone Encounter (Signed)
 I would check temp at home.  If he has a fever, or if sx persist, then I would get checked here in clinic.  Thanks.

## 2023-12-30 ENCOUNTER — Telehealth: Payer: Self-pay | Admitting: Family Medicine

## 2023-12-30 NOTE — Telephone Encounter (Signed)
 See 12/29/23 Nurse Triage note.

## 2023-12-30 NOTE — Telephone Encounter (Signed)
 Copied from CRM [id number]. Topic: General - Other >> Dec 30, 2023 10:57 AM Albertha Alosa wrote: Reason for CRM: Patient daughter called regarding missed call, informed her per note need patient temperature, she stated the highest temperature he had last night was 98.7 at 3 am   [phone]

## 2023-12-30 NOTE — Telephone Encounter (Signed)
 Left message for patient to return call to office. Checking to see how he is doing. Is he running a temperature and still having symptoms.

## 2023-12-30 NOTE — Telephone Encounter (Signed)
Noted. Fyi to Dr. Para March.

## 2023-12-30 NOTE — Telephone Encounter (Signed)
 Noted. If he has a fever, or if sx persist, then I would get checked here in clinic. Thanks.

## 2023-12-30 NOTE — Telephone Encounter (Signed)
 Copied from CRM [id number]. Topic: General - Other >> Dec 30, 2023  1:54 PM Chasity T wrote: Reason for CRM: Hospital doctor, patient daughter has called in to return a call missed today. Please contact her back on her cell phone at [phone].

## 2023-12-30 NOTE — Telephone Encounter (Signed)
 Lvm asking pt to call back. Need updated temp on pt. If fever and/or still feeling hot, pt needs OV with an available provider.

## 2024-01-02 ENCOUNTER — Encounter: Payer: Medicare HMO | Admitting: Family Medicine

## 2024-01-03 DIAGNOSIS — S32811D Multiple fractures of pelvis with unstable disruption of pelvic ring, subsequent encounter for fracture with routine healing: Secondary | ICD-10-CM | POA: Diagnosis not present

## 2024-01-05 ENCOUNTER — Ambulatory Visit (INDEPENDENT_AMBULATORY_CARE_PROVIDER_SITE_OTHER): Payer: Medicare HMO

## 2024-01-05 VITALS — Ht 66.75 in | Wt 169.0 lb

## 2024-01-05 DIAGNOSIS — Z0000 Encounter for general adult medical examination without abnormal findings: Secondary | ICD-10-CM | POA: Diagnosis not present

## 2024-01-05 NOTE — Progress Notes (Signed)
 Please attest and cosign this visit due to patients primary care provider not being in the office at the time the visit was completed.    Subjective:   John Sanford is a 89 y.o. who presents for a Medicare Wellness preventive visit.  As a reminder, Annual Wellness Visits don't include a physical exam, and some assessments may be limited, especially if this visit is performed virtually. We may recommend an in-person follow-up visit with your provider if needed.  Visit Complete: Virtual I connected with  Antoine Kirsch on 01/05/24 by a audio enabled telemedicine application and verified that I am speaking with the correct person using two identifiers.  Patient Location: Home  Provider Location: Office/Clinic  I discussed the limitations of evaluation and management by telemedicine. The patient expressed understanding and agreed to proceed.  Vital Signs: Because this visit was a virtual/telehealth visit, some criteria may be missing or patient reported. Any vitals not documented were not able to be obtained and vitals that have been documented are patient reported.  VideoDeclined- This patient declined Librarian, academic. Therefore the visit was completed with audio only.  Persons Participating in Visit: Patient.  AWV Questionnaire: No: Patient Medicare AWV questionnaire was not completed prior to this visit.  Cardiac Risk Factors include: advanced age (>71men, >68 women);diabetes mellitus;hypertension;dyslipidemia;male gender;sedentary lifestyle     Objective:     Today's Vitals   01/05/24 1015  Weight: 169 lb (76.7 kg)  Height: 5' 6.75" (1.695 m)   Body mass index is 26.67 kg/m.     01/05/2024   10:28 AM 10/25/2023    9:35 PM 04/29/2023   10:39 AM 01/28/2023   10:40 AM 01/04/2023   10:43 AM 12/31/2021   11:20 AM 12/30/2020   11:22 AM  Advanced Directives  Does Patient Have a Medical Advance Directive? Yes No No No Yes Yes Yes  Type  of Estate agent of [place];Living will  Healthcare Power of [place];Living will  Healthcare Power of [place];Living will Healthcare Power of [place];Living will Healthcare Power of [place];Living will  Does patient want to make changes to medical advance directive?     No - Patient declined Yes (Inpatient - patient defers changing a medical advance directive and declines information at this time)   Copy of Healthcare Power of Attorney in Chart? Yes - validated most recent copy scanned in chart (See row information)    Yes - validated most recent copy scanned in chart (See row information) Yes - validated most recent copy scanned in chart (See row information) No - copy requested  Would patient like information on creating a medical advance directive?  No - Patient declined         Current Medications (verified) Outpatient Encounter Medications as of 01/05/2024  Medication Sig   acetaminophen  (TYLENOL ) 500 MG tablet Take 2 tablets (1,000 mg total) by mouth every 8 (eight) hours as needed for mild pain (pain score 1-3) or moderate pain (pain score 4-6).   amLODipine  (NORVASC ) 5 MG tablet Take 1 tablet (5 mg total) by mouth daily.   ascorbic acid  (VITAMIN C ) 500 MG tablet Take 1 tablet (500 mg total) by mouth 2 (two) times daily.   aspirin  EC 325 MG tablet Take 325 mg by mouth daily.   cyanocobalamin  (VITAMIN B12) 1000 MCG tablet Take 1 tablet (1,000 mcg total) by mouth daily.   Docusate Calcium (STOOL SOFTENER PO) Take by mouth as needed.   ferrous sulfate  325 (65 FE)  MG tablet Take 1 tablet (325 mg total) by mouth 2 (two) times daily with a meal.   glipiZIDE  (GLUCOTROL  XL) 5 MG 24 hr tablet Take 1 tablet (5 mg total) by mouth daily with breakfast.   loratadine (CLARITIN) 10 MG tablet Take 10 mg by mouth daily.   losartan  (COZAAR ) 25 MG tablet Take 1 tablet (25 mg total) by mouth daily.   metFORMIN  (GLUCOPHAGE ) 500 MG tablet Take 1 tablet (500 mg total) by mouth 2 (two)  times daily with a meal.   polyethylene glycol powder (GLYCOLAX /MIRALAX ) 17 GM/SCOOP powder Take 8.5-17 g by mouth daily as needed for moderate constipation. (1/2-1 capful) (Patient taking differently: Take 8.5-17 g by mouth daily as needed for moderate constipation. (1/2-1 capful) Takes daily)   pravastatin  (PRAVACHOL ) 80 MG tablet Take 1 tablet (80 mg total) by mouth daily.   tamsulosin  (FLOMAX ) 0.4 MG CAPS capsule Take 1 capsule (0.4 mg total) by mouth daily.   Vitamin D , Ergocalciferol , (DRISDOL ) 1.25 MG (50000 UNIT) CAPS capsule Take 1 capsule (50,000 Units total) by mouth every 7 (seven) days.   No facility-administered encounter medications on file as of 01/05/2024.    Allergies (verified) Patient has no known allergies.   History: Past Medical History:  Diagnosis Date   Allergy    Arthritis    BPH (benign prostatic hypertrophy)    Cancer (HCC)    hx of skin cancer    Diabetes mellitus    type 2    Hyperlipidemia    Hypertension    Pneumonia    hx of    Past Surgical History:  Procedure Laterality Date   CATARACT EXTRACTION W/ INTRAOCULAR LENS IMPLANT     COLONOSCOPY     ORIF PELVIC FRACTURE WITH PERCUTANEOUS SCREWS Left 10/27/2023   Procedure: CLOSED REDUCTION, PELVIS, WITH PERCUTANEOUS FIXATION;  Surgeon: Laneta Pintos, MD;  Location: [facility] OR;  Service: Orthopedics;  Laterality: Left;   PROSTATE BIOPSY     negative   TONSILLECTOMY AND ADENOIDECTOMY     TOTAL HIP ARTHROPLASTY Left 07/29/2020   Procedure: LEFT TOTAL HIP ARTHROPLASTY ANTERIOR APPROACH;  Surgeon: Dayne Even, MD;  Location: [facility];  Service: Orthopedics;  Laterality: Left;   VENTRAL HERNIA REPAIR     periumbilical   Family History  Problem Relation Age of Onset   Heart disease Mother    Diabetes Father    Heart disease Father    Diabetes Brother    Heart disease Brother    Pancreatic cancer Brother        90 yo   Social History   Socioeconomic History   Marital status: Widowed    Spouse  name: Not on file   Number of children: 3   Years of education: 63   Highest education level: Not on file  Occupational History   Occupation: retired Naval architect  Tobacco Use   Smoking status: Former    Current packs/day: 0.00    Average packs/day: 2.0 packs/day for 20.0 years (40.0 ttl pk-yrs)    Types: Cigarettes    Start date: 08/16/1952    Quit date: 08/16/1972    Years since quitting: 51.4   Smokeless tobacco: Never  Vaping Use   Vaping status: Never Used  Substance and Sexual Activity   Alcohol use: No    Comment: rare   Drug use: No   Sexual activity: Not on file  Other Topics Concern   Not on file  Social History Narrative   Has living will.  Has health care POA---daughter then son   Would accept resuscitation--but no prolonged artificial ventilation   Would not want feeding tube if cognitively unaware   Widower - wife deceased 12/15/06    Daughter lives nearby, son in [place] in [place]    Social Drivers of Health   Financial Resource Strain: Low Risk  (01/05/2024)   Overall Financial Resource Strain (CARDIA)    Difficulty of Paying Living Expenses: Not hard at all  Food Insecurity: No Food Insecurity (01/05/2024)   Hunger Vital Sign    Worried About Running Out of Food in the Last Year: Never true    Ran Out of Food in the Last Year: Never true  Transportation Needs: No Transportation Needs (01/05/2024)   PRAPARE - Administrator, Civil Service (Medical): No    Lack of Transportation (Non-Medical): No  Physical Activity: Inactive (01/05/2024)   Exercise Vital Sign    Days of Exercise per Week: 0 days    Minutes of Exercise per Session: 0 min  Stress: No Stress Concern Present (01/05/2024)   [employer] of Occupational Health - Occupational Stress Questionnaire    Feeling of Stress : Not at all  Social Connections: Moderately Isolated (01/05/2024)   Social Connection and Isolation Panel [NHANES]    Frequency of Communication with  Friends and Family: More than three times a week    Frequency of Social Gatherings with Friends and Family: More than three times a week    Attends Religious Services: 1 to 4 times per year    Active Member of [employer] or Organizations: No    Attends Banker Meetings: Never    Marital Status: Widowed    Tobacco Counseling Counseling given: Not Answered    Clinical Intake:  Pre-visit preparation completed: Yes  Pain : No/denies pain     BMI - recorded: 26.67 Nutritional Status: BMI 25 -29 Overweight Nutritional Risks: None Diabetes: Yes CBG done?: No Did pt. bring in CBG monitor from home?: No  Lab Results  Component Value Date   HGBA1C 6.2 12/12/2023   HGBA1C 5.7 (A) 07/05/2023   HGBA1C 6.4 12/24/2022     How often do you need to have someone help you when you read instructions, pamphlets, or other written materials from your doctor or pharmacy?: 1 - Never  Interpreter Needed?: No  Comments: lives alone at daughter's house right now Information entered by :: B.Jesaiah Fabiano,LPN   Activities of Daily Living     01/05/2024   10:28 AM 10/27/2023    5:00 AM  In your present state of health, do you have any difficulty performing the following activities:  Hearing? 0 0  Vision? 0 0  Difficulty concentrating or making decisions? 1 0  Walking or climbing stairs? 1   Dressing or bathing? 0   Doing errands, shopping? 0   Preparing Food and eating ? N   Using the Toilet? N   In the past six months, have you accidently leaked urine? N   Do you have problems with loss of bowel control? N   Managing your Medications? Y   Managing your Finances? N   Housekeeping or managing your Housekeeping? Y     Patient Care Team: Claire Crick, MD as PCP - General (Family Medicine)  Indicate any recent Medical Services you may have received from other than [facility] providers in the past year (date may be approximate).     Assessment:    This is  a routine wellness  examination for [place].  Hearing/Vision screen Hearing Screening - Comments:: Pt says his hearing is good with hearing aids Vision Screening - Comments:: Pt says his vision is good   Goals Addressed             This Visit's Progress    DIET - EAT MORE FRUITS AND VEGETABLES   On track    Patient Stated   On track    01/05/24- I will maintain and continue medications as prescribed.      Patient Stated   On track    I would like to get back to my garden and cutting my wood from pelvic surgery       Depression Screen     01/05/2024   10:24 AM 01/14/2023   11:11 AM 01/04/2023   10:42 AM 12/31/2021   11:15 AM 12/30/2020   11:25 AM 05/26/2020   10:02 AM 04/25/2019   12:16 PM  PHQ 2/9 Scores  PHQ - 2 Score 0 0 0 0 0 0 0  PHQ- 9 Score     0      Fall Risk     01/05/2024   10:19 AM 01/04/2023   10:34 AM 12/31/2021   11:22 AM 12/30/2020   11:23 AM 05/26/2020   10:02 AM  Fall Risk   Falls in the past year? 1 0 0 0 0  Number falls in past yr: 0 0 0 0   Injury with Fall? 1 0 0 0   Risk for fall due to : No Fall Risks No Fall Risks No Fall Risks Medication side effect   Follow up Education provided;Falls prevention discussed Falls prevention discussed;Falls evaluation completed Falls evaluation completed Falls evaluation completed;Falls prevention discussed     MEDICARE RISK AT HOME:  Medicare Risk at Home Any stairs in or around the home?: Yes If so, are there any without handrails?: Yes Home free of loose throw rugs in walkways, pet beds, electrical cords, etc?: Yes Adequate lighting in your home to reduce risk of falls?: Yes Life alert?: No Use of a cane, walker or w/c?: Yes Grab bars in the bathroom?: Yes Shower chair or bench in shower?: Yes Elevated toilet seat or a handicapped toilet?: Yes  TIMED UP AND GO:  Was the test performed?  No  Cognitive Function: 6CIT completed    12/30/2020   11:28 AM  MMSE - Mini Mental State Exam  Not completed: Refused         01/05/2024   10:31 AM 01/04/2023   10:45 AM  6CIT Screen  What Year? 0 points 0 points  What month? 0 points 0 points  What time? 0 points 0 points  Count back from 20 0 points 0 points  Months in reverse 0 points 0 points  Repeat phrase 0 points 2 points  Total Score 0 points 2 points    Immunizations Immunization History  Administered Date(s) Administered   Fluad Quad(high Dose 65+) 04/25/2019, 05/21/2020, 07/12/2022   Fluad Trivalent(High Dose 65+) 07/05/2023   Influenza Split 07/27/2011   Influenza Whole 07/26/2007, 06/13/2008, 07/25/2009, 07/24/2010   Influenza, Seasonal, Injecte, Preservative Fre 08/30/2012   Influenza,inj,Quad PF,6+ Mos 05/28/2013, 09/20/2014, 04/21/2016, 05/10/2017, 05/17/2018   PFIZER(Purple Top)SARS-COV-2 Vaccination 09/11/2019, 10/02/2019   Pneumococcal Conjugate-13 03/12/2014   Pneumococcal Polysaccharide-23 07/24/2010, 11/15/2017   Td 02/13/2006, 01/15/2011   Zoster Recombinant(Shingrix) 01/06/2022   Zoster, Live 09/24/2013    Screening Tests Health Maintenance  Topic Date Due   COVID-19 Vaccine (  3 - Pfizer risk series) 10/30/2019   DTaP/Tdap/Td (3 - Tdap) 01/14/2021   OPHTHALMOLOGY EXAM  04/28/2021   Zoster Vaccines- Shingrix (2 of 2) 03/03/2022   INFLUENZA VACCINE  03/16/2024   HEMOGLOBIN A1C  06/12/2024   FOOT EXAM  07/04/2024   Medicare Annual Wellness (AWV)  01/04/2025   Pneumonia Vaccine 28+ Years old  Completed   HPV VACCINES  Aged Out   Meningococcal B Vaccine  Aged Out    Health Maintenance  Health Maintenance Due  Topic Date Due   COVID-19 Vaccine (3 - Pfizer risk series) 10/30/2019   DTaP/Tdap/Td (3 - Tdap) 01/14/2021   OPHTHALMOLOGY EXAM  04/28/2021   Zoster Vaccines- Shingrix (2 of 2) 03/03/2022   Health Maintenance Items Addressed: None   Additional Screening:  Vision Screening: Recommended annual ophthalmology exams for early detection of glaucoma and other disorders of the eye.  Dental Screening: Recommended  annual dental exams for proper oral hygiene  Community Resource Referral / Chronic Care Management: CRR required this visit?  No   CCM required this visit?  No   Plan:    I have personally reviewed and noted the following in the patient's chart:   Medical and social history Use of alcohol, tobacco or illicit drugs  Current medications and supplements including opioid prescriptions. Patient is not currently taking opioid prescriptions. Functional ability and status Nutritional status Physical activity Advanced directives List of other physicians Hospitalizations, surgeries, and ER visits in previous 12 months Vitals Screenings to include cognitive, depression, and falls Referrals and appointments  In addition, I have reviewed and discussed with patient certain preventive protocols, quality metrics, and best practice recommendations. A written personalized care plan for preventive services as well as general preventive health recommendations were provided to patient.   Nerissa Bannister, LPN   [id number]   After Visit Summary: (Declined) Due to this being a telephonic visit, with patients personalized plan was offered to patient but patient Declined AVS at this time   Notes: Nothing significant to report at this time.

## 2024-01-05 NOTE — Patient Instructions (Signed)
 [name] , Thank you for taking time out of your busy schedule to complete your Annual Wellness Visit with me. I enjoyed our conversation and look forward to speaking with you again next year. I, as well as your care team,  appreciate your ongoing commitment to your health goals. Please review the following plan we discussed and let me know if I can assist you in the future. Your Game plan/ To Do List    Follow up Visits: Next Medicare AWV with our clinical staff: 01/08/25 @ 10:10am televisit   Have you seen your provider in the last 6 months (3 months if uncontrolled diabetes)? Yes Next Office Visit with your provider: 01/06/24  Clinician Recommendations:  Aim for 30 minutes of exercise or brisk walking, 6-8 glasses of water , and 5 servings of fruits and vegetables each day.       This is a list of the screening recommended for you and due dates:  Health Maintenance  Topic Date Due   COVID-19 Vaccine (3 - Pfizer risk series) 10/30/2019   DTaP/Tdap/Td vaccine (3 - Tdap) 01/14/2021   Eye exam for diabetics  04/28/2021   Zoster (Shingles) Vaccine (2 of 2) 03/03/2022   Flu Shot  03/16/2024   Hemoglobin A1C  06/12/2024   Complete foot exam   07/04/2024   Medicare Annual Wellness Visit  01/04/2025   Pneumonia Vaccine  Completed   HPV Vaccine  Aged Out   Meningitis B Vaccine  Aged Out    Advanced directives: (In Chart) A copy of your advanced directives are scanned into your chart should your provider ever need it. Advance Care Planning is important because it:  [x]  Makes sure you receive the medical care that is consistent with your values, goals, and preferences  [x]  It provides guidance to your family and loved ones and reduces their decisional burden about whether or not they are making the right decisions based on your wishes.  Follow the link provided in your after visit summary or read over the paperwork we have mailed to you to help you started getting your Advance Directives  in place. If you need assistance in completing these, please reach out to us  so that we can help you!

## 2024-01-06 ENCOUNTER — Encounter: Payer: Self-pay | Admitting: Family Medicine

## 2024-01-06 ENCOUNTER — Ambulatory Visit (INDEPENDENT_AMBULATORY_CARE_PROVIDER_SITE_OTHER): Payer: Medicare HMO | Admitting: Family Medicine

## 2024-01-06 VITALS — BP 138/70 | HR 76 | Temp 97.8°F | Ht 66.5 in | Wt 173.2 lb

## 2024-01-06 DIAGNOSIS — Z7189 Other specified counseling: Secondary | ICD-10-CM

## 2024-01-06 DIAGNOSIS — E1169 Type 2 diabetes mellitus with other specified complication: Secondary | ICD-10-CM | POA: Diagnosis not present

## 2024-01-06 DIAGNOSIS — I739 Peripheral vascular disease, unspecified: Secondary | ICD-10-CM | POA: Diagnosis not present

## 2024-01-06 DIAGNOSIS — I10 Essential (primary) hypertension: Secondary | ICD-10-CM | POA: Diagnosis not present

## 2024-01-06 DIAGNOSIS — Z9189 Other specified personal risk factors, not elsewhere classified: Secondary | ICD-10-CM

## 2024-01-06 DIAGNOSIS — N400 Enlarged prostate without lower urinary tract symptoms: Secondary | ICD-10-CM

## 2024-01-06 DIAGNOSIS — E1149 Type 2 diabetes mellitus with other diabetic neurological complication: Secondary | ICD-10-CM | POA: Diagnosis not present

## 2024-01-06 DIAGNOSIS — E785 Hyperlipidemia, unspecified: Secondary | ICD-10-CM | POA: Diagnosis not present

## 2024-01-06 DIAGNOSIS — H9193 Unspecified hearing loss, bilateral: Secondary | ICD-10-CM

## 2024-01-06 DIAGNOSIS — E559 Vitamin D deficiency, unspecified: Secondary | ICD-10-CM | POA: Diagnosis not present

## 2024-01-06 DIAGNOSIS — E538 Deficiency of other specified B group vitamins: Secondary | ICD-10-CM | POA: Diagnosis not present

## 2024-01-06 DIAGNOSIS — E1122 Type 2 diabetes mellitus with diabetic chronic kidney disease: Secondary | ICD-10-CM | POA: Diagnosis not present

## 2024-01-06 DIAGNOSIS — N183 Chronic kidney disease, stage 3 unspecified: Secondary | ICD-10-CM

## 2024-01-06 DIAGNOSIS — N2581 Secondary hyperparathyroidism of renal origin: Secondary | ICD-10-CM

## 2024-01-06 DIAGNOSIS — Z0000 Encounter for general adult medical examination without abnormal findings: Secondary | ICD-10-CM

## 2024-01-06 DIAGNOSIS — S3282XA Multiple fractures of pelvis without disruption of pelvic ring, initial encounter for closed fracture: Secondary | ICD-10-CM | POA: Diagnosis not present

## 2024-01-06 DIAGNOSIS — Z0001 Encounter for general adult medical examination with abnormal findings: Secondary | ICD-10-CM | POA: Diagnosis not present

## 2024-01-06 DIAGNOSIS — K5909 Other constipation: Secondary | ICD-10-CM

## 2024-01-06 MED ORDER — METFORMIN HCL 500 MG PO TABS
500.0000 mg | ORAL_TABLET | Freq: Two times a day (BID) | ORAL | 4 refills | Status: AC
Start: 2024-01-06 — End: ?

## 2024-01-06 MED ORDER — GLIPIZIDE ER 5 MG PO TB24: 5.0000 mg | ORAL_TABLET | Freq: Every day | ORAL | 4 refills | Status: DC

## 2024-01-06 MED ORDER — LOSARTAN POTASSIUM 25 MG PO TABS: 25.0000 mg | ORAL_TABLET | Freq: Every day | ORAL | 4 refills | Status: AC

## 2024-01-06 MED ORDER — TAMSULOSIN HCL 0.4 MG PO CAPS: 0.4000 mg | ORAL_CAPSULE | Freq: Every day | ORAL | 4 refills | Status: AC

## 2024-01-06 MED ORDER — ASPIRIN 81 MG PO TBEC: 81.0000 mg | DELAYED_RELEASE_TABLET | Freq: Every day | ORAL | Status: AC

## 2024-01-06 MED ORDER — AMLODIPINE BESYLATE 5 MG PO TABS: 5.0000 mg | ORAL_TABLET | Freq: Every day | ORAL | 4 refills | Status: AC

## 2024-01-06 NOTE — Progress Notes (Signed)
 Ph: [phone] Fax: [phone]   Patient ID: John Sanford, male    DOB: [birth year], 89 y.o.   MRN: [id number]  This visit was conducted in person.  BP 138/70   Pulse 76   Temp 97.8 F (36.6 C) (Oral)   Ht 5' 6.5" (1.689 m)   Wt 173 lb 4 oz (78.6 kg)   SpO2 96%   BMI 27.54 kg/m    CC: CPE Subjective:   HPI: John Sanford is a 89 y.o. male presenting on 01/06/2024 for Annual Exam (MCR prt 2 [AWV- 01/05/24]. Pt accompanied by daughter, Hospital doctor. )   Saw health advisor yesterday for medicare wellness visit. Note reviewed.  No results found.  Flowsheet Row Office Visit from 01/06/2024 in [facility]  PHQ-2 Total Score 3          01/06/2024   10:31 AM 01/05/2024   10:19 AM 01/04/2023   10:34 AM 12/31/2021   11:22 AM 12/30/2020   11:23 AM  Fall Risk   Falls in the past year? 1 1 0 0 0  Number falls in past yr: 0 0 0 0 0  Injury with Fall? 1 1 0 0 0  Risk for fall due to :  No Fall Risks No Fall Risks No Fall Risks Medication side effect  Follow up  Education provided;Falls prevention discussed Falls prevention discussed;Falls evaluation completed Falls evaluation completed Falls evaluation completed;Falls prevention discussed    Daughter requests we contact her or her brother to schedule appointments [phone]) [phone] Catering manager).   Children are now preparing weekly pill bottle to facilitate medication administration.   See prior note for details.  Recent hospitalization 10/2023 for fall from roof with resultant L superior and inferior pubic rami and sacral ala pelvic fractures as well as multiple L sided rib fractures complicated by presacral space hemorrhage s/p 1u pRBC transfusion. Underwent percutaneous pelvic fixation ([facility]).  Did not qualify for HHPT - planning to get set up with outpatient PT. Saw ortho in f/u - overall good report.  Has completed 1 month of aspirin  325mg  - but has kept taking.   Imaging showed slightly  spiculated 9x51mm RUL pulm nodule - rec rpt imaging in 3 months with either chest CT, PET-CT or tissue sampling.   Last visit we referred to audiology - new hearing aides! Doing well with this.  Notes some insomnia and hot flashes at night.   Preventative: Colon cancer screening - colonoscopy 2009 [facility]) - aged out. Denies blood in stool, bowel changes.  Prostate cancer screening - aged out. Nocturia. Stopped saw palmetto , continues flomax .  Lung cancer screening - aged out Flu shot - yearly COVID vaccine Pfizer 08/2019, 09/2019, no booster Td 2012 Pneumovax23 2011, 2019 prevnar-13 2015 Zostavax 2015  Shingrix - 12/2021, to check with pharmacy Advanced directive discussion - Has living will scanned and in chart 10/2016. Has health care POA---daughter then son. Would accept resuscitation--but no prolonged artificial ventilation. Would not want feeding tube if cognitively unaware.  Seat belt use discussed.  Sunscreen use discussed. No changing moles on skin. Has seen derm for h/o skin cancer to back.  Ex smoker - quit remotely at age 30yo, but 5 PPD smoker x ~20 yrs.  Alcohol - none Dentist - full dentures  Eye exam - yearly ([place]) Bowel - notes mild constipation with metformin , managed with colace + miralax  as well as magnesium . Cheese causes constipation.  Bladder - no incontinence  Widower - wife deceased 11/2006  Daughter lives nearby, son in [place] in [place]  Activity - limited after recent pelvic fracture Diet-      Relevant past medical, surgical, family and social history reviewed and updated as indicated. Interim medical history since our last visit reviewed. Allergies and medications reviewed and updated. Outpatient Medications Prior to Visit  Medication Sig Dispense Refill   ascorbic acid  (VITAMIN C ) 500 MG tablet Take 1 tablet (500 mg total) by mouth 2 (two) times daily.     cyanocobalamin  (VITAMIN B12) 1000 MCG tablet Take 1 tablet (1,000 mcg total)  by mouth daily.     Docusate Calcium (STOOL SOFTENER PO) Take by mouth as needed.     ferrous sulfate  325 (65 FE) MG tablet Take 1 tablet (325 mg total) by mouth 2 (two) times daily with a meal.     loratadine (CLARITIN) 10 MG tablet Take 10 mg by mouth daily.     MISC NATURAL PRODUCTS PO Take by mouth daily. Magnesium Triple Complex     polyethylene glycol powder (GLYCOLAX /MIRALAX ) 17 GM/SCOOP powder Take 8.5-17 g by mouth daily as needed for moderate constipation. (1/2-1 capful) (Patient taking differently: Take 8.5-17 g by mouth daily as needed for moderate constipation. (1/2-1 capful) Takes daily) 3350 g 1   pravastatin  (PRAVACHOL ) 80 MG tablet Take 1 tablet (80 mg total) by mouth daily. 90 tablet 4   Vitamin D , Ergocalciferol , (DRISDOL ) 1.25 MG (50000 UNIT) CAPS capsule Take 1 capsule (50,000 Units total) by mouth every 7 (seven) days.     amLODipine  (NORVASC ) 5 MG tablet Take 1 tablet (5 mg total) by mouth daily. 90 tablet 4   aspirin  EC 325 MG tablet Take 325 mg by mouth daily.     glipiZIDE  (GLUCOTROL  XL) 5 MG 24 hr tablet Take 1 tablet (5 mg total) by mouth daily with breakfast. 90 tablet 4   losartan  (COZAAR ) 25 MG tablet Take 1 tablet (25 mg total) by mouth daily. (Patient taking differently: Take 25 mg by mouth daily. As needed) 90 tablet 0   metFORMIN  (GLUCOPHAGE ) 500 MG tablet Take 1 tablet (500 mg total) by mouth 2 (two) times daily with a meal. 180 tablet 4   tamsulosin  (FLOMAX ) 0.4 MG CAPS capsule Take 1 capsule (0.4 mg total) by mouth daily. 90 capsule 4   acetaminophen  (TYLENOL ) 500 MG tablet Take 2 tablets (1,000 mg total) by mouth every 8 (eight) hours as needed for mild pain (pain score 1-3) or moderate pain (pain score 4-6).     No facility-administered medications prior to visit.     Per HPI unless specifically indicated in ROS section below Review of Systems  Constitutional:  Negative for activity change, appetite change, chills, fatigue, fever and unexpected weight  change.  HENT:  Negative for hearing loss.   Eyes:  Negative for visual disturbance.  Respiratory:  Negative for cough, chest tightness, shortness of breath and wheezing.   Cardiovascular:  Positive for leg swelling (mild). Negative for chest pain and palpitations.  Gastrointestinal:  Positive for constipation (chronic) and diarrhea (alternating). Negative for abdominal distention, abdominal pain, blood in stool, nausea and vomiting.  Genitourinary:  Negative for difficulty urinating and hematuria.  Musculoskeletal:  Negative for arthralgias, myalgias and neck pain.  Skin:  Negative for rash.  Neurological:  Negative for dizziness, seizures, syncope and headaches.  Hematological:  Negative for adenopathy. Does not bruise/bleed easily.  Psychiatric/Behavioral:  Negative for dysphoric mood. The patient is not nervous/anxious.  Objective:  BP 138/70   Pulse 76   Temp 97.8 F (36.6 C) (Oral)   Ht 5' 6.5" (1.689 m)   Wt 173 lb 4 oz (78.6 kg)   SpO2 96%   BMI 27.54 kg/m   Wt Readings from Last 3 Encounters:  01/06/24 173 lb 4 oz (78.6 kg)  01/05/24 169 lb (76.7 kg)  12/12/23 169 lb 2 oz (76.7 kg)      Physical Exam Vitals and nursing note reviewed.  Constitutional:      General: He is not in acute distress.    Appearance: Normal appearance. He is well-developed. He is not ill-appearing.     Comments: Ambulates with walker   HENT:     Head: Normocephalic and atraumatic.     Right Ear: Hearing, tympanic membrane, ear canal and external ear normal.     Left Ear: Hearing, tympanic membrane, ear canal and external ear normal.     Ears:     Comments:  Wearing hearing aides Mild wax to L canal    Mouth/Throat:     Mouth: Mucous membranes are moist.     Pharynx: Oropharynx is clear. No oropharyngeal exudate or posterior oropharyngeal erythema.  Eyes:     General: No scleral icterus.    Extraocular Movements: Extraocular movements intact.     Conjunctiva/sclera: Conjunctivae  normal.     Pupils: Pupils are equal, round, and reactive to light.  Neck:     Thyroid : No thyroid  mass or thyromegaly.     Vascular: Carotid bruit (bilateral) present.  Cardiovascular:     Rate and Rhythm: Normal rate and regular rhythm.     Pulses: Normal pulses.          Radial pulses are 2+ on the right side and 2+ on the left side.     Heart sounds: Murmur (2/6 systolic USB) heard.  Pulmonary:     Effort: Pulmonary effort is normal. No respiratory distress.     Breath sounds: Normal breath sounds. No wheezing, rhonchi or rales.  Abdominal:     General: Bowel sounds are normal. There is no distension.     Palpations: Abdomen is soft. There is no mass.     Tenderness: There is no abdominal tenderness. There is no guarding or rebound.     Hernia: No hernia is present.  Musculoskeletal:        General: Normal range of motion.     Cervical back: Normal range of motion and neck supple.     Right lower leg: Edema (tr) present.     Left lower leg: Edema (tr) present.  Lymphadenopathy:     Cervical: No cervical adenopathy.  Skin:    General: Skin is warm and dry.     Findings: No rash.  Neurological:     General: No focal deficit present.     Mental Status: He is alert and oriented to person, place, and time.  Psychiatric:        Mood and Affect: Mood normal.        Behavior: Behavior normal.        Thought Content: Thought content normal.        Judgment: Judgment normal.       Results for orders placed or performed in visit on 12/26/23  Vitamin B12   Collection Time: 12/26/23 10:12 AM  Result Value Ref Range   Vitamin B-12 665 211 - 911 pg/mL  Parathyroid  hormone, intact (no Ca)   Collection Time: 12/26/23  10:12 AM  Result Value Ref Range   PTH 30 16 - 77 pg/mL  Microalbumin / creatinine urine ratio   Collection Time: 12/26/23 10:12 AM  Result Value Ref Range   Microalb, Ur 0.9 0.0 - 1.9 mg/dL   Creatinine,U 16.1 mg/dL   Microalb Creat Ratio 12.7 0.0 - 30.0 mg/g   VITAMIN D  25 Hydroxy (Vit-D Deficiency, Fractures)   Collection Time: 12/26/23 10:12 AM  Result Value Ref Range   VITD 35.70 30.00 - 100.00 ng/mL  Phosphorus   Collection Time: 12/26/23 10:12 AM  Result Value Ref Range   Phosphorus 3.3 2.3 - 4.6 mg/dL  Comprehensive metabolic panel with GFR   Collection Time: 12/26/23 10:12 AM  Result Value Ref Range   Sodium 137 135 - 145 mEq/L   Potassium 5.0 3.5 - 5.1 mEq/L   Chloride 103 96 - 112 mEq/L   CO2 27 19 - 32 mEq/L   Glucose, Bld 136 (H) 70 - 99 mg/dL   BUN 22 6 - 23 mg/dL   Creatinine, Ser 0.96 0.40 - 1.50 mg/dL   Total Bilirubin 0.4 0.2 - 1.2 mg/dL   Alkaline Phosphatase 92 39 - 117 U/L   AST 14 0 - 37 U/L   ALT 11 0 - 53 U/L   Total Protein 6.3 6.0 - 8.3 g/dL   Albumin  3.9 3.5 - 5.2 g/dL   GFR 04.54 (L) >09.81 mL/min   Calcium 9.0 8.4 - 10.5 mg/dL  Lipid panel   Collection Time: 12/26/23 10:12 AM  Result Value Ref Range   Cholesterol 169 0 - 200 mg/dL   Triglycerides 191.4 0.0 - 149.0 mg/dL   HDL 78.29 >56.21 mg/dL   VLDL 30.8 0.0 - 65.7 mg/dL   LDL Cholesterol 88 0 - 99 mg/dL   Total CHOL/HDL Ratio 3    NonHDL 116.79    Lab Results  Component Value Date   HGBA1C 6.2 12/12/2023   Assessment & Plan:   Problem List Items Addressed This Visit     Encounter for general adult medical examination with abnormal findings - Primary (Chronic)   Preventative protocols reviewed and updated unless pt declined. Discussed healthy diet and lifestyle.       Advanced directives, counseling/discussion (Chronic)   Previously discussed. Living will scanned 2018      Type 2 diabetes mellitus with neurological manifestations, controlled (HCC)   Chronic, overall stable period on current regimen - continue.  Denies hypoglycemia      Relevant Medications   glipiZIDE  (GLUCOTROL  XL) 5 MG 24 hr tablet   losartan  (COZAAR ) 25 MG tablet   metFORMIN  (GLUCOPHAGE ) 500 MG tablet   aspirin  EC 81 MG tablet   Hyperlipidemia associated with  type 2 diabetes mellitus (HCC)   Chronic, overall stable period on pravastatin  - continue. The ASCVD Risk score (Arnett DK, et al., 2019) failed to calculate for the following reasons:   The 2019 ASCVD risk score is only valid for ages 73 to 68       Relevant Medications   amLODipine  (NORVASC ) 5 MG tablet   glipiZIDE  (GLUCOTROL  XL) 5 MG 24 hr tablet   losartan  (COZAAR ) 25 MG tablet   metFORMIN  (GLUCOPHAGE ) 500 MG tablet   aspirin  EC 81 MG tablet   Essential hypertension, benign   Chronic, stable period on antihypertensives- continue      Relevant Medications   amLODipine  (NORVASC ) 5 MG tablet   losartan  (COZAAR ) 25 MG tablet   aspirin  EC 81 MG tablet   Chronic  constipation   Discussed bowel regimen.       BPH without obstruction/lower urinary tract symptoms   Stable period on flomax  - continue.       Relevant Medications   tamsulosin  (FLOMAX ) 0.4 MG CAPS capsule   PAD (peripheral artery disease) (HCC)   Rec update carotid US  for known mild stenosis with significant bruits heard on exam, also with h/o R subclavian artery disturbed flow. He declines updated imaging at this time , desires to defer to next year.       Relevant Medications   amLODipine  (NORVASC ) 5 MG tablet   losartan  (COZAAR ) 25 MG tablet   aspirin  EC 81 MG tablet   CKD stage 3 due to type 2 diabetes mellitus (HCC)   Reviewed renal function over the years.  Recent hospitalization with acute on chronic kidney injury, kidney function now back to normal.  Encouraged good hydration status, optimal glycemic and blood pressure control, and avoiding nephrotoxic agents.       Relevant Medications   glipiZIDE  (GLUCOTROL  XL) 5 MG 24 hr tablet   losartan  (COZAAR ) 25 MG tablet   metFORMIN  (GLUCOPHAGE ) 500 MG tablet   aspirin  EC 81 MG tablet   Vitamin D  deficiency   Continue daily vit D replacement.       Secondary hyperparathyroidism of renal origin (HCC)   Rpt PTH normal.       Vitamin B12 deficiency    Continue daily vit B12 replacement.       Hearing loss   New hearing aides. Saw [facility])      Closed pelvic fracture [facility])   S/p percutaneous pelvic pinning.  Planning to start outpatient PT  Discussed avoiding overexerting himself.  Has completed 1+ month of aspirin  325mg  for DVT ppx - will drop dose to 81mg .       Pulmonary nodule less than 1 cm in diameter with moderate to high risk for malignant neoplasm   Discussed imaging study from hospitalization incidentally showing spiculated 8-33mm RUL pulm nodule. Remote smoker.  Will update CT chest for next month (3 months from prior)      Relevant Orders   CT CHEST WO CONTRAST     Meds ordered this encounter  Medications   amLODipine  (NORVASC ) 5 MG tablet    Sig: Take 1 tablet (5 mg total) by mouth daily.    Dispense:  90 tablet    Refill:  4   glipiZIDE  (GLUCOTROL  XL) 5 MG 24 hr tablet    Sig: Take 1 tablet (5 mg total) by mouth daily with breakfast.    Dispense:  90 tablet    Refill:  4   losartan  (COZAAR ) 25 MG tablet    Sig: Take 1 tablet (25 mg total) by mouth daily.    Dispense:  90 tablet    Refill:  4    Note new dose   metFORMIN  (GLUCOPHAGE ) 500 MG tablet    Sig: Take 1 tablet (500 mg total) by mouth 2 (two) times daily with a meal.    Dispense:  180 tablet    Refill:  4   tamsulosin  (FLOMAX ) 0.4 MG CAPS capsule    Sig: Take 1 capsule (0.4 mg total) by mouth daily.    Dispense:  90 capsule    Refill:  4   aspirin  EC 81 MG tablet    Sig: Take 1 tablet (81 mg total) by mouth daily. Swallow whole.    Orders Placed This Encounter  Procedures  CT CHEST WO CONTRAST    Standing Status:   Future    Expiration Date:   01/05/2025    Preferred imaging location?:   [facility]    Patient Instructions  Stop aspirin  325mg , restart 81mg  aspirin  daily.  I will order follow up chest CT for next month.  Continue current medicines.  Check with pharmacy about getting 2nd Shingrix shot.  Good to  see you today Return as needed or in 6 months for follow up visit   Follow up plan: Return in about 6 months (around 07/08/2024) for follow up visit.  Claire Crick, MD

## 2024-01-06 NOTE — Patient Instructions (Addendum)
 Stop aspirin  325mg , restart 81mg  aspirin  daily.  I will order follow up chest CT for next month.  Continue current medicines.  Check with pharmacy about getting 2nd Shingrix shot.  Good to see you today Return as needed or in 6 months for follow up visit

## 2024-01-07 DIAGNOSIS — Z9189 Other specified personal risk factors, not elsewhere classified: Secondary | ICD-10-CM | POA: Insufficient documentation

## 2024-01-07 DIAGNOSIS — R911 Solitary pulmonary nodule: Secondary | ICD-10-CM | POA: Insufficient documentation

## 2024-01-07 NOTE — Assessment & Plan Note (Addendum)
 Reviewed renal function over the years.  Recent hospitalization with acute on chronic kidney injury, kidney function now back to normal.  Encouraged good hydration status, optimal glycemic and blood pressure control, and avoiding nephrotoxic agents.

## 2024-01-07 NOTE — Assessment & Plan Note (Signed)
 New hearing aides. Saw [facility])

## 2024-01-07 NOTE — Assessment & Plan Note (Signed)
 Preventative protocols reviewed and updated unless pt declined. Discussed healthy diet and lifestyle.

## 2024-01-07 NOTE — Assessment & Plan Note (Signed)
Continue daily vit D replacement.  

## 2024-01-07 NOTE — Assessment & Plan Note (Signed)
 Discussed bowel regimen.   ?

## 2024-01-07 NOTE — Assessment & Plan Note (Signed)
 Discussed imaging study from hospitalization incidentally showing spiculated 8-65mm RUL pulm nodule. Remote smoker.  Will update CT chest for next month (3 months from prior)

## 2024-01-07 NOTE — Assessment & Plan Note (Signed)
 Rec update carotid US  for known mild stenosis with significant bruits heard on exam, also with h/o R subclavian artery disturbed flow. He declines updated imaging at this time , desires to defer to next year.

## 2024-01-07 NOTE — Assessment & Plan Note (Signed)
 Chronic, overall stable period on pravastatin  - continue. The ASCVD Risk score (Arnett DK, et al., 2019) failed to calculate for the following reasons:   The 2019 ASCVD risk score is only valid for ages 65 to 64

## 2024-01-07 NOTE — Assessment & Plan Note (Signed)
 Continue daily vit B12 replacement.

## 2024-01-07 NOTE — Assessment & Plan Note (Addendum)
 S/p percutaneous pelvic pinning.  Planning to start outpatient PT  Discussed avoiding overexerting himself.  Has completed 1+ month of aspirin  325mg  for DVT ppx - will drop dose to 81mg .

## 2024-01-07 NOTE — Assessment & Plan Note (Signed)
 Chronic, stable period on antihypertensives- continue

## 2024-01-07 NOTE — Assessment & Plan Note (Signed)
Stable period on flomax - continue.

## 2024-01-07 NOTE — Assessment & Plan Note (Addendum)
 Chronic, overall stable period on current regimen - continue.  Denies hypoglycemia

## 2024-01-07 NOTE — Assessment & Plan Note (Signed)
 Rpt PTH normal.

## 2024-01-07 NOTE — Assessment & Plan Note (Signed)
 Previously discussed. Living will scanned 2018

## 2024-01-11 ENCOUNTER — Ambulatory Visit: Attending: Student | Admitting: Physical Therapy

## 2024-01-11 DIAGNOSIS — Z8781 Personal history of (healed) traumatic fracture: Secondary | ICD-10-CM | POA: Insufficient documentation

## 2024-01-11 DIAGNOSIS — Z9181 History of falling: Secondary | ICD-10-CM | POA: Diagnosis not present

## 2024-01-11 NOTE — Therapy (Signed)
 OUTPATIENT PHYSICAL THERAPY LOWER EXTREMITY EVALUATION   Patient Name: John Sanford MRN: [id number] DOB:1932-11-28, 89 y.o., male Today's Date: 01/11/2024  END OF SESSION:  PT End of Session - 01/11/24 2203     Visit Number 1    Number of Visits 1    Authorization Type [employer]    Authorization - Visit Number 1    Authorization - Number of Visits 1    PT Start Time 1300    PT Stop Time 1345    PT Time Calculation (min) 45 min    Equipment Utilized During Treatment Gait belt    Activity Tolerance Patient tolerated treatment well             Past Medical History:  Diagnosis Date   Allergy    Arthritis    BPH (benign prostatic hypertrophy)    Cancer (HCC)    hx of skin cancer    Diabetes mellitus    type 2    Hyperlipidemia    Hypertension    Pneumonia    hx of    Past Surgical History:  Procedure Laterality Date   CATARACT EXTRACTION W/ INTRAOCULAR LENS IMPLANT     COLONOSCOPY     ORIF PELVIC FRACTURE WITH PERCUTANEOUS SCREWS Left 10/27/2023   Procedure: CLOSED REDUCTION, PELVIS, WITH PERCUTANEOUS FIXATION;  Surgeon: Laneta Pintos, MD;  Location: [facility];  Service: Orthopedics;  Laterality: Left;   PROSTATE BIOPSY     negative   TONSILLECTOMY AND ADENOIDECTOMY     TOTAL HIP ARTHROPLASTY Left 07/29/2020   Procedure: LEFT TOTAL HIP ARTHROPLASTY ANTERIOR APPROACH;  Surgeon: Dayne Even, MD;  Location: [facility];  Service: Orthopedics;  Laterality: Left;   VENTRAL HERNIA REPAIR     periumbilical   Patient Active Problem List   Diagnosis Date Noted   Pulmonary nodule less than 1 cm in diameter with moderate to high risk for malignant neoplasm 01/07/2024   Multiple rib fractures 12/13/2023   Closed pelvic fracture (HCC) 12/13/2023   Fall with injury 10/26/2023   Hearing loss 07/05/2023   Vitamin B12 deficiency 01/28/2023   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 01/14/2023   Lymphocytosis 01/05/2023   Abdominal bruit  01/05/2023   Vitamin D  deficiency 01/05/2023   Secondary hyperparathyroidism of renal origin (HCC) 01/05/2023   Medicare annual wellness visit, subsequent 01/07/2022   PAD (peripheral artery disease) (HCC) 01/07/2022   CKD stage 3 due to type 2 diabetes mellitus (HCC) 01/07/2022   Status post hip replacement, left 01/02/2021   Osteoarthritis of left hip 10/23/2019   Fatigue 05/17/2018   Sleep disturbance 03/21/2015   Actinic keratoses 09/20/2014   Advanced directives, counseling/discussion 09/20/2014   Encounter for general adult medical examination with abnormal findings 08/30/2012   BPH without obstruction/lower urinary tract symptoms 01/30/2010   Allergic rhinitis 04/30/2008   COLONIC POLYPS, BENIGN 09/04/2007   Chronic constipation 07/26/2007   Type 2 diabetes mellitus with neurological manifestations, controlled (HCC) 10/20/2006   Hyperlipidemia associated with type 2 diabetes mellitus (HCC) 10/20/2006   Essential hypertension, benign 10/20/2006    PCP: Dr. Claire Crick    REFERRING PROVIDER: Dr. Ernestina Headland Haddix   REFERRING DIAG: S38.1XXA (ICD-10-CM) - Pelvis, crush injury, initial encounter  THERAPY DIAG:  History of falling  History of pelvic fracture  Rationale for Evaluation and Treatment: Rehabilitation  ONSET DATE: 10/2023   SUBJECTIVE:   SUBJECTIVE STATEMENT: See pertinent history    PERTINENT HISTORY: Patient reports having surgery in March for  pelvic fracture after falling. He then had to have a revision in April of the surgery because a screw was moving. It has since stopped moving since April and he is now ready to begin physical therapy. He mainly uses a 2WW when walking around and he does not feel unsteady. He even describes taking additional times between position changes to avoid being unsteady.  PAIN:  Are you having pain? No  PRECAUTIONS: Other: Falls , pt reports that he was instructed not to climb steps and to not sleep on left side    RED  FLAGS: None   WEIGHT BEARING RESTRICTIONS: No  FALLS:  Has patient fallen in last 6 months? Yes. Number of falls 1, fell while fixing something on roof.   LIVING ENVIRONMENT: Living with daughter currently   Lives with: lives with their daughter Lives in: House/apartment Stairs: Yes: External: 2 steps; on right going up and on left going up Has following equipment at home: Otho Blitz - 2 wheeled  His home   Lives in: Split Level House/apartment   Stairs: 2 stairs with 2 flights with railings .   OCCUPATION: Retired    PLOF: Independent  PATIENT GOALS: He wants to be evaluated for falls risk.   NEXT MD VISIT: Scheduled at end of June, 2025   OBJECTIVE:  Note: Objective measures were completed at Evaluation unless otherwise noted.  DIAGNOSTIC FINDINGS: CLINICAL DATA:  Postop pelvic fracture fixation   EXAM: JUDET PELVIS - 3+ VIEW   COMPARISON:  10/26/2023, CT 10/25/2023   FINDINGS: Left hip replacement with normal alignment. Previous hernia repair. Interval single screw fixation across the sacrum and SI joints and placement of single fixating screw across the left superior pubic ramus. Comminuted superior and inferior pubic rami fractures on the left with mild residual displacement. Pubic symphysis appears intact. Moderate right hip degenerative changes.   IMPRESSION: Interval screw fixation across the sacrum and SI joints and left superior pubic ramus. Comminuted left superior and inferior pubic rami fractures with mild residual displacement.     Electronically Signed   By: Esmeralda Hedge M.D.   On: 10/27/2023 15:57  PATIENT SURVEYS:  ABC scale Not performed   COGNITION: Overall cognitive status: Within functional limits for tasks assessed     SENSATION: WFL  POSTURE: rounded shoulders, forward head, and increased thoracic kyphosis   FUNCTIONAL TESTS:  5 times sit to stand: 11 sec   30 seconds chair stand test: 10 reps   <8 for male 59-88 indicates  increased falls risk   10 meter walk test: 10.71 sec -10 M/10.71= 0.93 m/sec    - 0.8 - 1.3 m/sec - community ambulator - associated with increased independence in self-care    Dynamic Gait Index- Did not use single point cane   -Item 1: Gait level surface- Normal 3   -Item 2: Change in gait speed- Normal 3   -Item 3: Gait with horizontal head turns - Normal 3    -Item 4: Gait with vertical vertical turns- Normal 3  Item 5: Gait with pivot turn- Normal 3   -Item 6: Step Over Obstacle- Moderate 1   -Item 7: Step Around Obstacles- Normal 3   -Item 8: Steps- Mild Impairment 2  Total: 21/24     <= 19/24= predictive of falls in elderly     GAIT: Distance walked: 50 ft  Assistive device utilized: None Level of assistance: Complete Independence Comments: Decreased step length and stride length bilaterally and increased kyphotic posture  TREATMENT DATE:   01/11/24  Self Care Home Management  -Education on using [facility] in home to start and to use in RUE and then to transition to using it outside once he had been cleared by surgeon.  -Education about results of falls screening and encouragement to continue to remain physically active but not to overdo it.   PATIENT EDUCATION:  Education details: Explanation of scores for functional tests   Person educated: Patient Education method: Explanation, Demonstration, Verbal cues, and Handouts Education comprehension: verbalized understanding, returned demonstration, and verbal cues required  HOME EXERCISE PROGRAM: Continue to be physically active and begin to utilize single point cane in his home.  ASSESSMENT:  CLINICAL IMPRESSION: Patient is a 89 y.o. white male who was seen today for physical therapy evaluation and treatment for pelvic fracture with surgical repair in March with revision performed in April.  Pt has LE strength and endurance that are above age and gender matched norms and that do make him at risk for falls. He also has little dynamic balance deficits and he is above cut off for falls risk. He has no pain while weight bearing and he is safe with use of single point cane with no lateral deviation of loss of balance. He does not need additional physical therapy at this time given results of examination.   OBJECTIVE IMPAIRMENTS: decreased ROM and hypomobility.   ACTIVITY LIMITATIONS: lifting  PARTICIPATION LIMITATIONS: community activity and walking outdoors   PERSONAL FACTORS: Age and 1-2 comorbidities: HTN and T2DM are also affecting patient's functional outcome.   REHAB POTENTIAL: Excellent  CLINICAL DECISION MAKING: Stable/uncomplicated  EVALUATION COMPLEXITY: Low   GOALS: Goals reviewed with patient? Yes  SHORT TERM GOALS: Target date: 01/25/2024  Patient will demonstrate undestanding of when to use single point cane and demonstrate safe use of single point cane without verbal or tactile cues. Baseline: Able to demonstrate how to correctly use single point cane.  Goal status: INITIAL       PLAN: Not applicable. Eval only     Marge Shed PT, DPT  [facility] [address] [place], [address] Phone: [phone]   Fax:  [phone]

## 2024-01-18 ENCOUNTER — Ambulatory Visit: Admitting: Physical Therapy

## 2024-01-23 ENCOUNTER — Ambulatory Visit: Admitting: Physical Therapy

## 2024-01-25 ENCOUNTER — Ambulatory Visit: Admitting: Physical Therapy

## 2024-01-31 ENCOUNTER — Ambulatory Visit: Admitting: Physical Therapy

## 2024-01-31 DIAGNOSIS — S32811D Multiple fractures of pelvis with unstable disruption of pelvic ring, subsequent encounter for fracture with routine healing: Secondary | ICD-10-CM | POA: Diagnosis not present

## 2024-02-02 ENCOUNTER — Encounter: Admitting: Physical Therapy

## 2024-02-14 ENCOUNTER — Encounter: Admitting: Physical Therapy

## 2024-02-16 ENCOUNTER — Encounter: Admitting: Physical Therapy

## 2024-02-20 ENCOUNTER — Encounter: Admitting: Physical Therapy

## 2024-02-23 ENCOUNTER — Encounter: Admitting: Physical Therapy

## 2024-02-28 ENCOUNTER — Encounter: Admitting: Physical Therapy

## 2024-03-01 ENCOUNTER — Encounter: Admitting: Physical Therapy

## 2024-03-01 ENCOUNTER — Other Ambulatory Visit: Payer: Self-pay | Admitting: Family Medicine

## 2024-03-01 DIAGNOSIS — E1169 Type 2 diabetes mellitus with other specified complication: Secondary | ICD-10-CM

## 2024-03-06 ENCOUNTER — Encounter: Admitting: Physical Therapy

## 2024-03-08 ENCOUNTER — Encounter: Admitting: Physical Therapy

## 2024-03-12 ENCOUNTER — Encounter: Admitting: Physical Therapy

## 2024-03-14 ENCOUNTER — Encounter: Admitting: Physical Therapy

## 2024-04-03 DIAGNOSIS — S32811D Multiple fractures of pelvis with unstable disruption of pelvic ring, subsequent encounter for fracture with routine healing: Secondary | ICD-10-CM | POA: Diagnosis not present

## 2024-04-12 ENCOUNTER — Ambulatory Visit
Admission: RE | Admit: 2024-04-12 | Discharge: 2024-04-12 | Disposition: A | Discharge status: Home / Self Care | Source: Ambulatory Visit | Attending: Family Medicine | Admitting: Family Medicine

## 2024-04-12 DIAGNOSIS — J432 Centrilobular emphysema: Secondary | ICD-10-CM | POA: Diagnosis not present

## 2024-04-12 DIAGNOSIS — Z9189 Other specified personal risk factors, not elsewhere classified: Secondary | ICD-10-CM | POA: Diagnosis not present

## 2024-04-12 DIAGNOSIS — R918 Other nonspecific abnormal finding of lung field: Secondary | ICD-10-CM | POA: Diagnosis not present

## 2024-04-12 DIAGNOSIS — S2232XA Fracture of one rib, left side, initial encounter for closed fracture: Secondary | ICD-10-CM | POA: Diagnosis not present

## 2024-04-12 DIAGNOSIS — R911 Solitary pulmonary nodule: Secondary | ICD-10-CM | POA: Diagnosis not present

## 2024-04-16 ENCOUNTER — Other Ambulatory Visit: Payer: Self-pay | Admitting: Family Medicine

## 2024-04-16 DIAGNOSIS — I10 Essential (primary) hypertension: Secondary | ICD-10-CM

## 2024-04-17 ENCOUNTER — Ambulatory Visit: Payer: Self-pay | Admitting: Family Medicine

## 2024-04-17 DIAGNOSIS — R911 Solitary pulmonary nodule: Secondary | ICD-10-CM

## 2024-04-17 DIAGNOSIS — R011 Cardiac murmur, unspecified: Secondary | ICD-10-CM | POA: Insufficient documentation

## 2024-04-17 NOTE — Assessment & Plan Note (Signed)
 CT with aortic valve calcifications 03/2024 - eval for valve dysfunction

## 2024-04-17 NOTE — Telephone Encounter (Signed)
 Change in therapy. Per 12/12/23 lab result notes, pt was instructed to decrease losartan  to 25 mg daily. New rx sent 01/06/24, #90/4 refills to [facility].   Request denied.

## 2024-04-18 NOTE — Telephone Encounter (Signed)
 Copied from CRM #[id number]. Topic: Clinical - Lab/Test Results >> Apr 17, 2024  5:02 PM Dedra B wrote: Reason for CRM: Pt returning call for CT results. Pls call pt.

## 2024-04-18 NOTE — Telephone Encounter (Signed)
 Spoke with pt relaying CT results and Dr Talmadge message. Pt verbalizes understanding.

## 2024-04-27 ENCOUNTER — Ambulatory Visit: Payer: Self-pay

## 2024-04-27 NOTE — Telephone Encounter (Signed)
 FYI Only or Action Required?: Action required by provider: meclizine rx requests.  Patient was last seen in primary care on 01/06/2024 by Rilla Baller, MD.  Called Nurse Triage reporting Dizziness.  Symptoms began a week ago.  Interventions attempted: OTC medications: dramamine and Rest, hydration, or home remedies.  Symptoms are: unchanged.  Triage Disposition: See PCP When Office is Open (Within 3 Days)  Patient/caregiver understands and will follow disposition?: No, wishes to speak with PCP  Copied from CRM #[id number]. Topic: Clinical - Red Word Triage >> Apr 27, 2024  3:45 PM Drema MATSU wrote: Red Word that prompted transfer to Nurse Triage: Patient has vertigo and needs a prescription for it. Symptoms are lightheaded and vomiting. Reason for Disposition  [1] MILD dizziness (e.g., vertigo; walking normally) AND [2] has NOT been evaluated by doctor (or NP/PA) for this  Answer Assessment - Initial Assessment Questions Additional info: Refused appointment. [facility]ing [facility]    1. DESCRIPTION: Describe your dizziness.     spinning 2. VERTIGO: Do you feel like either you or the room is spinning or tilting?      yes 3. LIGHTHEADED: Do you feel lightheaded? (e.g., somewhat faint, woozy, weak upon standing)     no 4. SEVERITY: How bad is it?  Can you walk?     Able to walk  5. ONSET:  When did the dizziness begin?     Last week on Thursday, 8 days ago 6. AGGRAVATING FACTORS: Does anything make it worse? (e.g., standing, change in head position)     position 7. CAUSE: What do you think is causing the dizziness?     vertigo 8. RECURRENT SYMPTOM: Have you had dizziness before? If Yes, ask: When was the last time? What happened that time?     Yes, recurring for 3 years. Used to take meclizine 9. OTHER SYMPTOMS: Do you have any other symptoms? (e.g., earache, headache, numbness, tinnitus, vomiting, weakness)     Denies all other  symptoms.  Protocols used: Dizziness - Vertigo-A-AH

## 2024-05-11 ENCOUNTER — Other Ambulatory Visit: Payer: Self-pay | Admitting: Family Medicine

## 2024-05-11 ENCOUNTER — Ambulatory Visit: Payer: Self-pay

## 2024-05-11 MED ORDER — MECLIZINE HCL 25 MG PO TABS: 25.0000 mg | ORAL_TABLET | Freq: Two times a day (BID) | ORAL | 0 refills | Status: DC | PRN

## 2024-05-11 NOTE — Progress Notes (Signed)
 ERx meclizine 

## 2024-05-11 NOTE — Telephone Encounter (Signed)
 FYI Only or Action Required?: Action required by provider: clinical question for provider.  Patient was last seen in primary care on 01/06/2024 by Rilla Baller, MD.  Called Nurse Triage reporting Dizziness.  Symptoms began several weeks ago.  Interventions attempted: OTC medications: Dramamine.  Symptoms are: stable.  Triage Disposition: See Physician Within 24 Hours  Patient/caregiver understands and will follow disposition?: No, refuses disposition  Pt is requesting meclizine . States he has had vertigo off and on for over 20 years and this helped in the past.     Copied from CRM #[id number]. Topic: Clinical - Red Word Triage >> May 11, 2024 11:46 AM Franky GRADE wrote: Red Word that prompted transfer to Nurse Triage: Patient is experiencing vertigo, Patient called last week and symptoms have not improved, Still feeling dizzy  and out of balance. Reason for Disposition  [1] MODERATE dizziness (e.g., vertigo; feels very unsteady, interferes with normal activities) AND [2] has NOT been evaluated by doctor (or NP/PA) for this  Answer Assessment - Initial Assessment Questions Pt has a hx of vertigo and states it feels like similar events. He states meclizine  has worked in the past.      1. DESCRIPTION: Describe your dizziness.     Vertigo 2. VERTIGO: Do you feel like either you or the room is spinning or tilting?      Continues to say vertigo  4. SEVERITY: How bad is it?  Can you walk?     Able to walk 5. ONSET:  When did the dizziness begin?     On and off for the last 20 years. This recent event has been on and off for the last 3 weeks.  6. AGGRAVATING FACTORS: Does anything make it worse? (e.g., standing, change in head position)     Looking up or looking down makes it worse 7. CAUSE: What do you think is causing the dizziness?     Hx of vertigo  8. RECURRENT SYMPTOM: Have you had dizziness before? If Yes, ask: When was the last time? What happened  that time?     Yes. States sometimes its a year or 2 without any symptoms 9. OTHER SYMPTOMS: Do you have any other symptoms? (e.g., earache, headache, numbness, tinnitus, vomiting, weakness)     no  Protocols used: Dizziness - Vertigo-A-AH

## 2024-05-11 NOTE — Telephone Encounter (Signed)
 Called patient and reviewed all information. He will make appointment if any of symptoms below.

## 2024-05-11 NOTE — Telephone Encounter (Addendum)
 Can we get an answer to my question from earlier in the month?

## 2024-05-11 NOTE — Telephone Encounter (Signed)
 I have not had functioning wifi so was not able to review message until now.   Reviewing chart, he called on 04/27/2024 for same issue, I don't see where this was sent to me for review.   Please notify I have sent meclizine  to his pharmacy. If not improving with this, he will need office visit. Please also verify his episodes of vertigo only last seconds to minutes. If longer than this, I recommend office visit for evaluation.

## 2024-07-04 ENCOUNTER — Other Ambulatory Visit: Payer: Self-pay | Admitting: Family Medicine

## 2024-07-04 ENCOUNTER — Ambulatory Visit: Payer: Self-pay

## 2024-07-04 MED ORDER — MECLIZINE HCL 25 MG PO TABS: 12.5000 mg | ORAL_TABLET | Freq: Two times a day (BID) | ORAL | 0 refills | Status: DC | PRN

## 2024-07-04 NOTE — Telephone Encounter (Signed)
 Will see on Friday Meclizine  last filled 04/2024.  Refilled - but he needs to be very cautious with use as it can increase fall risk due to causing unsteadiness/sedation. Take 1/2 tab at a time.

## 2024-07-04 NOTE — Progress Notes (Signed)
 ERx meclizine 

## 2024-07-04 NOTE — Telephone Encounter (Signed)
 Patient fell Monday afternoon --gets vertigo quite often --was on the ground working with a lawnmower --he told his daughter John Sanford that he got up, started walking, did not have any usual vertigo symptoms but he felt wobble headed and lost his balance and fell Patient fell in March off a well house roof and had major injuries--internal bleeding, rods put in (per daughter)  Patient complained of being sore on his left side from his waist upwards  Vertigo in the spring and fall usually for 2-3 weeks but this current bout of vertigo is going on longer according to the daughter--She wanted to know if an ENT referral would be appropriate  Daughter wanted this RN to call the patient to advise him and assess him This RN attempted to contact patient and no answer---left a voicemail               Copied from CRM [id number]. Topic: Clinical - Red Word Triage >> Jul 04, 2024  9:20 AM Macario HERO wrote: Red Word that prompted transfer to Nurse Triage: Patient daughter called said he had a vertigo spell and fell on Monday. Stated he does not want to come to the doctor but she is requesting a nurse to call him and check on him. Reason for Disposition  Patient sounds very sick or weak to the triager  Answer Assessment - Initial Assessment Questions Patient fell Monday afternoon--unwitnessed --gets vertigo quite often --was on the ground working with a lawnmower --he told his daughter John Sanford that he got up, started walking, did not have any usual vertigo symptoms but he felt wobble headed and lost his balance and fell Patient fell in March off a well house roof and had major injuries--internal bleeding, rods put in (per daughter)  Patient complained of being sore on his left side from his waist upwards  Vertigo in the spring and fall usually for 2-3 weeks but this current bout of vertigo is going on longer according to the daughter--She wanted to know if an ENT referral would be  appropriate  Daughter wanted this RN to call the patient to advise him and assess him This RN attempted to call the patient and no answer--left a voicemail  Protocols used: Neurologic Deficit-A-AH

## 2024-07-04 NOTE — Telephone Encounter (Signed)
 FYI Only or Action Required?: FYI only for provider: appointment scheduled on 07/06/2024.: and patient would like to request refill for meclizine   Patient was last seen in primary care on 01/06/2024 by Rilla Baller, MD.  Called Nurse Triage reporting Fall.  Symptoms began Monday.  Interventions attempted: Nothing.  Symptoms are: stable.  Triage Disposition: See PCP When Office is Open (Within 3 Days)  Patient/caregiver understands and will follow disposition?: Yes     Reason for Disposition  MILD weakness (e.g., does not interfere with ability to work, go to school, normal activities)  (Exception: Mild weakness is a chronic symptom.)  Answer Assessment - Initial Assessment Questions 1. MECHANISM: How did the fall happen?     On ground fixing lawn mower and stood up: no lightheadedness or dizziness then just fell 2. DOMESTIC VIOLENCE AND ELDER ABUSE SCREENING: Did you fall because someone pushed you or tried to hurt you? If Yes, ask: Are you safe now?     no 3. ONSET: When did the fall happen? (e.g., minutes, hours, or days ago)     Monday 4. LOCATION: What part of the body hit the ground? (e.g., back, buttocks, head, hips, knees, hands, head, stomach)         Left hand scrape skin, left side/rib area 5. INJURY: Did you hurt (injure) yourself when you fell? If Yes, ask: What did you injure? Tell me more about this? (e.g., body area; type of injury; pain severity)       Left hand scrape skin, left side/rib area 6. PAIN: Is there any pain? If Yes, ask: How bad is the pain? (e.g., Scale 0-10; or none, mild,      soreness 7. SIZE: For cuts, bruises, or swelling, ask: How large is it? (e.g., inches or centimeters)      Left hand 8. PREGNANCY: Is there any chance you are pregnant? When was your last menstrual period?     na 9. OTHER SYMPTOMS: Do you have any other symptoms? (e.g., dizziness, fever, weakness; new-onset or worsening).      na 10. CAUSE:  What do you think caused the fall (or falling)? (e.g., dizzy spell, tripped)       [facility] and spoke with pt: no visible bruises, soreness to left side / rib area, no SOB.  Pt has a history of vertigo without dizziness & describes this is what happened on Monday: no other falls since Monday.  Pt stated no head injury.  Protocols used: Falls and [facility]

## 2024-07-05 NOTE — Telephone Encounter (Signed)
 Left message to return call to our office.  Please provide  message from provider/office when call is returned from patient.

## 2024-07-06 ENCOUNTER — Encounter: Payer: Self-pay | Admitting: Family Medicine

## 2024-07-06 ENCOUNTER — Ambulatory Visit
Admission: RE | Admit: 2024-07-06 | Discharge: 2024-07-06 | Disposition: A | Discharge status: Home / Self Care | Source: Ambulatory Visit | Attending: Family Medicine | Admitting: Family Medicine

## 2024-07-06 ENCOUNTER — Ambulatory Visit (INDEPENDENT_AMBULATORY_CARE_PROVIDER_SITE_OTHER): Admitting: Family Medicine

## 2024-07-06 VITALS — BP 122/58 | HR 77 | Temp 97.9°F | Ht 66.5 in | Wt 174.4 lb

## 2024-07-06 DIAGNOSIS — R42 Dizziness and giddiness: Secondary | ICD-10-CM | POA: Insufficient documentation

## 2024-07-06 DIAGNOSIS — R0789 Other chest pain: Secondary | ICD-10-CM | POA: Insufficient documentation

## 2024-07-06 DIAGNOSIS — S2242XA Multiple fractures of ribs, left side, initial encounter for closed fracture: Secondary | ICD-10-CM | POA: Diagnosis not present

## 2024-07-06 DIAGNOSIS — I700 Atherosclerosis of aorta: Secondary | ICD-10-CM | POA: Diagnosis not present

## 2024-07-06 NOTE — Assessment & Plan Note (Signed)
 Chronic issue present for years, recurrent over the past several months - dix hallpike maneuvers negative today suggesting against BPPV, story not consistent with stroke cause. No recent URI. ?meniere's (with recent drop attack) although denies tinnitus. Has been using meclizine  consistently as management strategy - reviewed risks of ongoing meclizine  use.  Daughter mentions strong fmhx vertigo including a nephew who needed to have vestibular nerve ablation.  Recommended further evaluation by [facility] - referral placed today.

## 2024-07-06 NOTE — Telephone Encounter (Signed)
 Patient was seem in the office today.

## 2024-07-06 NOTE — Patient Instructions (Addendum)
 Left ribcage xray today  I'd like to refer you to the ENT ear nose and throat doctor for further evaluation of vertigo.  Good to see you today

## 2024-07-06 NOTE — Progress Notes (Unsigned)
 Ph: [phone] Fax: [phone]   Patient ID: John Sanford, male    DOB: Apr 26, 1933, 89 y.o.   MRN: [id number]  This visit was conducted in person.  BP (!) 122/58   Pulse 77   Temp 97.9 F (36.6 C) (Oral)   Ht 5' 6.5 (1.689 m)   Wt 174 lb 6 oz (79.1 kg)   SpO2 98%   BMI 27.72 kg/m   BP Readings from Last 3 Encounters:  07/06/24 (!) 122/58  01/06/24 138/70  12/12/23 130/64    CC: follow up fall  Subjective:   HPI: John Sanford is a 89 y.o. male presenting on 07/06/2024 for Medical Management of Chronic Issues (Pt here due to a fall that happened 07/02/24. Pt in juried his left hand and says his ribs hurt on the left side. Pt accompanied by his daughter Amber/)   DOI: 07/02/2024 Monday at 3pm was taking blades off lawnmower. When he got up, he thinks he tripped over tree roots and fell down onto left side. He feels he's aggravated left lateral ribcage - same side he had a rib fracture.  He notes symptoms are much better over the past 5 days (75% better).   Chronic vertigo started at age 5s, then stopped for 17 yrs. This year 04/2024 he again developed episodes of vertigo predominantly in the mornings when he wakes up, managed with meclizine .  + n/v x1.  No noted tinnitus. Chronic hearing loss, wears hearing aides.   Sister has h/o vertigo issues, and nephew had to have vestibular nerve ablation due to vertigo.   No vision changes, unilateral numbness/weakness, slurred speech, headache.   Prior fall s/p hospitalization 10/2023 for fall from roof with resultant L superior and inferior pubic rami and sacral ala pelvic fractures as well as multiple L sided rib fractures complicated by presacral space hemorrhage s/p 1u pRBC transfusion. Underwent percutaneous pelvic fixation ([facility]).   At that time did not qualify for HHPT - he did undergo outpatient PT evaluation - D/C'd after initial evaluation as he met all measurable goals.   Chest CT 10/2023 had  nodules RUL, LLL - rpt CT chest 04/12/2024 overall unchanged, consider rpt CT 6-46mo. She also had subacute fracture of posterior L 11th rib.      Relevant past medical, surgical, family and social history reviewed and updated as indicated. Interim medical history since our last visit reviewed. Allergies and medications reviewed and updated. Outpatient Medications Prior to Visit  Medication Sig Dispense Refill   amLODipine  (NORVASC ) 5 MG tablet Take 1 tablet (5 mg total) by mouth daily. 90 tablet 4   ascorbic acid  (VITAMIN C ) 500 MG tablet Take 1 tablet (500 mg total) by mouth 2 (two) times daily.     aspirin  EC 81 MG tablet Take 1 tablet (81 mg total) by mouth daily. Swallow whole.     cyanocobalamin  (VITAMIN B12) 1000 MCG tablet Take 1 tablet (1,000 mcg total) by mouth daily.     glipiZIDE  (GLUCOTROL  XL) 5 MG 24 hr tablet Take 1 tablet (5 mg total) by mouth daily with breakfast. 90 tablet 4   loratadine (CLARITIN) 10 MG tablet Take 10 mg by mouth daily.     meclizine  (ANTIVERT ) 25 MG tablet Take 0.5-1 tablets (12.5-25 mg total) by mouth 2 (two) times daily as needed for dizziness. 20 tablet 0   metFORMIN  (GLUCOPHAGE ) 500 MG tablet Take 1 tablet (500 mg total) by mouth 2 (two) times daily with a meal. 180 tablet  4   polyethylene glycol powder (GLYCOLAX /MIRALAX ) 17 GM/SCOOP powder Take 8.5-17 g by mouth daily as needed for moderate constipation. (1/2-1 capful) (Patient taking differently: Take 8.5-17 g by mouth daily as needed for moderate constipation. (1/2-1 capful) Takes daily) 3350 g 1   pravastatin  (PRAVACHOL ) 80 MG tablet TAKE 1 TABLET BY MOUTH EVERY DAY 90 tablet 3   tamsulosin  (FLOMAX ) 0.4 MG CAPS capsule Take 1 capsule (0.4 mg total) by mouth daily. 90 capsule 4   Vitamin D , Ergocalciferol , (DRISDOL ) 1.25 MG (50000 UNIT) CAPS capsule Take 1 capsule (50,000 Units total) by mouth every 7 (seven) days.     Docusate Calcium (STOOL SOFTENER PO) Take by mouth as needed. (Patient not taking:  Reported on 07/06/2024)     ferrous sulfate  325 (65 FE) MG tablet Take 1 tablet (325 mg total) by mouth 2 (two) times daily with a meal. (Patient not taking: Reported on 07/06/2024)     losartan  (COZAAR ) 25 MG tablet Take 1 tablet (25 mg total) by mouth daily. (Patient not taking: Reported on 07/06/2024) 90 tablet 4   MISC NATURAL PRODUCTS PO Take by mouth daily. Magnesium Triple Complex (Patient not taking: Reported on 07/06/2024)     No facility-administered medications prior to visit.     Per HPI unless specifically indicated in ROS section below Review of Systems  Objective:  BP (!) 122/58   Pulse 77   Temp 97.9 F (36.6 C) (Oral)   Ht 5' 6.5 (1.689 m)   Wt 174 lb 6 oz (79.1 kg)   SpO2 98%   BMI 27.72 kg/m   Wt Readings from Last 3 Encounters:  07/06/24 174 lb 6 oz (79.1 kg)  01/06/24 173 lb 4 oz (78.6 kg)  01/05/24 169 lb (76.7 kg)      Physical Exam    Results for orders placed or performed in visit on 12/26/23  Vitamin B12   Collection Time: 12/26/23 10:12 AM  Result Value Ref Range   Vitamin B-12 665 211 - 911 pg/mL  Parathyroid  hormone, intact (no Ca)   Collection Time: 12/26/23 10:12 AM  Result Value Ref Range   PTH 30 16 - 77 pg/mL  Microalbumin / creatinine urine ratio   Collection Time: 12/26/23 10:12 AM  Result Value Ref Range   Microalb, Ur 0.9 0.0 - 1.9 mg/dL   Creatinine,U 32.6 mg/dL   Microalb Creat Ratio 12.7 0.0 - 30.0 mg/g  VITAMIN D  25 Hydroxy (Vit-D Deficiency, Fractures)   Collection Time: 12/26/23 10:12 AM  Result Value Ref Range   VITD 35.70 30.00 - 100.00 ng/mL  Phosphorus   Collection Time: 12/26/23 10:12 AM  Result Value Ref Range   Phosphorus 3.3 2.3 - 4.6 mg/dL  Comprehensive metabolic panel with GFR   Collection Time: 12/26/23 10:12 AM  Result Value Ref Range   Sodium 137 135 - 145 mEq/L   Potassium 5.0 3.5 - 5.1 mEq/L   Chloride 103 96 - 112 mEq/L   CO2 27 19 - 32 mEq/L   Glucose, Bld 136 (H) 70 - 99 mg/dL   BUN 22 6 - 23  mg/dL   Creatinine, Ser 8.75 0.40 - 1.50 mg/dL   Total Bilirubin 0.4 0.2 - 1.2 mg/dL   Alkaline Phosphatase 92 39 - 117 U/L   AST 14 0 - 37 U/L   ALT 11 0 - 53 U/L   Total Protein 6.3 6.0 - 8.3 g/dL   Albumin  3.9 3.5 - 5.2 g/dL   GFR 48.03 (L) >39.99  mL/min   Calcium 9.0 8.4 - 10.5 mg/dL  Lipid panel   Collection Time: 12/26/23 10:12 AM  Result Value Ref Range   Cholesterol 169 0 - 200 mg/dL   Triglycerides 857.9 0.0 - 149.0 mg/dL   HDL 47.39 >60.99 mg/dL   VLDL 71.5 0.0 - 59.9 mg/dL   LDL Cholesterol 88 0 - 99 mg/dL   Total CHOL/HDL Ratio 3    NonHDL 116.79     Assessment & Plan:   Problem List Items Addressed This Visit     Vertigo - Primary   Chronic issue for years  Has taken meclizine  in am as management strategy.       Relevant Orders   Ambulatory referral to ENT   Other Visit Diagnoses       Rib pain on left side       Relevant Orders   DG Ribs Unilateral W/Chest Left        No orders of the defined types were placed in this encounter.   Orders Placed This Encounter  Procedures   DG Ribs Unilateral W/Chest Left    Standing Status:   Future    Expiration Date:   01/03/2025    Reason for Exam (SYMPTOM  OR DIAGNOSIS REQUIRED):   left lateral point ribcage pain after fall    Preferred imaging location?:   [facility]   Ambulatory referral to ENT    Referral Priority:   Routine    Referral Reason:   Specialty Services Required    Requested Specialty:   Otolaryngology    Number of Visits Requested:   1    Patient Instructions  Left ribcage xray today  I'd like to refer you to the ENT ear nose and throat doctor for further evaluation of vertigo.  Good to see you today   Follow up plan: Return if symptoms worsen or fail to improve.  Anton Blas, MD

## 2024-07-07 ENCOUNTER — Encounter: Payer: Self-pay | Admitting: Family Medicine

## 2024-07-07 NOTE — Assessment & Plan Note (Addendum)
 Subacute fracture of posterior L 11th rib on chest CT 03/2024.  Recent fall with re-injury to this area concern for recurrent fracture however given quick improvement, possibly more rib contusion/sprain. Regardless, will update rib xrays today, discussed conservative measures including using cushion as buffer.

## 2024-07-14 ENCOUNTER — Ambulatory Visit: Payer: Self-pay | Admitting: Family Medicine

## 2024-07-17 ENCOUNTER — Ambulatory Visit: Admitting: Family Medicine

## 2024-07-17 ENCOUNTER — Encounter: Payer: Self-pay | Admitting: Family Medicine

## 2024-07-17 VITALS — BP 124/70 | HR 71 | Temp 98.5°F | Ht 66.5 in | Wt 171.0 lb

## 2024-07-17 DIAGNOSIS — E1149 Type 2 diabetes mellitus with other diabetic neurological complication: Secondary | ICD-10-CM | POA: Diagnosis not present

## 2024-07-17 DIAGNOSIS — Z7984 Long term (current) use of oral hypoglycemic drugs: Secondary | ICD-10-CM

## 2024-07-17 DIAGNOSIS — Z23 Encounter for immunization: Secondary | ICD-10-CM | POA: Diagnosis not present

## 2024-07-17 DIAGNOSIS — R42 Dizziness and giddiness: Secondary | ICD-10-CM

## 2024-07-17 DIAGNOSIS — H9391 Unspecified disorder of right ear: Secondary | ICD-10-CM | POA: Insufficient documentation

## 2024-07-17 LAB — POCT GLYCOSYLATED HEMOGLOBIN (HGB A1C): Hemoglobin A1C: 6 % — AB (ref 4.0–5.6)

## 2024-07-17 MED ORDER — MECLIZINE HCL 25 MG PO TABS: 12.5000 mg | ORAL_TABLET | Freq: Two times a day (BID) | ORAL | 0 refills | Status: AC | PRN

## 2024-07-17 NOTE — Progress Notes (Signed)
 Ph: [phone] Fax: [phone]   Patient ID: John Sanford, male    DOB: 02/24/1933, 89 y.o.   MRN: [id number]  This visit was conducted in person.  BP 124/70   Pulse 71   Temp 98.5 F (36.9 C) (Oral)   Ht 5' 6.5 (1.689 m)   Wt 171 lb (77.6 kg)   SpO2 99%   BMI 27.19 kg/m    CC: DM f/u visit  Subjective:   HPI: John Sanford is a 89 y.o. male presenting on 07/17/2024 for Medical Management of Chronic Issues (Follow up on management of type 2 diabetes, pt has no concerns at this time)   See prior note for details.  Episodes of vertigo r/o meniere's - ENT pending. # provided to call and schedule appt. No recurrent episodes since last visit.   Did have a fall  Rib xrays did show displaced fractures of 9th and 10th left ribs - different from old 11th rib fracture.  Hopeful for improvement with supportive measures alone - recommend pain control, carry cushion with him for chest wall support, use incentive spirometer from previous. Recommend deep breathing for pulmonary support and to prevent complications, and watch for signs of worsening breathing such as shortness of breath, new cough, fever, chills or other signs of pneumonia.  Reviewed all this with patient.   DM - does not regularly check sugars. Compliant with antihyperglycemic regimen which includes: glipizide  XL 5mg  daily with breakfast, metformin  500mg  bid. Denies low sugars or hypoglycemic symptoms. Denies paresthesias, blurry vision. Last diabetic eye exam 11/2022. Glucometer brand: doesn't use regularly. Last foot exam: 06/2023 - DUE. DSME: seen remotely. Lab Results  Component Value Date   HGBA1C 6.0 (A) 07/17/2024   Diabetic Foot Exam - Simple   Simple Foot Form Diabetic Foot exam was performed with the following findings: Yes 07/17/2024 11:34 AM  Visual Inspection No deformities, no ulcerations, no other skin breakdown bilaterally: Yes Sensation Testing Intact to touch and monofilament testing  bilaterally: Yes Pulse Check Posterior Tibialis and Dorsalis pulse intact bilaterally: Yes Comments No claudication    Lab Results  Component Value Date   MICROALBUR 0.9 12/26/2023        Relevant past medical, surgical, family and social history reviewed and updated as indicated. Interim medical history since our last visit reviewed. Allergies and medications reviewed and updated. Outpatient Medications Prior to Visit  Medication Sig Dispense Refill   amLODipine  (NORVASC ) 5 MG tablet Take 1 tablet (5 mg total) by mouth daily. 90 tablet 4   ascorbic acid  (VITAMIN C ) 500 MG tablet Take 1 tablet (500 mg total) by mouth 2 (two) times daily.     aspirin  EC 81 MG tablet Take 1 tablet (81 mg total) by mouth daily. Swallow whole.     cyanocobalamin  (VITAMIN B12) 1000 MCG tablet Take 1 tablet (1,000 mcg total) by mouth daily.     ferrous sulfate  325 (65 FE) MG tablet Take 1 tablet (325 mg total) by mouth 2 (two) times daily with a meal. (Patient taking differently: Take 325 mg by mouth daily with breakfast.)     loratadine (CLARITIN) 10 MG tablet Take 10 mg by mouth daily.     losartan  (COZAAR ) 25 MG tablet Take 1 tablet (25 mg total) by mouth daily. 90 tablet 4   metFORMIN  (GLUCOPHAGE ) 500 MG tablet Take 1 tablet (500 mg total) by mouth 2 (two) times daily with a meal. 180 tablet 4   MISC NATURAL PRODUCTS PO Take  by mouth daily. Magnesium Triple Complex     polyethylene glycol powder (GLYCOLAX /MIRALAX ) 17 GM/SCOOP powder Take 8.5-17 g by mouth daily as needed for moderate constipation. (1/2-1 capful) (Patient taking differently: Take 8.5-17 g by mouth daily as needed for moderate constipation. (1/2-1 capful) Takes daily) 3350 g 1   pravastatin  (PRAVACHOL ) 80 MG tablet TAKE 1 TABLET BY MOUTH EVERY DAY 90 tablet 3   tamsulosin  (FLOMAX ) 0.4 MG CAPS capsule Take 1 capsule (0.4 mg total) by mouth daily. 90 capsule 4   Vitamin D , Ergocalciferol , (DRISDOL ) 1.25 MG (50000 UNIT) CAPS capsule Take 1  capsule (50,000 Units total) by mouth every 7 (seven) days.     glipiZIDE  (GLUCOTROL  XL) 5 MG 24 hr tablet Take 1 tablet (5 mg total) by mouth daily with breakfast. 90 tablet 4   meclizine  (ANTIVERT ) 25 MG tablet Take 0.5-1 tablets (12.5-25 mg total) by mouth 2 (two) times daily as needed for dizziness. 20 tablet 0   Docusate Calcium (STOOL SOFTENER PO) Take by mouth as needed. (Patient not taking: Reported on 07/17/2024)     No facility-administered medications prior to visit.     Per HPI unless specifically indicated in ROS section below Review of Systems  Objective:  BP 124/70   Pulse 71   Temp 98.5 F (36.9 C) (Oral)   Ht 5' 6.5 (1.689 m)   Wt 171 lb (77.6 kg)   SpO2 99%   BMI 27.19 kg/m   Wt Readings from Last 3 Encounters:  07/17/24 171 lb (77.6 kg)  07/06/24 174 lb 6 oz (79.1 kg)  01/06/24 173 lb 4 oz (78.6 kg)      Physical Exam Vitals and nursing note reviewed.  Constitutional:      Appearance: Normal appearance. He is not ill-appearing.  HENT:     Head: Normocephalic and atraumatic.      Mouth/Throat:     Mouth: Mucous membranes are moist.     Pharynx: Oropharynx is clear. No oropharyngeal exudate or posterior oropharyngeal erythema.  Eyes:     Extraocular Movements: Extraocular movements intact.     Conjunctiva/sclera: Conjunctivae normal.     Pupils: Pupils are equal, round, and reactive to light.  Cardiovascular:     Rate and Rhythm: Normal rate and regular rhythm.     Pulses: Normal pulses.     Heart sounds: Normal heart sounds. No murmur heard. Pulmonary:     Effort: Pulmonary effort is normal. No respiratory distress.     Breath sounds: Normal breath sounds. No wheezing, rhonchi or rales.  Musculoskeletal:     Right lower leg: No edema.     Left lower leg: No edema.     Comments: See HPI for foot exam if done  Skin:    General: Skin is warm and dry.     Findings: Lesion present. No rash.     Comments: Hyperkeratotic papule anterior to right ear    Neurological:     Mental Status: He is alert.  Psychiatric:        Mood and Affect: Mood normal.        Behavior: Behavior normal.       Results for orders placed or performed in visit on 07/17/24  POCT glycosylated hemoglobin (Hb A1C)   Collection Time: 07/17/24 11:09 AM  Result Value Ref Range   Hemoglobin A1C 6.0 (A) 4.0 - 5.6 %   HbA1c POC (<> result, manual entry)     HbA1c, POC (prediabetic range)  HbA1c, POC (controlled diabetic range)     Lab Results  Component Value Date   NA 137 12/26/2023   CL 103 12/26/2023   K 5.0 12/26/2023   CO2 27 12/26/2023   BUN 22 12/26/2023   CREATININE 1.24 12/26/2023   GFR 51.96 (L) 12/26/2023   CALCIUM 9.0 12/26/2023   PHOS 3.3 12/26/2023   ALBUMIN  3.9 12/26/2023   GLUCOSE 136 (H) 12/26/2023   Lab Results  Component Value Date   VD25OH 35.70 12/26/2023   Assessment & Plan:   Problem List Items Addressed This Visit     Type 2 diabetes mellitus with neurological manifestations, controlled (HCC) - Primary   Chronic, great control on current regimen. Given age and recent falls, would err on high sugars in order to avoid hypoglycemia - therefore recommend he stop glipizide , continue metformin  500mg  bid.  Reassess control in another 3 months.       Relevant Orders   POCT glycosylated hemoglobin (Hb A1C) (Completed)   Vertigo   See prior note for details.  Chronic issue present for years.  Pending ENT eval - # provided for them to call and schedule appt.  Recommend sparing meclizine  use - he states this helps manage his vertigo spells.       Lesion of right ear   Skin lesion anterior to right ear of unclear etiology Refer to dermatology . Was unable to take picture today as I was unable to access Haiku in office today.       Relevant Orders   Ambulatory referral to Dermatology   Other Visit Diagnoses       Encounter for immunization       Relevant Orders   Flu vaccine HIGH DOSE PF(Fluzone Trivalent) (Completed)         Meds ordered this encounter  Medications   meclizine  (ANTIVERT ) 25 MG tablet    Sig: Take 0.5-1 tablets (12.5-25 mg total) by mouth 2 (two) times daily as needed for dizziness.    Dispense:  30 tablet    Refill:  0    Orders Placed This Encounter  Procedures   Flu vaccine HIGH DOSE PF(Fluzone Trivalent)   Ambulatory referral to Dermatology    Referral Priority:   Routine    Referral Type:   Consultation    Referral Reason:   Specialty Services Required    Requested Specialty:   Dermatology    Number of Visits Requested:   1   POCT glycosylated hemoglobin (Hb A1C)    Patient Instructions  Flu shot today  Sugars are doing well - stop glipizide  XL 5mg  for now. Continue metformin  500mg  twice daily.  You may call [facility] at [phone] to schedule appointment for evaluation of vertigo.  Work on dietary calcium intake, continue weekly vitamin D  and regular weight bearing exercises to keep bones strong.  I will order bone density scan - you may call: [facility] at [facility] [phone] to schedule bone density scan.  Schedule eye exam as you're due.  Schedule dermatology appointment Return in 3 months for diabetes follow up visit I will place new referral  to Dr Madeleine to evaluate right ear spot.   Follow up plan: Return in about 3 months (around 10/15/2024), or if symptoms worsen or fail to improve, for follow up visit.  Anton Blas, MD

## 2024-07-17 NOTE — Patient Instructions (Addendum)
 Flu shot today  Sugars are doing well - stop glipizide  XL 5mg  for now. Continue metformin  500mg  twice daily.  You may call [facility] at [phone] to schedule appointment for evaluation of vertigo.  Work on dietary calcium intake, continue weekly vitamin D  and regular weight bearing exercises to keep bones strong.  I will order bone density scan - you may call: [facility] at [facility] [phone] to schedule bone density scan.  Schedule eye exam as you're due.  Schedule dermatology appointment Return in 3 months for diabetes follow up visit I will place new referral  to Dr Madeleine to evaluate right ear spot.

## 2024-07-17 NOTE — Assessment & Plan Note (Addendum)
 Chronic, great control on current regimen. Given age and recent falls, would err on high sugars in order to avoid hypoglycemia - therefore recommend he stop glipizide , continue metformin  500mg  bid.  Reassess control in another 3 months.

## 2024-07-17 NOTE — Assessment & Plan Note (Addendum)
 See prior note for details.  Chronic issue present for years.  Pending ENT eval - # provided for them to call and schedule appt.  Recommend sparing meclizine  use - he states this helps manage his vertigo spells.

## 2024-07-17 NOTE — Assessment & Plan Note (Addendum)
 Skin lesion anterior to right ear of unclear etiology Refer to dermatology . Was unable to take picture today as I was unable to access Haiku in office today.

## 2024-07-19 DIAGNOSIS — E113393 Type 2 diabetes mellitus with moderate nonproliferative diabetic retinopathy without macular edema, bilateral: Secondary | ICD-10-CM | POA: Diagnosis not present

## 2024-07-25 ENCOUNTER — Telehealth: Payer: Self-pay | Admitting: Family Medicine

## 2024-07-25 NOTE — Telephone Encounter (Signed)
 Copied from CRM #[id number]. Topic: Referral - Status >> Jul 25, 2024  8:33 AM Tiffini S wrote: Reason for CRM:  Patient daughter Jeoffrey states the patient was seen last week, have fell in the past with two rib fractures and need a Bone Density Scan- need to schedule appointment   patient is very dizzy and off balance with movement - concerned with dropped sugar readings  She was able to schedule appointments with dermatology for right ear and  ENT  Please call the patient daughter Jeoffrey back to discuss at [phone]

## 2024-07-26 DIAGNOSIS — L720 Epidermal cyst: Secondary | ICD-10-CM | POA: Diagnosis not present

## 2024-07-26 DIAGNOSIS — L821 Other seborrheic keratosis: Secondary | ICD-10-CM | POA: Diagnosis not present

## 2024-07-26 DIAGNOSIS — L570 Actinic keratosis: Secondary | ICD-10-CM | POA: Diagnosis not present

## 2024-07-26 DIAGNOSIS — D485 Neoplasm of uncertain behavior of skin: Secondary | ICD-10-CM | POA: Diagnosis not present

## 2024-08-01 DIAGNOSIS — H903 Sensorineural hearing loss, bilateral: Secondary | ICD-10-CM | POA: Diagnosis not present

## 2024-08-01 DIAGNOSIS — H8112 Benign paroxysmal vertigo, left ear: Secondary | ICD-10-CM | POA: Diagnosis not present

## 2024-08-29 ENCOUNTER — Telehealth: Payer: Self-pay

## 2024-08-29 DIAGNOSIS — S3282XA Multiple fractures of pelvis without disruption of pelvic ring, initial encounter for closed fracture: Secondary | ICD-10-CM

## 2024-08-29 DIAGNOSIS — S2242XA Multiple fractures of ribs, left side, initial encounter for closed fracture: Secondary | ICD-10-CM

## 2024-08-29 DIAGNOSIS — N183 Chronic kidney disease, stage 3 unspecified: Secondary | ICD-10-CM

## 2024-08-29 NOTE — Telephone Encounter (Signed)
 Copied from CRM [id number]. Topic: Referral - Status >> Aug 29, 2024 11:03 AM Mercedes MATSU wrote: Reason for CRM: Patient is requesting a bone density scan referral to be sent to the [facility] location, it needs to be faxed to [phone]. No call back to the patient is needed.

## 2024-09-04 NOTE — Telephone Encounter (Signed)
 Unable to reach pt's daughter at this time, left voicemail instructing her to call back  Please provide message from provider/office when call is returned from patient.    I did get in contact with pt, he states I'm not gon do that

## 2024-09-04 NOTE — Addendum Note (Signed)
 Addended by: RILLA BALLER on: 09/04/2024 10:00 AM   Modules accepted: Orders

## 2024-09-04 NOTE — Telephone Encounter (Signed)
 I've opened an IT ticket.  Will let you know what I learn.

## 2024-09-04 NOTE — Telephone Encounter (Addendum)
 DEXA ordered. plz notify daughter Hospital Doctor.

## 2024-10-15 ENCOUNTER — Ambulatory Visit: Admitting: Family Medicine

## 2024-10-24 ENCOUNTER — Ambulatory Visit: Admitting: Family Medicine

## 2025-01-08 ENCOUNTER — Ambulatory Visit
